# Patient Record
Sex: Female | Born: 1940 | Race: White | Hispanic: No | Smoking: Current some day smoker
Health system: Southern US, Community
[De-identification: ages and names within clinical notes are randomized; demographics above are authoritative.]

## PROBLEM LIST (undated history)

## (undated) DIAGNOSIS — I10 Essential (primary) hypertension: Secondary | ICD-10-CM

## (undated) DIAGNOSIS — J189 Pneumonia, unspecified organism: Secondary | ICD-10-CM

## (undated) DIAGNOSIS — C439 Malignant melanoma of skin, unspecified: Secondary | ICD-10-CM

## (undated) DIAGNOSIS — G40909 Epilepsy, unspecified, not intractable, without status epilepticus: Secondary | ICD-10-CM

## (undated) DIAGNOSIS — I319 Disease of pericardium, unspecified: Secondary | ICD-10-CM

## (undated) DIAGNOSIS — F32A Depression, unspecified: Secondary | ICD-10-CM

## (undated) DIAGNOSIS — E785 Hyperlipidemia, unspecified: Secondary | ICD-10-CM

## (undated) DIAGNOSIS — C4371 Malignant melanoma of right lower limb, including hip: Secondary | ICD-10-CM

## (undated) DIAGNOSIS — R296 Repeated falls: Secondary | ICD-10-CM

## (undated) DIAGNOSIS — F329 Major depressive disorder, single episode, unspecified: Secondary | ICD-10-CM

## (undated) DIAGNOSIS — C799 Secondary malignant neoplasm of unspecified site: Secondary | ICD-10-CM

## (undated) DIAGNOSIS — C55 Malignant neoplasm of uterus, part unspecified: Secondary | ICD-10-CM

## (undated) DIAGNOSIS — K900 Celiac disease: Secondary | ICD-10-CM

## (undated) DIAGNOSIS — G43909 Migraine, unspecified, not intractable, without status migrainosus: Secondary | ICD-10-CM

## (undated) DIAGNOSIS — R42 Dizziness and giddiness: Secondary | ICD-10-CM

## (undated) DIAGNOSIS — K56609 Unspecified intestinal obstruction, unspecified as to partial versus complete obstruction: Secondary | ICD-10-CM

## (undated) DIAGNOSIS — G629 Polyneuropathy, unspecified: Secondary | ICD-10-CM

## (undated) DIAGNOSIS — G8929 Other chronic pain: Secondary | ICD-10-CM

## (undated) HISTORY — DX: Depression, unspecified: F32.A

## (undated) HISTORY — PX: CHOLECYSTECTOMY: SHX55

## (undated) HISTORY — PX: ABDOMINAL HYSTERECTOMY: SHX81

## (undated) HISTORY — PX: KIDNEY STONE SURGERY: SHX686

## (undated) HISTORY — DX: Essential (primary) hypertension: I10

## (undated) HISTORY — DX: Major depressive disorder, single episode, unspecified: F32.9

## (undated) HISTORY — PX: BLADDER REPAIR: SHX76

## (undated) HISTORY — DX: Repeated falls: R29.6

## (undated) HISTORY — DX: Epilepsy, unspecified, not intractable, without status epilepticus: G40.909

## (undated) HISTORY — DX: Unspecified intestinal obstruction, unspecified as to partial versus complete obstruction: K56.609

## (undated) HISTORY — DX: Hyperlipidemia, unspecified: E78.5

## (undated) HISTORY — PX: BACK SURGERY: SHX140

## (undated) HISTORY — DX: Malignant melanoma of skin, unspecified: C43.9

## (undated) HISTORY — PX: APPENDECTOMY: SHX54

## (undated) HISTORY — DX: Malignant melanoma of right lower limb, including hip: C43.71

## (undated) HISTORY — DX: Other chronic pain: G89.29

## (undated) HISTORY — DX: Malignant neoplasm of uterus, part unspecified: C55

## (undated) HISTORY — DX: Secondary malignant neoplasm of unspecified site: C79.9

---

## 1999-06-23 ENCOUNTER — Encounter: Payer: Self-pay | Admitting: Internal Medicine

## 2003-12-16 ENCOUNTER — Ambulatory Visit: Payer: Self-pay | Admitting: Anesthesiology

## 2004-01-13 ENCOUNTER — Ambulatory Visit: Payer: Self-pay | Admitting: Anesthesiology

## 2004-02-12 ENCOUNTER — Ambulatory Visit: Payer: Self-pay | Admitting: Anesthesiology

## 2004-03-16 ENCOUNTER — Ambulatory Visit: Payer: Self-pay | Admitting: Anesthesiology

## 2004-03-25 ENCOUNTER — Ambulatory Visit: Payer: Self-pay | Admitting: Internal Medicine

## 2004-04-12 ENCOUNTER — Ambulatory Visit: Payer: Self-pay | Admitting: Anesthesiology

## 2004-05-10 ENCOUNTER — Ambulatory Visit: Payer: Self-pay | Admitting: Anesthesiology

## 2004-05-26 ENCOUNTER — Ambulatory Visit: Payer: Self-pay | Admitting: Anesthesiology

## 2004-06-17 ENCOUNTER — Ambulatory Visit: Payer: Self-pay | Admitting: Anesthesiology

## 2004-07-05 ENCOUNTER — Ambulatory Visit: Payer: Self-pay | Admitting: Anesthesiology

## 2004-07-06 ENCOUNTER — Ambulatory Visit: Payer: Self-pay | Admitting: Internal Medicine

## 2004-08-04 ENCOUNTER — Ambulatory Visit: Payer: Self-pay | Admitting: Anesthesiology

## 2004-09-09 ENCOUNTER — Ambulatory Visit: Payer: Self-pay | Admitting: Anesthesiology

## 2004-10-06 ENCOUNTER — Ambulatory Visit: Payer: Self-pay | Admitting: Anesthesiology

## 2004-10-27 ENCOUNTER — Ambulatory Visit: Payer: Self-pay | Admitting: Anesthesiology

## 2004-11-23 ENCOUNTER — Ambulatory Visit: Payer: Self-pay | Admitting: Anesthesiology

## 2004-12-15 ENCOUNTER — Ambulatory Visit: Payer: Self-pay | Admitting: Anesthesiology

## 2005-01-18 ENCOUNTER — Ambulatory Visit: Payer: Self-pay | Admitting: Anesthesiology

## 2005-02-15 ENCOUNTER — Ambulatory Visit: Payer: Self-pay | Admitting: Anesthesiology

## 2005-03-01 ENCOUNTER — Ambulatory Visit: Payer: Self-pay | Admitting: Anesthesiology

## 2005-04-12 ENCOUNTER — Ambulatory Visit: Payer: Self-pay | Admitting: Anesthesiology

## 2005-05-04 ENCOUNTER — Ambulatory Visit: Payer: Self-pay | Admitting: Anesthesiology

## 2005-06-01 ENCOUNTER — Ambulatory Visit: Payer: Self-pay | Admitting: Anesthesiology

## 2005-06-23 ENCOUNTER — Ambulatory Visit: Payer: Self-pay | Admitting: Anesthesiology

## 2005-07-25 ENCOUNTER — Ambulatory Visit: Payer: Self-pay | Admitting: Anesthesiology

## 2005-08-12 ENCOUNTER — Ambulatory Visit: Payer: Self-pay | Admitting: Anesthesiology

## 2005-09-06 ENCOUNTER — Ambulatory Visit: Payer: Self-pay | Admitting: Anesthesiology

## 2005-09-29 ENCOUNTER — Ambulatory Visit: Payer: Self-pay | Admitting: Anesthesiology

## 2005-10-26 ENCOUNTER — Inpatient Hospital Stay: Payer: Self-pay | Admitting: Internal Medicine

## 2005-11-01 ENCOUNTER — Ambulatory Visit: Payer: Self-pay | Admitting: Anesthesiology

## 2005-11-30 ENCOUNTER — Ambulatory Visit: Payer: Self-pay | Admitting: Anesthesiology

## 2005-12-05 ENCOUNTER — Ambulatory Visit: Payer: Self-pay | Admitting: Internal Medicine

## 2005-12-20 ENCOUNTER — Ambulatory Visit: Payer: Self-pay | Admitting: Anesthesiology

## 2005-12-28 ENCOUNTER — Encounter: Payer: Self-pay | Admitting: Internal Medicine

## 2006-01-18 ENCOUNTER — Ambulatory Visit: Payer: Self-pay | Admitting: Anesthesiology

## 2006-02-06 ENCOUNTER — Ambulatory Visit: Payer: Self-pay | Admitting: Internal Medicine

## 2006-02-16 ENCOUNTER — Ambulatory Visit: Payer: Self-pay | Admitting: Anesthesiology

## 2006-02-28 ENCOUNTER — Ambulatory Visit: Payer: Self-pay | Admitting: Anesthesiology

## 2006-03-13 ENCOUNTER — Ambulatory Visit: Payer: Self-pay | Admitting: Family Medicine

## 2006-03-20 ENCOUNTER — Ambulatory Visit: Payer: Self-pay | Admitting: Internal Medicine

## 2006-03-22 ENCOUNTER — Encounter: Payer: Self-pay | Admitting: Internal Medicine

## 2006-04-06 ENCOUNTER — Ambulatory Visit: Payer: Self-pay | Admitting: Internal Medicine

## 2006-04-08 ENCOUNTER — Inpatient Hospital Stay: Payer: Self-pay | Admitting: Unknown Physician Specialty

## 2006-04-19 ENCOUNTER — Ambulatory Visit: Payer: Self-pay | Admitting: Anesthesiology

## 2006-04-27 ENCOUNTER — Ambulatory Visit: Payer: Self-pay | Admitting: Internal Medicine

## 2006-05-17 ENCOUNTER — Ambulatory Visit: Payer: Self-pay | Admitting: Anesthesiology

## 2006-05-31 ENCOUNTER — Ambulatory Visit: Payer: Self-pay | Admitting: Internal Medicine

## 2006-05-31 LAB — CONVERTED CEMR LAB
CO2: 31 meq/L (ref 19–32)
Cholesterol: 175 mg/dL (ref 0–200)
GFR calc Af Amer: 107 mL/min
Glucose, Bld: 101 mg/dL — ABNORMAL HIGH (ref 70–99)
HDL: 32.9 mg/dL — ABNORMAL LOW (ref >39.0)
Phosphorus: 4.1 mg/dL (ref 2.3–4.6)
Potassium: 4.3 meq/L (ref 3.5–5.1)
Sodium: 141 meq/L (ref 135–145)
Total CHOL/HDL Ratio: 5.3
Triglycerides: 260 mg/dL (ref 0–149)
VLDL: 52 mg/dL — ABNORMAL HIGH (ref 0–40)

## 2006-06-13 ENCOUNTER — Ambulatory Visit: Payer: Self-pay | Admitting: Anesthesiology

## 2006-06-28 ENCOUNTER — Ambulatory Visit: Payer: Self-pay | Admitting: Anesthesiology

## 2006-08-08 ENCOUNTER — Ambulatory Visit: Payer: Self-pay | Admitting: Anesthesiology

## 2006-08-08 ENCOUNTER — Encounter: Payer: Self-pay | Admitting: Internal Medicine

## 2006-08-29 DIAGNOSIS — Z8542 Personal history of malignant neoplasm of other parts of uterus: Secondary | ICD-10-CM | POA: Insufficient documentation

## 2006-08-29 DIAGNOSIS — I10 Essential (primary) hypertension: Secondary | ICD-10-CM

## 2006-08-29 DIAGNOSIS — E785 Hyperlipidemia, unspecified: Secondary | ICD-10-CM

## 2006-08-29 DIAGNOSIS — F39 Unspecified mood [affective] disorder: Secondary | ICD-10-CM

## 2006-08-29 DIAGNOSIS — G8929 Other chronic pain: Secondary | ICD-10-CM | POA: Insufficient documentation

## 2006-08-29 DIAGNOSIS — M810 Age-related osteoporosis without current pathological fracture: Secondary | ICD-10-CM

## 2006-09-02 DIAGNOSIS — E1142 Type 2 diabetes mellitus with diabetic polyneuropathy: Secondary | ICD-10-CM

## 2006-09-05 ENCOUNTER — Encounter: Payer: Self-pay | Admitting: Internal Medicine

## 2006-09-05 ENCOUNTER — Ambulatory Visit: Payer: Self-pay | Admitting: Anesthesiology

## 2006-09-13 ENCOUNTER — Ambulatory Visit: Payer: Self-pay | Admitting: Internal Medicine

## 2006-10-02 ENCOUNTER — Encounter: Payer: Self-pay | Admitting: Internal Medicine

## 2006-10-02 ENCOUNTER — Ambulatory Visit: Payer: Self-pay | Admitting: Anesthesiology

## 2006-11-02 ENCOUNTER — Ambulatory Visit: Payer: Self-pay | Admitting: Anesthesiology

## 2006-11-02 ENCOUNTER — Encounter: Payer: Self-pay | Admitting: Internal Medicine

## 2006-11-15 ENCOUNTER — Encounter: Payer: Self-pay | Admitting: Internal Medicine

## 2006-11-15 ENCOUNTER — Ambulatory Visit: Payer: Self-pay | Admitting: Anesthesiology

## 2006-12-22 ENCOUNTER — Ambulatory Visit: Payer: Self-pay | Admitting: Anesthesiology

## 2006-12-26 ENCOUNTER — Encounter: Payer: Self-pay | Admitting: Internal Medicine

## 2007-01-22 ENCOUNTER — Encounter: Payer: Self-pay | Admitting: Internal Medicine

## 2007-01-22 ENCOUNTER — Ambulatory Visit: Payer: Self-pay | Admitting: Anesthesiology

## 2007-02-20 ENCOUNTER — Ambulatory Visit: Payer: Self-pay | Admitting: Anesthesiology

## 2007-02-20 ENCOUNTER — Encounter: Payer: Self-pay | Admitting: Internal Medicine

## 2007-03-19 ENCOUNTER — Encounter: Payer: Self-pay | Admitting: Internal Medicine

## 2007-03-19 ENCOUNTER — Ambulatory Visit: Payer: Self-pay | Admitting: Anesthesiology

## 2007-04-17 ENCOUNTER — Ambulatory Visit: Payer: Self-pay | Admitting: Anesthesiology

## 2007-04-17 ENCOUNTER — Encounter: Payer: Self-pay | Admitting: Internal Medicine

## 2007-05-03 ENCOUNTER — Ambulatory Visit: Payer: Self-pay | Admitting: Internal Medicine

## 2007-05-03 LAB — CONVERTED CEMR LAB
Blood in Urine, dipstick: NEGATIVE
Glucose, Urine, Semiquant: NEGATIVE
Ketones, urine, test strip: NEGATIVE
Nitrite: POSITIVE

## 2007-05-04 LAB — CONVERTED CEMR LAB
ALT: 33 units/L (ref 0–35)
Albumin: 4.1 g/dL (ref 3.5–5.2)
Alkaline Phosphatase: 105 units/L (ref 39–117)
Basophils Relative: 0.4 % (ref 0.0–1.0)
CO2: 30 meq/L (ref 19–32)
Calcium: 10.2 mg/dL (ref 8.4–10.5)
Chloride: 95 meq/L — ABNORMAL LOW (ref 96–112)
Cholesterol: 193 mg/dL (ref 0–200)
Direct LDL: 85.8 mg/dL
Eosinophils Absolute: 0.3 10*3/uL (ref 0.0–0.6)
Eosinophils Relative: 2.3 % (ref 0.0–5.0)
Glucose, Bld: 178 mg/dL — ABNORMAL HIGH (ref 70–99)
HDL: 30.4 mg/dL — ABNORMAL LOW (ref >39.0)
Hgb A1c MFr Bld: 7.1 % — ABNORMAL HIGH (ref 4.6–6.0)
Lymphocytes Relative: 28.1 % (ref 12.0–46.0)
MCV: 94.2 fL (ref 78.0–100.0)
Microalb Creat Ratio: 63.6 mg/g — ABNORMAL HIGH (ref 0.0–30.0)
Microalb, Ur: 8 mg/dL — ABNORMAL HIGH (ref 0.0–1.9)
Monocytes Relative: 6.8 % (ref 3.0–11.0)
Neutro Abs: 7 10*3/uL (ref 1.4–7.7)
Platelets: 323 10*3/uL (ref 150–400)
RBC: 4.74 M/uL (ref 3.87–5.11)
Sodium: 136 meq/L (ref 135–145)
TSH: 1.99 microintl units/mL (ref 0.35–5.50)
Total Protein: 7.8 g/dL (ref 6.0–8.3)
Triglycerides: 533 mg/dL (ref 0–149)
VLDL: 107 mg/dL — ABNORMAL HIGH (ref 0–40)
WBC: 11.3 10*3/uL — ABNORMAL HIGH (ref 4.5–10.5)

## 2007-05-14 ENCOUNTER — Ambulatory Visit: Payer: Self-pay | Admitting: Anesthesiology

## 2007-05-14 ENCOUNTER — Encounter: Payer: Self-pay | Admitting: Internal Medicine

## 2007-05-17 ENCOUNTER — Telehealth (INDEPENDENT_AMBULATORY_CARE_PROVIDER_SITE_OTHER): Payer: Self-pay | Admitting: *Deleted

## 2007-05-17 ENCOUNTER — Ambulatory Visit: Payer: Self-pay | Admitting: Unknown Physician Specialty

## 2007-05-23 ENCOUNTER — Telehealth: Payer: Self-pay | Admitting: Internal Medicine

## 2007-05-29 ENCOUNTER — Encounter: Payer: Self-pay | Admitting: Internal Medicine

## 2007-06-11 ENCOUNTER — Ambulatory Visit: Payer: Self-pay | Admitting: Anesthesiology

## 2007-06-11 ENCOUNTER — Encounter: Payer: Self-pay | Admitting: Internal Medicine

## 2007-06-29 ENCOUNTER — Telehealth: Payer: Self-pay | Admitting: Family Medicine

## 2007-07-04 ENCOUNTER — Encounter: Payer: Self-pay | Admitting: Internal Medicine

## 2007-07-04 ENCOUNTER — Ambulatory Visit: Payer: Self-pay | Admitting: Anesthesiology

## 2007-07-26 ENCOUNTER — Telehealth (INDEPENDENT_AMBULATORY_CARE_PROVIDER_SITE_OTHER): Payer: Self-pay | Admitting: *Deleted

## 2007-08-01 ENCOUNTER — Ambulatory Visit: Payer: Self-pay | Admitting: Anesthesiology

## 2007-08-01 ENCOUNTER — Encounter: Payer: Self-pay | Admitting: Internal Medicine

## 2007-08-28 ENCOUNTER — Ambulatory Visit: Payer: Self-pay | Admitting: Anesthesiology

## 2007-09-27 ENCOUNTER — Encounter: Payer: Self-pay | Admitting: Internal Medicine

## 2007-09-27 ENCOUNTER — Ambulatory Visit: Payer: Self-pay | Admitting: Anesthesiology

## 2007-10-22 ENCOUNTER — Encounter: Payer: Self-pay | Admitting: Internal Medicine

## 2007-10-22 ENCOUNTER — Ambulatory Visit: Payer: Self-pay | Admitting: Anesthesiology

## 2007-11-05 ENCOUNTER — Ambulatory Visit: Payer: Self-pay | Admitting: Internal Medicine

## 2007-11-05 DIAGNOSIS — R0609 Other forms of dyspnea: Secondary | ICD-10-CM

## 2007-11-05 DIAGNOSIS — R0989 Other specified symptoms and signs involving the circulatory and respiratory systems: Secondary | ICD-10-CM

## 2007-11-08 ENCOUNTER — Ambulatory Visit: Payer: Self-pay | Admitting: Cardiovascular Disease

## 2007-11-08 LAB — CONVERTED CEMR LAB
ALT: 32 U/L (ref 0–35)
AST: 29 U/L (ref 0–37)
Albumin: 4.3 g/dL (ref 3.5–5.2)
Alkaline Phosphatase: 92 U/L (ref 39–117)
BUN: 18 mg/dL (ref 6–23)
Basophils Absolute: 0 K/uL (ref 0.0–0.1)
Basophils Relative: 0.3 % (ref 0.0–3.0)
Bilirubin, Direct: 0.1 mg/dL (ref 0.0–0.3)
CO2: 32 meq/L (ref 19–32)
Calcium: 10 mg/dL (ref 8.4–10.5)
Chloride: 105 meq/L (ref 96–112)
Cholesterol: 227 mg/dL (ref 0–200)
Creatinine, Ser: 0.7 mg/dL (ref 0.4–1.2)
Direct LDL: 68.8 mg/dL
Eosinophils Absolute: 0.3 K/uL (ref 0.0–0.7)
Eosinophils Relative: 2.5 % (ref 0.0–5.0)
Free T4: 0.6 ng/dL (ref 0.6–1.6)
GFR calc Af Amer: 107 mL/min
GFR calc non Af Amer: 88 mL/min
Glucose, Bld: 150 mg/dL — ABNORMAL HIGH (ref 70–99)
HCT: 40.3 % (ref 36.0–46.0)
HDL: 33.1 mg/dL — ABNORMAL LOW (ref >39.0)
Hemoglobin: 14 g/dL (ref 12.0–15.0)
Hgb A1c MFr Bld: 8.2 % — ABNORMAL HIGH (ref 4.6–6.0)
Lymphocytes Relative: 32.4 % (ref 12.0–46.0)
MCHC: 34.9 g/dL (ref 30.0–36.0)
MCV: 93.6 fL (ref 78.0–100.0)
Monocytes Absolute: 0.5 K/uL (ref 0.1–1.0)
Monocytes Relative: 5 % (ref 3.0–12.0)
Neutro Abs: 6.6 K/uL (ref 1.4–7.7)
Neutrophils Relative %: 59.8 % (ref 43.0–77.0)
Phosphorus: 4.8 mg/dL — ABNORMAL HIGH (ref 2.3–4.6)
Platelets: 293 K/uL (ref 150–400)
Potassium: 4.9 meq/L (ref 3.5–5.1)
RBC: 4.3 M/uL (ref 3.87–5.11)
RDW: 12 % (ref 11.5–14.6)
Sodium: 140 meq/L (ref 135–145)
TSH: 2.85 u[IU]/mL (ref 0.35–5.50)
Total Bilirubin: 0.6 mg/dL (ref 0.3–1.2)
Total CHOL/HDL Ratio: 6.9
Total Protein: 7.6 g/dL (ref 6.0–8.3)
Triglycerides: 724 mg/dL (ref 0–149)
VLDL: 145 mg/dL — ABNORMAL HIGH (ref 0–40)
WBC: 10.9 10*3/microliter — ABNORMAL HIGH (ref 4.5–10.5)

## 2007-11-14 ENCOUNTER — Encounter: Payer: Self-pay | Admitting: Cardiovascular Disease

## 2007-11-14 ENCOUNTER — Ambulatory Visit: Payer: Self-pay

## 2007-11-16 ENCOUNTER — Ambulatory Visit: Payer: Self-pay | Admitting: Anesthesiology

## 2007-11-16 ENCOUNTER — Encounter: Payer: Self-pay | Admitting: Internal Medicine

## 2007-12-18 ENCOUNTER — Ambulatory Visit: Payer: Self-pay | Admitting: Anesthesiology

## 2007-12-18 ENCOUNTER — Encounter: Payer: Self-pay | Admitting: Internal Medicine

## 2008-01-15 ENCOUNTER — Ambulatory Visit: Payer: Self-pay | Admitting: Anesthesiology

## 2008-01-15 ENCOUNTER — Encounter: Payer: Self-pay | Admitting: Internal Medicine

## 2008-02-04 ENCOUNTER — Telehealth (INDEPENDENT_AMBULATORY_CARE_PROVIDER_SITE_OTHER): Payer: Self-pay | Admitting: *Deleted

## 2008-02-11 ENCOUNTER — Ambulatory Visit: Payer: Self-pay | Admitting: Anesthesiology

## 2008-02-12 ENCOUNTER — Encounter: Payer: Self-pay | Admitting: Internal Medicine

## 2008-02-26 ENCOUNTER — Telehealth: Payer: Self-pay | Admitting: Family Medicine

## 2008-03-05 ENCOUNTER — Ambulatory Visit: Payer: Self-pay | Admitting: Anesthesiology

## 2008-03-08 ENCOUNTER — Telehealth: Payer: Self-pay | Admitting: Family Medicine

## 2008-03-12 ENCOUNTER — Ambulatory Visit: Payer: Self-pay | Admitting: Internal Medicine

## 2008-04-03 ENCOUNTER — Encounter: Payer: Self-pay | Admitting: Internal Medicine

## 2008-04-03 ENCOUNTER — Ambulatory Visit: Payer: Self-pay | Admitting: Anesthesiology

## 2008-04-11 ENCOUNTER — Inpatient Hospital Stay: Payer: Self-pay | Admitting: Internal Medicine

## 2008-04-11 ENCOUNTER — Encounter: Payer: Self-pay | Admitting: Internal Medicine

## 2008-04-15 ENCOUNTER — Encounter: Payer: Self-pay | Admitting: Internal Medicine

## 2008-04-23 ENCOUNTER — Encounter: Payer: Self-pay | Admitting: Internal Medicine

## 2008-04-23 ENCOUNTER — Telehealth: Payer: Self-pay | Admitting: Internal Medicine

## 2008-05-13 ENCOUNTER — Ambulatory Visit: Payer: Self-pay | Admitting: Internal Medicine

## 2008-05-22 ENCOUNTER — Encounter: Payer: Self-pay | Admitting: Internal Medicine

## 2008-05-22 ENCOUNTER — Ambulatory Visit: Payer: Self-pay | Admitting: Anesthesiology

## 2008-06-09 ENCOUNTER — Telehealth: Payer: Self-pay | Admitting: Internal Medicine

## 2008-06-16 ENCOUNTER — Ambulatory Visit: Payer: Self-pay | Admitting: Internal Medicine

## 2008-06-16 DIAGNOSIS — L0201 Cutaneous abscess of face: Secondary | ICD-10-CM

## 2008-06-16 DIAGNOSIS — L03211 Cellulitis of face: Secondary | ICD-10-CM

## 2008-06-24 ENCOUNTER — Encounter: Payer: Self-pay | Admitting: Internal Medicine

## 2008-06-24 ENCOUNTER — Ambulatory Visit: Payer: Self-pay | Admitting: Anesthesiology

## 2008-06-25 ENCOUNTER — Encounter: Payer: Self-pay | Admitting: Cardiovascular Disease

## 2008-06-25 ENCOUNTER — Encounter: Payer: Self-pay | Admitting: Internal Medicine

## 2008-06-27 ENCOUNTER — Encounter: Payer: Self-pay | Admitting: Internal Medicine

## 2008-07-01 ENCOUNTER — Ambulatory Visit: Payer: Self-pay | Admitting: Unknown Physician Specialty

## 2008-07-15 ENCOUNTER — Telehealth: Payer: Self-pay | Admitting: Internal Medicine

## 2008-07-16 ENCOUNTER — Encounter: Payer: Self-pay | Admitting: Internal Medicine

## 2008-07-16 ENCOUNTER — Ambulatory Visit: Payer: Self-pay | Admitting: Anesthesiology

## 2008-07-29 ENCOUNTER — Encounter: Payer: Self-pay | Admitting: Internal Medicine

## 2008-08-21 ENCOUNTER — Ambulatory Visit: Payer: Self-pay | Admitting: Anesthesiology

## 2008-08-21 ENCOUNTER — Encounter: Payer: Self-pay | Admitting: Internal Medicine

## 2008-08-25 ENCOUNTER — Telehealth: Payer: Self-pay | Admitting: Internal Medicine

## 2008-09-25 ENCOUNTER — Telehealth: Payer: Self-pay | Admitting: Internal Medicine

## 2008-10-14 ENCOUNTER — Encounter: Payer: Self-pay | Admitting: Internal Medicine

## 2008-10-14 ENCOUNTER — Ambulatory Visit: Payer: Self-pay | Admitting: Gastroenterology

## 2008-10-21 ENCOUNTER — Ambulatory Visit: Payer: Self-pay | Admitting: Anesthesiology

## 2008-10-21 ENCOUNTER — Encounter: Payer: Self-pay | Admitting: Internal Medicine

## 2008-10-22 ENCOUNTER — Ambulatory Visit: Payer: Self-pay | Admitting: Anesthesiology

## 2008-10-23 ENCOUNTER — Ambulatory Visit: Payer: Self-pay | Admitting: Cardiology

## 2008-10-23 ENCOUNTER — Inpatient Hospital Stay: Payer: Self-pay | Admitting: Vascular Surgery

## 2008-10-27 ENCOUNTER — Telehealth: Payer: Self-pay | Admitting: Internal Medicine

## 2008-10-28 ENCOUNTER — Encounter: Payer: Self-pay | Admitting: Internal Medicine

## 2008-10-30 ENCOUNTER — Encounter: Payer: Self-pay | Admitting: Internal Medicine

## 2008-10-30 ENCOUNTER — Other Ambulatory Visit: Payer: Self-pay

## 2008-11-12 ENCOUNTER — Ambulatory Visit: Payer: Self-pay | Admitting: Internal Medicine

## 2008-11-20 ENCOUNTER — Encounter: Payer: Self-pay | Admitting: Internal Medicine

## 2008-12-12 ENCOUNTER — Ambulatory Visit: Payer: Self-pay | Admitting: Internal Medicine

## 2009-01-01 ENCOUNTER — Ambulatory Visit: Payer: Self-pay | Admitting: Internal Medicine

## 2009-01-01 DIAGNOSIS — R42 Dizziness and giddiness: Secondary | ICD-10-CM

## 2009-02-12 ENCOUNTER — Encounter: Payer: Self-pay | Admitting: Internal Medicine

## 2009-02-18 ENCOUNTER — Ambulatory Visit: Payer: Self-pay | Admitting: Anesthesiology

## 2009-03-04 ENCOUNTER — Telehealth: Payer: Self-pay | Admitting: Internal Medicine

## 2009-03-05 ENCOUNTER — Telehealth: Payer: Self-pay | Admitting: Internal Medicine

## 2009-03-05 ENCOUNTER — Ambulatory Visit: Payer: Self-pay | Admitting: Internal Medicine

## 2009-03-17 ENCOUNTER — Telehealth: Payer: Self-pay | Admitting: Family Medicine

## 2009-04-02 ENCOUNTER — Ambulatory Visit: Payer: Self-pay | Admitting: Anesthesiology

## 2009-04-02 ENCOUNTER — Encounter: Payer: Self-pay | Admitting: Internal Medicine

## 2009-04-22 ENCOUNTER — Encounter: Payer: Self-pay | Admitting: Internal Medicine

## 2009-04-29 ENCOUNTER — Telehealth: Payer: Self-pay | Admitting: Internal Medicine

## 2009-05-11 ENCOUNTER — Ambulatory Visit: Payer: Self-pay | Admitting: Internal Medicine

## 2009-05-12 LAB — CONVERTED CEMR LAB
Albumin: 4.3 g/dL (ref 3.5–5.2)
Basophils Relative: 0.8 % (ref 0.0–3.0)
CO2: 30 meq/L (ref 19–32)
Chloride: 106 meq/L (ref 96–112)
Cholesterol: 146 mg/dL (ref 0–200)
Direct LDL: 85.6 mg/dL
Eosinophils Relative: 4.1 % (ref 0.0–5.0)
HCT: 39.9 % (ref 36.0–46.0)
Hemoglobin: 13.2 g/dL (ref 12.0–15.0)
Lymphs Abs: 2.7 10*3/uL (ref 0.7–4.0)
MCV: 95.2 fL (ref 78.0–100.0)
Monocytes Absolute: 0.6 10*3/uL (ref 0.1–1.0)
Neutro Abs: 4.8 10*3/uL (ref 1.4–7.7)
RBC: 4.19 M/uL (ref 3.87–5.11)
Sodium: 140 meq/L (ref 135–145)
Total Bilirubin: 0.3 mg/dL (ref 0.3–1.2)
Total CHOL/HDL Ratio: 3
Triglycerides: 208 mg/dL — ABNORMAL HIGH (ref 0.0–149.0)
VLDL: 41.6 mg/dL — ABNORMAL HIGH (ref 0.0–40.0)
WBC: 8.5 10*3/uL (ref 4.5–10.5)

## 2009-05-20 ENCOUNTER — Ambulatory Visit: Payer: Self-pay | Admitting: Anesthesiology

## 2009-05-20 ENCOUNTER — Encounter: Payer: Self-pay | Admitting: Internal Medicine

## 2009-05-27 ENCOUNTER — Telehealth: Payer: Self-pay | Admitting: Internal Medicine

## 2009-06-08 ENCOUNTER — Telehealth: Payer: Self-pay | Admitting: Internal Medicine

## 2009-06-10 ENCOUNTER — Telehealth: Payer: Self-pay | Admitting: Internal Medicine

## 2009-07-07 ENCOUNTER — Ambulatory Visit: Payer: Self-pay | Admitting: Unknown Physician Specialty

## 2009-07-09 ENCOUNTER — Ambulatory Visit: Payer: Self-pay | Admitting: Anesthesiology

## 2009-07-09 ENCOUNTER — Encounter: Payer: Self-pay | Admitting: Internal Medicine

## 2009-08-25 ENCOUNTER — Telehealth: Payer: Self-pay | Admitting: Internal Medicine

## 2009-08-31 ENCOUNTER — Encounter: Payer: Self-pay | Admitting: Internal Medicine

## 2009-08-31 ENCOUNTER — Telehealth: Payer: Self-pay | Admitting: Internal Medicine

## 2009-09-21 ENCOUNTER — Ambulatory Visit: Payer: Self-pay | Admitting: Anesthesiology

## 2009-09-21 ENCOUNTER — Encounter: Payer: Self-pay | Admitting: Internal Medicine

## 2009-11-12 ENCOUNTER — Ambulatory Visit: Payer: Self-pay | Admitting: Internal Medicine

## 2009-11-18 LAB — CONVERTED CEMR LAB
BUN: 17 mg/dL (ref 6–23)
CO2: 29 meq/L (ref 19–32)
Chloride: 98 meq/L (ref 96–112)
GFR calc non Af Amer: 63.97 mL/min (ref >60)

## 2009-11-24 ENCOUNTER — Encounter: Payer: Self-pay | Admitting: Internal Medicine

## 2009-11-24 ENCOUNTER — Ambulatory Visit: Payer: Self-pay | Admitting: Anesthesiology

## 2010-01-12 ENCOUNTER — Telehealth: Payer: Self-pay | Admitting: Internal Medicine

## 2010-01-18 ENCOUNTER — Encounter: Payer: Self-pay | Admitting: Internal Medicine

## 2010-01-18 ENCOUNTER — Ambulatory Visit: Payer: Self-pay | Admitting: Anesthesiology

## 2010-01-29 ENCOUNTER — Ambulatory Visit: Payer: Self-pay | Admitting: Internal Medicine

## 2010-01-29 LAB — HM DIABETES FOOT EXAM

## 2010-02-01 LAB — CONVERTED CEMR LAB: Hgb A1c MFr Bld: 6.5 % (ref 4.6–6.5)

## 2010-02-03 ENCOUNTER — Ambulatory Visit: Payer: Self-pay | Admitting: Ophthalmology

## 2010-02-17 ENCOUNTER — Ambulatory Visit: Payer: Self-pay | Admitting: Ophthalmology

## 2010-03-01 IMAGING — CT CT HEAD WITHOUT CONTRAST
4 of 6 series · 16 of 30 positions shown, 17 images · non-contrast
Comparison: none

REASON FOR EXAM: altered mental status
COMMENTS:

PROCEDURE:     CT  - CT HEAD WITHOUT CONTRAST  - November 15, 2008  [DATE]
RESULT:     Comparison:  None
TECHNIQUE: Multiple axial images from the foramen magnum to the vertex were
obtained without IV contrast.

[Series 2: without · axial · non-contrast · 0.41mm/px · z∈[-162,-72]mm · 4 of 30 slices shown, 5 images (1 of 2)]
[im 6/30  brain]
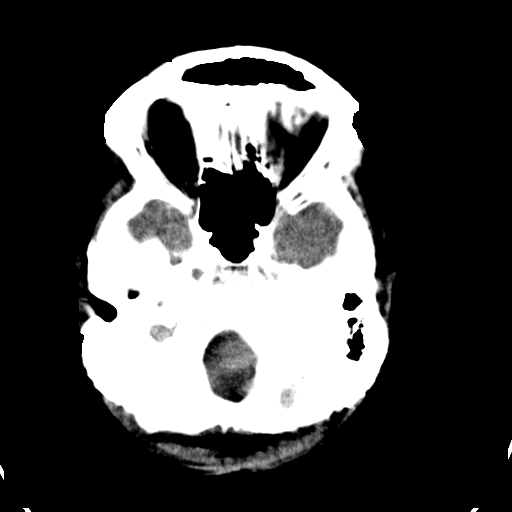
[im 6/30  bone]
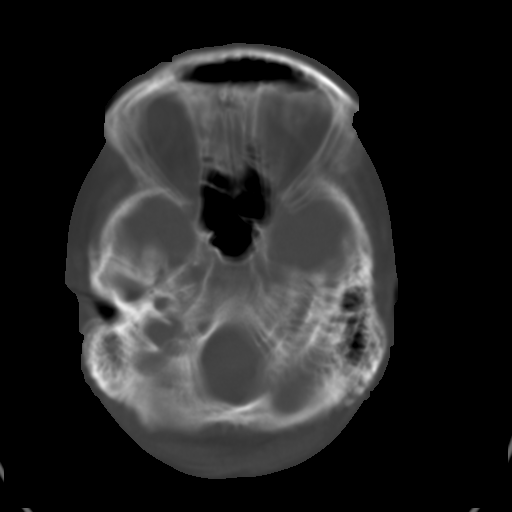
[im 12/30  brain]
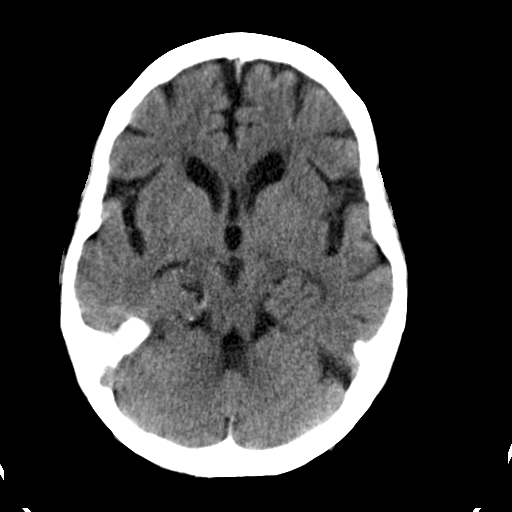
[im 18/30  brain]
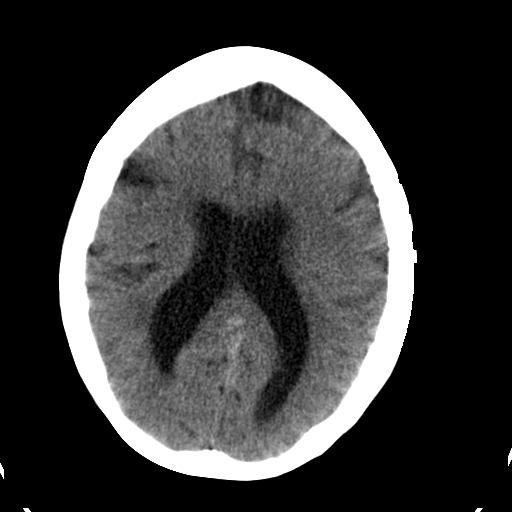
[im 24/30  brain]
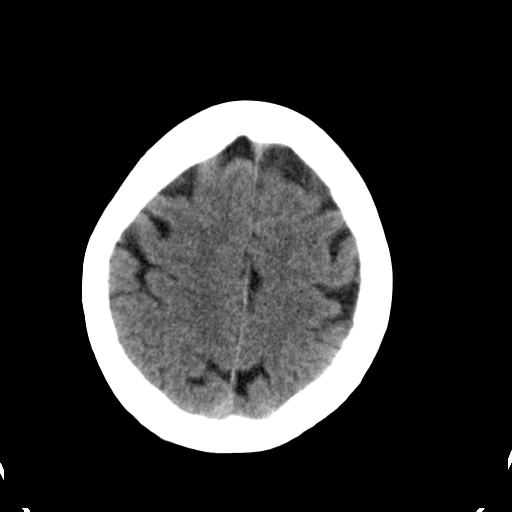

[Series 3: bone · axial · 0.41mm/px · z∈[-162,-72]mm · 4 of 30 slices shown (1 of 2)]
[im 6/30  bone]
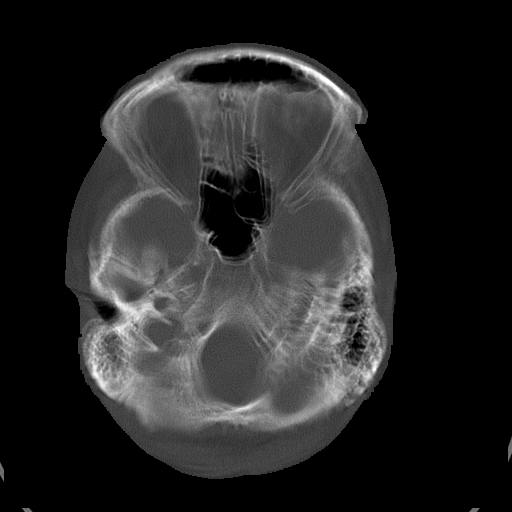
[im 12/30  bone]
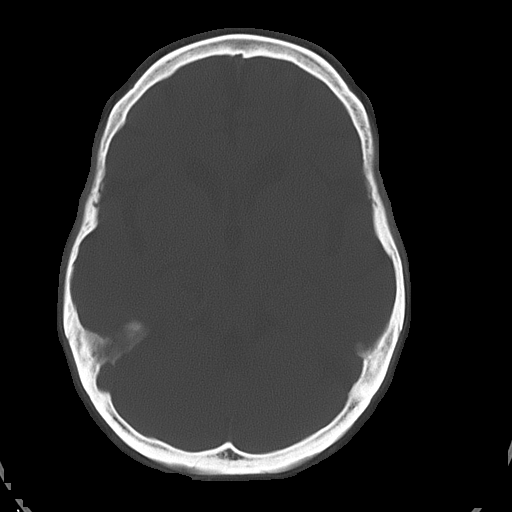
[im 18/30  bone]
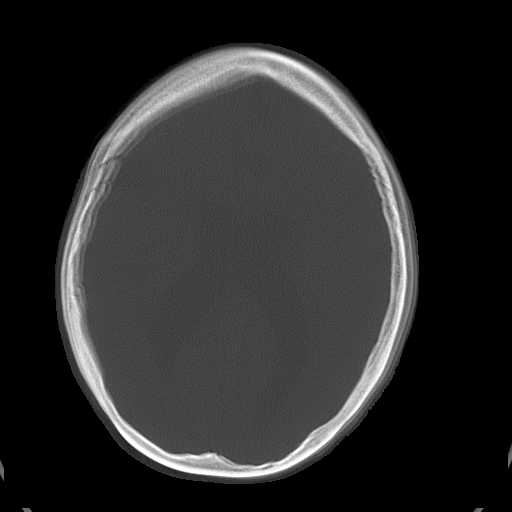
[im 24/30  bone]
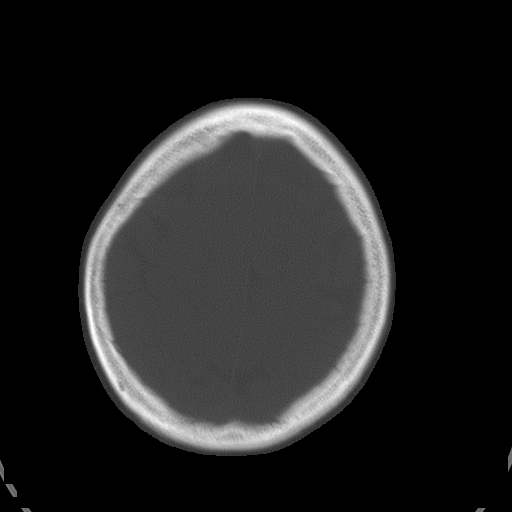

[Series 4: without · axial · non-contrast · 0.41mm/px · z∈[-162,-72]mm · 4 of 30 slices shown (2 of 2)]
[im 6/30  brain]
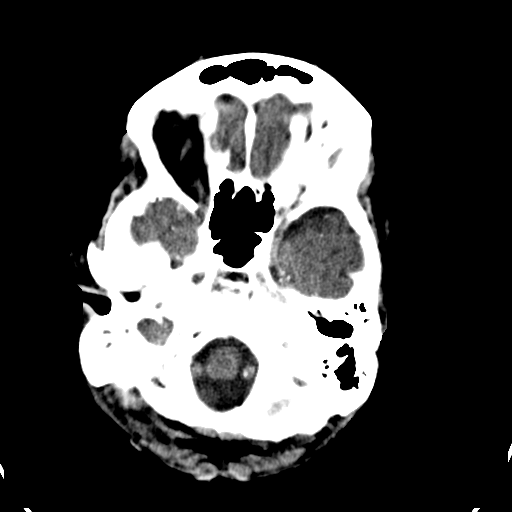
[im 12/30  brain]
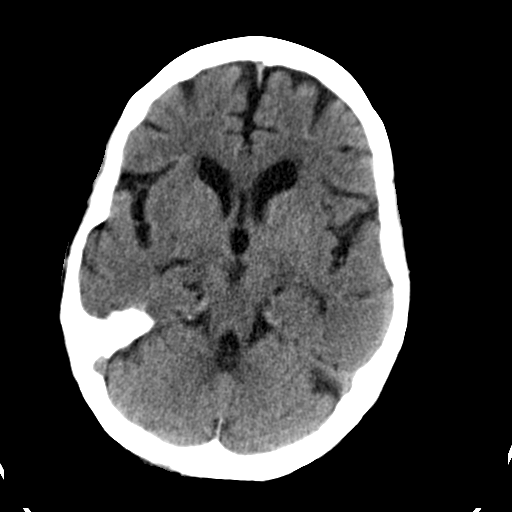
[im 18/30  brain]
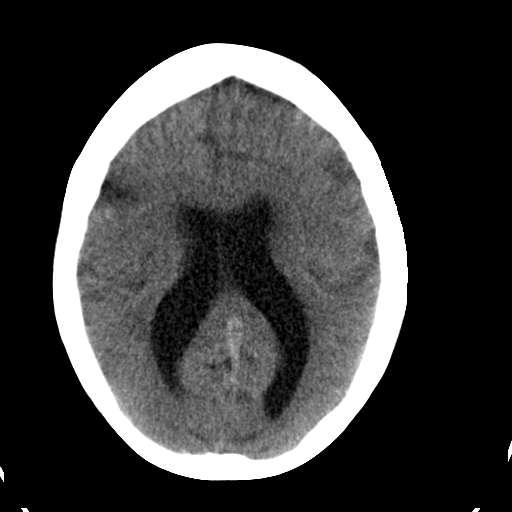
[im 24/30  brain]
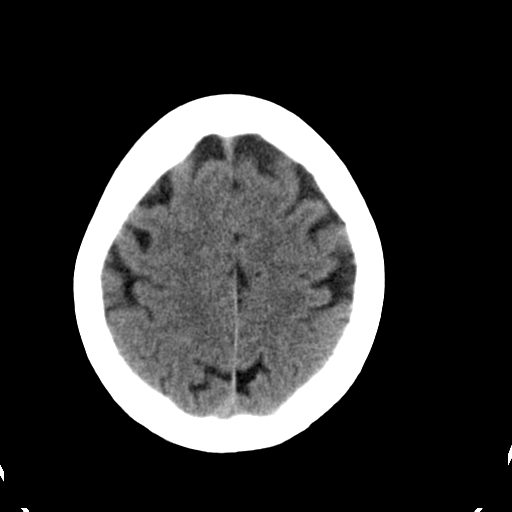

[Series 5: bone · axial · 0.41mm/px · z∈[-162,-72]mm · 4 of 30 slices shown (2 of 2)]
[im 6/30  bone]
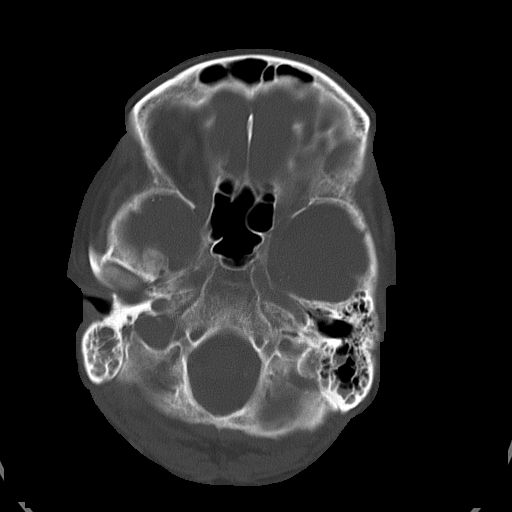
[im 12/30  bone]
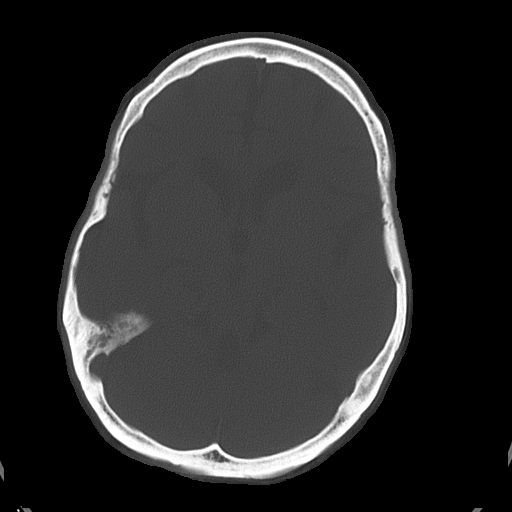
[im 18/30  bone]
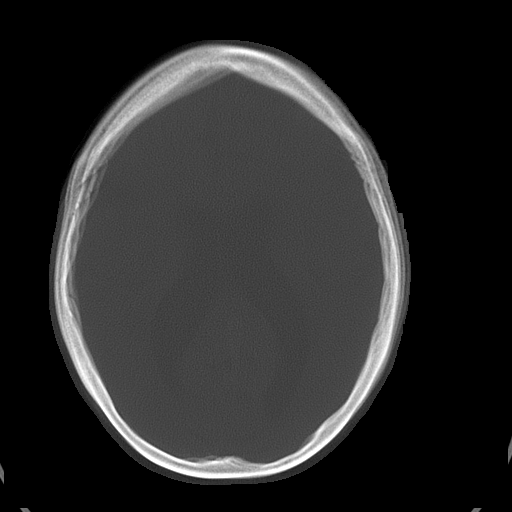
[im 24/30  bone]
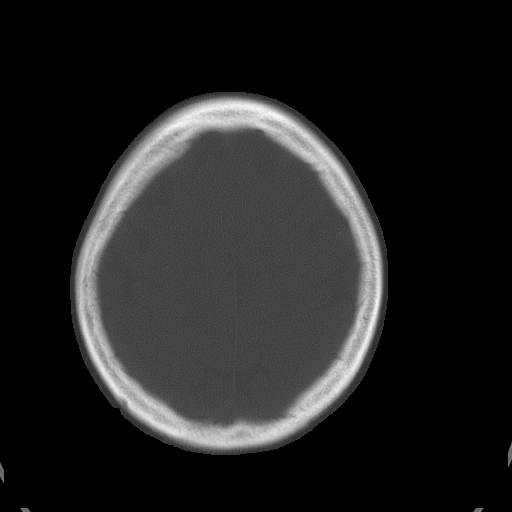

[16 of 30 positions shown; findings below may reference images not displayed]

FINDINGS: Evaluation is limited secondary to patient motion.

There is no evidence of mass effect, midline shift, or extra-axial fluid
collections.  There is no evidence of a space-occupying lesion or
intracranial hemorrhage. There is no evidence of a cortical-based area of
acute infarction. There is generalized cerebral atrophy. There is
periventricular white matter low attenuation likely secondary to
microangiopathy.

The ventricles and sulci are appropriate for the patient's age. The basal
cisterns are patent.

Visualized portions of the orbits are unremarkable. There is complete
opacification of the right mastoid sinus.  Cerebrovascular atherosclerotic
calcifications are noted.

The osseous structures are unremarkable.
IMPRESSION: No acute intracranial process.

There is complete opacification of the right mastoid sinus. Correlate with
symptoms of mastoiditis.

## 2010-03-23 ENCOUNTER — Encounter: Payer: Self-pay | Admitting: Internal Medicine

## 2010-03-23 ENCOUNTER — Ambulatory Visit: Payer: Self-pay | Admitting: Anesthesiology

## 2010-04-07 ENCOUNTER — Inpatient Hospital Stay: Payer: Self-pay | Admitting: Specialist

## 2010-04-07 ENCOUNTER — Telehealth: Payer: Self-pay | Admitting: Internal Medicine

## 2010-04-07 ENCOUNTER — Ambulatory Visit: Admit: 2010-04-07 | Payer: Self-pay | Admitting: Internal Medicine

## 2010-04-07 ENCOUNTER — Encounter: Payer: Self-pay | Admitting: Internal Medicine

## 2010-04-08 ENCOUNTER — Telehealth: Payer: Self-pay | Admitting: Internal Medicine

## 2010-04-13 DIAGNOSIS — M549 Dorsalgia, unspecified: Secondary | ICD-10-CM

## 2010-04-13 DIAGNOSIS — N390 Urinary tract infection, site not specified: Secondary | ICD-10-CM

## 2010-04-13 DIAGNOSIS — F329 Major depressive disorder, single episode, unspecified: Secondary | ICD-10-CM

## 2010-04-13 DIAGNOSIS — J189 Pneumonia, unspecified organism: Secondary | ICD-10-CM

## 2010-04-14 NOTE — Letter (Signed)
Summary: [facility]   Imported By: Edmonia James 07/21/2009 11:53:37  _____________________________________________________________________  External Attachment:    Type:   Image     Comment:   External Document  Appended Document: [place] doing well No changes

## 2010-04-14 NOTE — Progress Notes (Signed)
Summary: refill request for diazepam  Phone Note Refill Request Message from:  Fax from Pharmacy  Refills Requested: Medication #1:  DIAZEPAM 5 MG TABS take 1-2 by mouth as needed   Last Refilled: 04/29/2009 [facility] [address] [facility] on your desk.  Initial call taken by: Marty Heck CMA,  May 27, 2009 11:07 AM  Follow-up for Phone Call        okay #60 x 0 Follow-up by: Claris Gower MD,  May 27, 2009 2:01 PM  Additional Follow-up for Phone Call Additional follow up Details #1::        [facility] Additional Follow-up by: Edwin Dada CMA Deborra Medina),  May 27, 2009 2:10 PM    Prescriptions: DIAZEPAM 5 MG TABS (DIAZEPAM) take 1-2 by mouth as needed  #60 x 0   Entered by:   Edwin Dada CMA ([place])   Authorized by:   Claris Gower MD   Signed by:   Edwin Dada CMA ([place]) on 05/27/2009   Method used:   Handwritten   RxID:   [id number]

## 2010-04-14 NOTE — Letter (Signed)
Summary: [facility]  [facility]   Imported By: Edmonia James 05/06/2009 12:49:17  _____________________________________________________________________  External Attachment:    Type:   Image     Comment:   External Document  Appended Document: [facility] post op evaluation doing well as needed follow up

## 2010-04-14 NOTE — Progress Notes (Signed)
Summary: Rx Diazepam  Phone Note Refill Request Call back at [phone] Message [facility] on April 29, 2009 3:57 PM  Refills Requested: Medication #1:  DIAZEPAM 5 MG TABS take 1-2 by mouth as needed   Last Refilled: 03/17/2009 Received faxed refill request, please advise. Form in your IN box   Method Requested: Telephone to Pharmacy Initial call taken by: Sherrian Divers CMA Deborra Medina),  April 29, 2009 3:57 PM  Follow-up for Phone Call        okay #60 x 0 Follow-up by: Claris Gower MD,  April 29, 2009 4:09 PM  Additional Follow-up for Phone Call Additional follow up Details #1::        Rx called to pharmacy Additional Follow-up by: Sherrian Divers CMA Deborra Medina),  April 29, 2009 4:30 PM    Prescriptions: DIAZEPAM 5 MG TABS (DIAZEPAM) take 1-2 by mouth as needed  #60 x 0   Entered by:   Sherrian Divers CMA ([place])   Authorized by:   Claris Gower MD   Signed by:   Sherrian Divers CMA ([place]) on 04/29/2009   Method used:   Telephoned to ...       [facility] [phone]* (retail)       [address] [place], [place]  [address]       Ph: [phone] or [phone]       Fax: [phone]   RxID:   [id number]

## 2010-04-14 NOTE — Progress Notes (Signed)
Summary: Rx Diazepam  Phone Note Refill Request Call back at [phone] Message from:  [facility] on June 08, 2009 4:42 PM  Refills Requested: Medication #1:  DIAZEPAM 5 MG TABS take 1-2 by mouth as needed Received faxed refill request, please advise.  Form in your IN box.   Method Requested: Fax to [place] Initial call taken by: Sherrian Divers CMA Deborra Medina),  June 08, 2009 4:42 PM  Follow-up for Phone Call        okay #180 x 0 Follow-up by: Claris Gower MD,  June 08, 2009 5:26 PM  Additional Follow-up for Phone Call Additional follow up Details #1::        Rx faxed to pharmacy/ [facility] Additional Follow-up by: Edwin Dada CMA Deborra Medina),  June 08, 2009 5:42 PM    Prescriptions: DIAZEPAM 5 MG TABS (DIAZEPAM) take 1-2 by mouth as needed  #180 x 0   Entered by:   Edwin Dada CMA ([place])   Authorized by:   Claris Gower MD   Signed by:   Edwin Dada CMA ([place]) on 06/08/2009   Method used:   Handwritten   RxID:   [id number]

## 2010-04-14 NOTE — Letter (Signed)
Summary: [facility]   Imported By: Edmonia James 05/26/2009 09:19:09  _____________________________________________________________________  External Attachment:    Type:   Image     Comment:   External Document  Appended Document: [place] no changes follow up in 2 months

## 2010-04-14 NOTE — Assessment & Plan Note (Signed)
Summary: 2 MONTH FOLLOW UP/[facility]   Vital Signs:  Patient profile:   70 year old female Weight:      173 pounds Temp:     98.9 degrees F oral Pulse rate:   60 / minute Pulse rhythm:   regular BP sitting:   140 / 62  (left arm) Cuff size:   regular  Vitals Entered By: Edwin Dada CMA Deborra Medina) (January 29, 2010 10:55 AM) CC: 2 month follow-up   History of Present Illness: DOing okay Having some balance problems at times [place] on cammode after sitting down on it wrong-- about 2 weeks still a little sore Fell backwards trying to put on socks without holding on to anything  Trimmed own nails and got some bleeding or right great toe some pain ongoing neuropathy  Diabetes control okay checks daily usually 120 or below no hypoglycemic reactions  No chest pain  No SOB no edema  Mood has been fine stress when daughter and family wanted to move in with her --then she realized it wasn't a good idea and bad time with them Already takes care of the 2 kids, bringing them to school and picking them up  Allergies: 1)  ! Gnp Aspirin (Aspirin) 2)  ! Lisinopril (Lisinopril) 3)  ! Chlorthalidone (Chlorthalidone) 4)  ! Bisoprolol Fumarate (Bisoprolol Fumarate)  Past History:  Past medical, surgical, family and social histories (including risk factors) reviewed for relevance to current acute and chronic problems.  Past Medical History: Reviewed history from 08/20/2008 and no changes required. Depression Diabetes mellitus, type II with neuropathy Hyperlipidemia Hypertension Osteoporosis Uterine cancer--P32 insert Chronic pain syndrome  Past Surgical History: Reviewed history from 01/01/2009 and no changes required. Appendectomy  1946  Adhesions shortly after Hysterectomy/BSO 1985  cancer kidney stone/ cysto-- [facility] 2003 bladder tacking- chronic pain since  1998 back surgery 1970s   SBO/ obstipation  10/2005 DEXA  stable from 2002  12/2005 Cholecystectomy, lysis of  adhesions--post op complications, trach, etc  8/10  Family History: Reviewed history from 08/29/2006 and no changes required. Dad died @86  prostate and colon cancer Mom died @86  CVA 1 sister CAD in Dad (CABG @77 ) No HTN or DM  Social History: Reviewed history from 08/20/2008 and no changes required. 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   Neck:  supple, no masses, no thyromegaly, no carotid bruits, and no cervical lymphadenopathy.   Lungs:  normal respiratory effort, no intercostal retractions, no accessory muscle use, and normal breath sounds.   Heart:  normal rate, regular rhythm, no murmur, and no gallop.   Abdomen:  soft and non-tender.   Extremities:  no edema small cut by toenail on right great toe Psych:  normally interactive, good eye contact, not anxious appearing, and not depressed appearing.    Diabetes Management Exam:    Foot Exam (with socks and/or shoes not present):       Sensory-Pinprick/Light touch:          Left medial foot (L-4): diminished          Left dorsal foot (L-5): diminished  Left lateral foot (S-1): diminished          Right medial foot (L-4): diminished          Right dorsal foot (L-5): diminished          Right lateral foot (S-1): diminished       Inspection:          Left foot: normal          Right foot: normal   Impression & Recommendations:  Problem # 1:  DIABETES MELLITUS, TYPE II, WITH NEUROLOGICAL COMPLICATIONS (DZH-299.24) Assessment Unchanged  seems to have good control will recheck  Her updated medication list for this problem  includes:    Metformin Hcl 500 Mg Tabs (Metformin hcl) .Marland Kitchen... Take 1 by mouth two times a day    Adult Aspirin Low Strength 81 Mg Tbdp (Aspirin) .Marland Kitchen... Take 1 tablet by mouth once a day  Labs Reviewed: Creat: 0.9 (11/12/2009)    Reviewed HgBA1c results: 6.3 (11/12/2009)  5.9 (05/11/2009)  Orders: TLB-Hemoglobin (Hgb) (85018-HGB)  Problem # 2:  DEPRESSION (ICD-311) Assessment: Unchanged mood stable on his meds   Her updated medication list for this problem includes:    Prozac 40 Mg Caps (Fluoxetine hcl) .Marland Kitchen... Take 1 capsule by mouth once a day    Cymbalta 60 Mg Cpep (Duloxetine hcl) .Marland Kitchen... Take 1 by mouth once daily    Diazepam 5 Mg Tabs (Diazepam) .Marland Kitchen... Take 1-2 by mouth as needed  Problem # 3:  HYPERTENSION (ICD-401.9) Assessment: Unchanged good control no changes needed  Her updated medication list for this problem includes:    Amlodipine Besylate 10 Mg Tabs (Amlodipine besylate) .Marland Kitchen... Take 1 by mouth once daily    Propranolol Hcl 40 Mg Tabs (Propranolol hcl) .Marland Kitchen... Take 1/2 tab two times a day  BP today: 140/62 Prior BP: 158/80 (11/12/2009)  Labs Reviewed: K+: 4.8 (11/12/2009) Creat: : 0.9 (11/12/2009)   Chol: 146 (05/11/2009)   HDL: 45.50 (05/11/2009)   LDL: DEL (11/05/2007)   TG: 208.0 (05/11/2009)  Problem # 4:  HYPERLIPIDEMIA (ICD-272.4) Assessment: Unchanged good control  Her updated medication list for this problem includes:    Simvastatin 20 Mg Tabs (Simvastatin) .Marland Kitchen... 1 daily for high blood pressure    Cholestyramine 4 Gm Pack (Cholestyramine) .Marland Kitchen... 1/2 - 1 pack mixed with water daily to reduce diarrhea  Labs Reviewed: SGOT: 27 (05/11/2009)   SGPT: 27 (05/11/2009)   HDL:45.50 (05/11/2009), 33.1 (11/05/2007)  LDL:DEL (11/05/2007), DEL (05/03/2007)  Chol:146 (05/11/2009), 227 (11/05/2007)  Trig:208.0 (05/11/2009), 724 (11/05/2007)  Problem # 5:  PAIN, CHRONIC NEC (ICD-338.29) Assessment: Comment Only needs the valium for spasm  Complete Medication List: 1)   Prozac 40 Mg Caps (Fluoxetine hcl) .... Take 1 capsule by mouth once a day 2)  Metformin Hcl 500 Mg Tabs (Metformin hcl) .... Take 1 by mouth two times a day 3)  Cymbalta 60 Mg Cpep (Duloxetine hcl) .... Take 1 by mouth once daily 4)  Amlodipine Besylate 10 Mg Tabs (Amlodipine besylate) .... Take 1 by mouth once daily 5)  Diazepam 5 Mg Tabs (Diazepam) .... Take 1-2 by mouth as needed 6)  Propranolol Hcl 40 Mg Tabs (Propranolol hcl) .... Take 1/2 tab two times a day 7)  Ultracet 37.5-325 Mg Tabs (Tramadol-acetaminophen) .... Take 1 by mouth two times a day as needed 8)  Simvastatin 20 Mg Tabs (Simvastatin) .Marland Kitchen.. 1 daily for high blood pressure 9)  Onetouch Ultra Test Strp (Glucose blood) .... Test daily or as directed 10)  Adult Aspirin Low Strength 81 Mg Tbdp (Aspirin) .... Take 1 tablet by mouth once a day 11)  Ogen 0.625 0.75 Mg Tabs (Estropipate) .... Take 1 tablet by mouth once daily 12)  Centrum Silver Tabs (Multiple vitamins-minerals) .... Once daily 13)  Calcium 600 Iron/d 600-18-125 Mg-mg-unit Tabs (Calcium-vitamin d-iron) .... Once daily 14)  Cholestyramine 4 Gm Pack (Cholestyramine) .... 1/2 - 1 pack mixed with water daily to reduce diarrhea  Patient Instructions: 1)  Please schedule a follow-up appointment in 6 months .  Prescriptions: DIAZEPAM 5 MG TABS (DIAZEPAM) take 1-2 by mouth as needed  #180 x 1   Entered by:   Edwin Dada CMA ([place])   Authorized by:   Claris Gower MD   Signed by:   Edwin Dada CMA ([place]) on 01/29/2010   Method used:   Print then Give to Patient   RxID:   [id number]    Orders Added: 1)  Est. Patient Level IV [35456] 2)  TLB-Hemoglobin (Hgb) [85018-HGB]    Current Allergies (reviewed today): ! GNP ASPIRIN (ASPIRIN) ! LISINOPRIL (LISINOPRIL) ! CHLORTHALIDONE (CHLORTHALIDONE) ! BISOPROLOL FUMARATE (BISOPROLOL FUMARATE)  Appended Document: 2 MONTH FOLLOW UP/[facility]     Clinical Lists Changes  Orders: Added new Test order of  TLB-A1C / Hgb A1C (Glycohemoglobin) (83036-A1C) - Signed

## 2010-04-14 NOTE — Letter (Signed)
Summary: [facility]   Imported By: Edmonia James 04/09/2009 13:17:30  _____________________________________________________________________  External Attachment:    Type:   Image     Comment:   External Document

## 2010-04-14 NOTE — Letter (Signed)
Summary: [facility]   Imported By: Edmonia James 09/29/2009 10:33:19  _____________________________________________________________________  External Attachment:    Type:   Image     Comment:   External Document  Appended Document: [place] continuing current meds  Gave Rx for her to continue lidoderm

## 2010-04-14 NOTE — Progress Notes (Signed)
Summary: form regarding diazepam  Phone Note From Pharmacy   Caller: [facility] Summary of Call: Form from [facility] regarding diazepam is on your desk.  They are asking if pt should be taking this at her age. Initial call taken by: Marty Heck CMA,  August 31, 2009 10:54 AM  Follow-up for Phone Call        form done needs the diazepam Follow-up by: Claris Gower MD,  August 31, 2009 12:39 PM  Additional Follow-up for Phone Call Additional follow up Details #1::        form faxed to [facility] and scanned. Additional Follow-up by: Edwin Dada CMA Deborra Medina),  August 31, 2009 3:13 PM

## 2010-04-14 NOTE — Progress Notes (Signed)
Summary: refill request for diazepam  Phone Note Refill Request Call back at Home Phone [phone] Message from:  Patient  Refills Requested: Medication #1:  DIAZEPAM 5 MG TABS take 1-2 by mouth as needed Phoned request from pt, please send to [facility].  She is asking for a 90 day supply with one refill.  Initial call taken by: Marty Heck CMA,  August 25, 2009 9:33 AM  Follow-up for Phone Call        Rx written Follow-up by: Claris Gower MD,  August 25, 2009 1:52 PM  Additional Follow-up for Phone Call Additional follow up Details #1::        Spoke with patient and advised rx ready for pick-up  Additional Follow-up by: Edwin Dada CMA Deborra Medina),  August 25, 2009 3:39 PM    New/Updated Medications: DIAZEPAM 5 MG TABS (DIAZEPAM) take 1-2 by mouth as needed Prescriptions: DIAZEPAM 5 MG TABS (DIAZEPAM) take 1-2 by mouth as needed  #180 x 1   Entered and Authorized by:   Claris Gower MD   Signed by:   Claris Gower MD on 08/25/2009   Method used:   Print then Give to Patient   RxID:   [id number]

## 2010-04-14 NOTE — Assessment & Plan Note (Signed)
Summary: [facility] UP / LFW   Vital Signs:  Patient profile:   70 year old female Weight:      167 pounds Temp:     98.4 degrees F oral Pulse rate:   68 / minute Pulse rhythm:   regular BP sitting:   158 / 80  (left arm) Cuff size:   regular  Vitals Entered By: Edwin Dada CMA Deborra Medina) (November 12, 2009 2:53 PM) CC: 6 MONTH FOLLOW-UP   History of Present Illness: Having trouble with diarrhea Wonders about celiac disease Gets abd burning after eating and gets cramps---then gets several loose stools as well as stomach ache Tried going off aspirin--didn't help tried off honey toast wtihout effect Even had fecal incontinence once in bed Did stop the immodium --seemed better after stopping at first taking lots of vitamins--asked her to stop  Checks sugars two times a day at times went back to metformin two times a day briefly---went back to daily Most under 130 No hypoglycemic reactions cymbalta helping the foot pain overdue for eye exam  Mood has been off due to diarrhea not anxious  No chest pain  No SOB  Allergies: 1)  ! Gnp Aspirin (Aspirin) 2)  ! Lisinopril (Lisinopril) 3)  ! Chlorthalidone (Chlorthalidone) 4)  ! Bisoprolol Fumarate (Bisoprolol Fumarate)  Past History:  Past medical, surgical, family and social histories (including risk factors) reviewed for relevance to current acute and chronic problems.  Past Medical History: Reviewed history from 08/20/2008 and no changes required. Depression Diabetes mellitus, type II with neuropathy Hyperlipidemia Hypertension Osteoporosis Uterine cancer--P32 insert Chronic pain syndrome  Past Surgical History: Reviewed history from 01/01/2009 and no changes required. Appendectomy  1946  Adhesions shortly after Hysterectomy/BSO 1985  cancer kidney stone/ cysto-- Aline Brochure 2003 bladder tacking- chronic pain since  1998 back surgery 1970s   SBO/ obstipation  10/2005 DEXA  stable from 2002   12/2005 Cholecystectomy, lysis of adhesions--post op complications, trach, etc  8/10  Family History: Reviewed history from 08/29/2006 and no changes required. Dad died @86  prostate and colon cancer Mom died @86  CVA 1 sister CAD in Dad (CABG @77 ) No HTN or DM  Social History: Reviewed history from 08/20/2008 and no changes required. 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  NAD Neck:  supple, no masses, no thyromegaly, no carotid bruits, and no cervical lymphadenopathy.   Lungs:  normal respiratory effort, no intercostal retractions, no accessory muscle use, and normal breath sounds.   Heart:  normal rate, regular rhythm, no murmur, and no gallop.   Abdomen:  soft, non-tender, normal bowel sounds, and no masses.   Pulses:  faint in feet Extremities:  no edema Skin:  no suspicious lesions and no ulcerations.   Psych:  normally interactive, good eye contact, not anxious appearing, and not depressed appearing.    Diabetes Management Exam:    Foot Exam (with socks and/or shoes not present):       Sensory-Pinprick/Light touch:          Left medial foot (L-4): diminished  Left dorsal foot (L-5): diminished          Left lateral foot (S-1): diminished          Right medial foot (L-4): diminished          Right dorsal foot (L-5): diminished          Right lateral foot (S-1): diminished       Inspection:          Left foot: normal          Right foot: normal       Nails:          Left foot: thickened          Right foot: thickened   Impression & Recommendations:  Problem # 1:  DIARRHEA  (ICD-787.91) Assessment New  doubt celiac but will check tissue transglutaminase ab stop all the vitamins and niacin will try cholestyramine  Orders: TLB-Renal Function Panel (80069-RENAL) Venipuncture (09381) T- * Misc. Laboratory test [phone]) Specimen Handling (99000)  Problem # 2:  DIABETES MELLITUS, TYPE II, WITH NEUROLOGICAL COMPLICATIONS (ZJI-967.89) Assessment: Unchanged  will recheck labs  Her updated medication list for this problem includes:    Metformin Hcl 500 Mg Tabs (Metformin hcl) .Marland Kitchen... Take 1 by mouth once daily    Adult Aspirin Low Strength 81 Mg Tbdp (Aspirin) .Marland Kitchen... Take 1 tablet by mouth once a day  Labs Reviewed: Creat: 0.9 (05/11/2009)    Reviewed HgBA1c results: 5.9 (05/11/2009)  7.3 (05/13/2008)  Orders: TLB-A1C / Hgb A1C (Glycohemoglobin) (83036-A1C)  Problem # 3:  HYPERTENSION (ICD-401.9) Assessment: Unchanged reasonable control  Her updated medication list for this problem includes:    Amlodipine Besylate 10 Mg Tabs (Amlodipine besylate) .Marland Kitchen... Take 1 by mouth once daily    Propranolol Hcl 40 Mg Tabs (Propranolol hcl) .Marland Kitchen... Take 1/2 tab two times a day  BP today: 158/80 Prior BP: 126/60 (05/11/2009)  Labs Reviewed: K+: 5.2 (05/11/2009) Creat: : 0.9 (05/11/2009)   Chol: 146 (05/11/2009)   HDL: 45.50 (05/11/2009)   LDL: DEL (11/05/2007)   TG: 208.0 (05/11/2009)  Problem # 4:  DEPRESSION (ICD-311) Assessment: Comment Only down a bit due to the diarrhea  no increase appropriate  Her updated medication list for this problem includes:    Prozac 40 Mg Caps (Fluoxetine hcl) .Marland Kitchen... Take 1 capsule by mouth once a day    Cymbalta 60 Mg Cpep (Duloxetine hcl) .Marland Kitchen... Take 1 by mouth once daily    Diazepam 5 Mg Tabs (Diazepam) .Marland Kitchen... Take 1-2 by mouth as needed  Complete Medication List: 1)  Prozac 40 Mg Caps (Fluoxetine hcl) .... Take 1 capsule by mouth once a day 2)  Metformin Hcl 500 Mg Tabs (Metformin hcl) .... Take 1 by mouth once daily 3)   Cymbalta 60 Mg Cpep (Duloxetine hcl) .... Take 1 by mouth once daily 4)  Amlodipine Besylate 10 Mg Tabs (Amlodipine besylate) .... Take 1 by mouth once daily 5)  Diazepam 5 Mg Tabs (Diazepam) .... Take 1-2 by mouth as needed 6)  Propranolol Hcl 40 Mg Tabs (Propranolol hcl) .... Take 1/2 tab two times a day 7)  Ultracet 37.5-325 Mg Tabs (Tramadol-acetaminophen) .... Take 1 by mouth two times a day as needed 8)  Simvastatin 20 Mg Tabs (Simvastatin) .Marland Kitchen.. 1 daily for high blood pressure 9)  Onetouch Ultra Test Strp (Glucose blood) .... Test daily or as directed 10)  Adult Aspirin Low Strength 81 Mg Tbdp (Aspirin) .... Take 1 tablet by mouth once a day  11)  Ogen 0.625 0.75 Mg Tabs (Estropipate) .... Take 1 tablet by mouth once daily 12)  Centrum Silver Tabs (Multiple vitamins-minerals) .... Once daily 13)  Calcium 600 Iron/d 600-18-125 Mg-mg-unit Tabs (Calcium-vitamin d-iron) .... Once daily 14)  Cholestyramine 4 Gm Pack (Cholestyramine) .... 1/2 - 1 pack mixed with water daily to reduce diarrhea  Patient Instructions: 1)  Please schedule a follow-up appointment in 2 months.  Prescriptions: CHOLESTYRAMINE 4 GM PACK (CHOLESTYRAMINE) 1/2 - 1 pack mixed with water daily to reduce diarrhea  #30 x 5   Entered and Authorized by:   Claris Gower MD   Signed by:   Claris Gower MD on 11/12/2009   Method used:   Electronically to        [facility] [phone]* (retail)       [place], [place]  [address]       Ph: [phone] or [phone]       Fax: [phone]   RxID:   [id number]   Current Allergies (reviewed today): ! [place] ([place]) ! LISINOPRIL (LISINOPRIL) ! CHLORTHALIDONE (CHLORTHALIDONE) ! BISOPROLOL FUMARATE (BISOPROLOL FUMARATE)  Appended Document: 6MTH FOLLOW UP / LFW     Clinical Lists Changes  Orders: Added new Service order of Flu Vaccine 49yr + [phone] - Signed Added new Service order of Admin 1st Vaccine [phone]  - Signed Added new Service order of Admin 1st Vaccine ([facility] [phone] - Signed Observations: Added new observation of FLU VAX#1VIS: 10/06/2009 (11/12/2009 15:37) Added new observation of FLU VAXLOT: [id number] (11/12/2009 15:37) Added new observation of FLU VAX EXP: 09/11/2010 (11/12/2009 15:37) Added new observation of FLU VAXBY: DeShannon Smith CMA ([place] (11/12/2009 15:37) Added new observation of FLU VAXRTE: IM (11/12/2009 15:37) Added new observation of FLU VAX DSE: 0.5 ml (11/12/2009 15:37) Added new observation of FLU VAXMFR: GlaxoSmithKline (11/12/2009 15:37) Added new observation of FLU VAX SITE: left deltoid (11/12/2009 15:37) Added new observation of FLU VAX: Fluvax 3+ (11/12/2009 15:37)       Influenza Vaccine    Vaccine Type: Fluvax 3+    Site: left deltoid    Mfr: GlaxoSmithKline    Dose: 0.5 ml    Route: IM    Given by: DEdwin DadaCMA ([place]    Exp. Date: 09/11/2010    Lot #: [id number]   VIS given: 10/06/2009  Flu Vaccine Consent Questions    Do you have a history of severe allergic reactions to this vaccine? no    Any prior history of allergic reactions to egg and/or gelatin? no    Do you have a sensitivity to the preservative Thimersol? no    Do you have a past history of Guillan-Barre Syndrome? no    Do you currently have an acute febrile illness? no    Have you ever had a severe reaction to latex? no    Vaccine information given and explained to patient? yes    Are you currently pregnant? no

## 2010-04-14 NOTE — Progress Notes (Signed)
Summary: prozac and simvastatin   Phone Note From Pharmacy Call back at [phone]   Caller: [place] Call For: Dr. Silvio Pate   Summary of Call: Pharmacy called to verify if patient should be taking the Prozac. She said she has already been prescribed cymbalta by Dr. Lucilla Edin and she just wants the okay to go ahead a fill this. She also had some concern regarding the Simvastatin, she said that patient has had some acute kidney failure and that is a precaution with the simvastatin. She can be reached at [phone] option 2 reference # [id number].  Initial call taken by: Lacretia Nicks,  June 10, 2009 2:52 PM  Follow-up for Phone Call        yes, she should be on both cymbalta and prozac  Last creatinine 0.9 should be on the statin Follow-up by: Claris Gower MD,  June 10, 2009 6:31 PM  Additional Follow-up for Phone Call Additional follow up Details #1::        I was on the phone too long and hung up, will try and call again later. DeShannon Smith CMA ([place])  June 12, 2009 1:11 PM      Appended Document: prozac and simvastatin  Advised pharmacist at [facility].

## 2010-04-14 NOTE — Assessment & Plan Note (Signed)
Summary: 3 M F/U DLO   Vital Signs:  Patient profile:   70 year old female Weight:      149 pounds BMI:     23.42 Temp:     98.7 degrees F oral Pulse rate:   60 / minute Pulse rhythm:   regular BP sitting:   126 / 60  (left arm) Cuff size:   regular  Vitals Entered By: Edwin Dada CMA Deborra Medina) (May 11, 2009 11:51 AM) CC: 3 MONTH FOLLOW-UP   History of Present Illness: having a problem with her back Dr Phineas Douglas switched her to tramadol two times a day  Not helping all that much Now with pain right at sacrum and down both legs Feels tired but no focal weakness Painful to walk around Having trouble with bowels---they are still loose but not diarrhea (loose stool went away when she stopped the immodium). Does note some leakage later--after she moves her bowels  Does eat wheat bread and fresh fruits and veggies for roughage discussed fiber supplement  Lots of hair fell out diffuse over the entire head discussed probable telogen effluvium   blood sugar 103 today No hypoglycemic reactions down to once a day metformin Rx given for test strips due for eye exam  corrected statin dose no problems with this  Allergies: 1)  ! Gnp Aspirin (Aspirin) 2)  ! Lisinopril (Lisinopril) 3)  ! Chlorthalidone (Chlorthalidone) 4)  ! Bisoprolol Fumarate (Bisoprolol Fumarate)  Past History:  Past medical, surgical, family and social histories (including risk factors) reviewed for relevance to current acute and chronic problems.  Past Medical History: Reviewed history from 08/20/2008 and no changes required. Depression Diabetes mellitus, type II with neuropathy Hyperlipidemia Hypertension Osteoporosis Uterine cancer--P32 insert Chronic pain syndrome  Past Surgical History: Reviewed history from 01/01/2009 and no changes required. Appendectomy  1946  Adhesions shortly after Hysterectomy/BSO 1985  cancer kidney stone/ cysto-- Aline Brochure 2003 bladder tacking- chronic pain since   1998 back surgery 1970s   SBO/ obstipation  10/2005 DEXA  stable from 2002  12/2005 Cholecystectomy, lysis of adhesions--post op complications, trach, etc  8/10  Family History: Reviewed history from 08/29/2006 and no changes required. Dad died @86  prostate and colon cancer Mom died @86  CVA 1 sister CAD in Dad (CABG @77 ) No HTN or DM  Social History: Reviewed history from 08/20/2008 and no changes required. 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   Neck:  supple, no masses, no thyromegaly, no carotid bruits, and no cervical lymphadenopathy.   Lungs:  normal respiratory effort and normal breath sounds.   Heart:  normal rate, regular rhythm, no murmur, and no gallop.   Pulses:  1+ in feet Extremities:  no edema Skin:  no suspicious lesions and no ulcerations.   Psych:  normally interactive, good eye contact, not anxious appearing, and not depressed appearing.    Diabetes Management Exam:    Foot Exam (with socks and/or shoes not present):       Sensory-Pinprick/Light touch:          Left medial foot (L-4): normal          Left dorsal foot (L-5): normal  Left lateral foot (S-1): normal          Right medial foot (L-4): normal          Right dorsal foot (L-5): normal          Right lateral foot (S-1): normal       Inspection:          Left foot: normal          Right foot: normal       Nails:          Left foot: normal          Right foot: normal   Impression & Recommendations:  Problem # 1:  DIABETES MELLITUS, TYPE II, WITH NEUROLOGICAL COMPLICATIONS (VOJ-500.93) Assessment Unchanged  seems to have good control  cymbalta controls the neuropathic pain fairly well  Her  updated medication list for this problem includes:    Adult Aspirin Low Strength 81 Mg Tbdp (Aspirin) .Marland Kitchen... Take 1 tablet by mouth once a day    Metformin Hcl 500 Mg Tabs (Metformin hcl) .Marland Kitchen... Take 1 by mouth once daily  Labs Reviewed: Creat: 0.7 (11/05/2007)    Reviewed HgBA1c results: 7.3 (05/13/2008)  9.7 (03/12/2008)  Orders: TLB-A1C / Hgb A1C (Glycohemoglobin) (83036-A1C)  Problem # 2:  HYPERTENSION (ICD-401.9) Assessment: Unchanged  good control no changes needed  Her updated medication list for this problem includes:    Amlodipine Besylate 10 Mg Tabs (Amlodipine besylate) .Marland Kitchen... Take 1 by mouth once daily    Propranolol Hcl 40 Mg Tabs (Propranolol hcl) .Marland Kitchen... Take 1/2 tab two times a day  BP today: 126/60 Prior BP: 160/80 (02/12/2009)  Labs Reviewed: K+: 4.9 (11/05/2007) Creat: : 0.7 (11/05/2007)   Chol: 227 (11/05/2007)   HDL: 33.1 (11/05/2007)   LDL: DEL (11/05/2007)   TG: 724 (11/05/2007)  Orders: TLB-Renal Function Panel (80069-RENAL) TLB-CBC Platelet - w/Differential (85025-CBCD) TLB-TSH (Thyroid Stimulating Hormone) (84443-TSH) Venipuncture (81829)  Problem # 3:  HYPERLIPIDEMIA (ICD-272.4) Assessment: Unchanged  will recheck labs  The following medications were removed from the medication list:    Simvastatin 40 Mg Tabs (Simvastatin) .Marland Kitchen... 1 tab daily for high blood pressure Her updated medication list for this problem includes:    Simvastatin 20 Mg Tabs (Simvastatin) .Marland Kitchen... 1 daily for high blood pressure  Labs Reviewed: SGOT: 29 (11/05/2007)   SGPT: 32 (11/05/2007)   HDL:33.1 (11/05/2007), 30.4 (05/03/2007)  LDL:DEL (11/05/2007), DEL (05/03/2007)  Chol:227 (11/05/2007), 193 (05/03/2007)  Trig:724 (11/05/2007), 533 (05/03/2007)  Orders: TLB-Lipid Panel (80061-LIPID) TLB-Hepatic/Liver Function Pnl (80076-HEPATIC)  Problem # 4:  PAIN, CHRONIC NEC (ICD-338.29) Assessment: Unchanged Dr Phineas Douglas manages  Complete Medication List: 1)  Prozac 40 Mg Caps  (Fluoxetine hcl) .... Take 1 capsule by mouth once a day 2)  Adult Aspirin Low Strength 81 Mg Tbdp (Aspirin) .... Take 1 tablet by mouth once a day 3)  Metformin Hcl 500 Mg Tabs (Metformin hcl) .... Take 1 by mouth once daily 4)  Ogen 0.625 0.75 Mg Tabs (Estropipate) .... Take 1 tablet by mouth once daily 5)  Cymbalta 60 Mg Cpep (Duloxetine hcl) .... Take 1 by mouth once daily 6)  Amlodipine Besylate 10 Mg Tabs (Amlodipine besylate) .... Take 1 by mouth once daily 7)  Diazepam 5 Mg Tabs (Diazepam) .... Take 1-2 by mouth as needed 8)  Propranolol Hcl 40 Mg Tabs (Propranolol hcl) .... Take 1/2 tab two times a day 9)  Ultracet 37.5-325 Mg Tabs (Tramadol-acetaminophen) .... Take 1 by mouth two times a day as needed  10)  Simvastatin 20 Mg Tabs (Simvastatin) .Marland Kitchen.. 1 daily for high blood pressure 11)  Onetouch Ultra Test Strp (Glucose blood) .... Test daily or as directed  Patient Instructions: 1)  Please schedule a follow-up appointment in 6 months .  Prescriptions: ONETOUCH ULTRA TEST  STRP (GLUCOSE BLOOD) Test daily or as directed  #100 x 3   Entered and Authorized by:   Claris Gower MD   Signed by:   Claris Gower MD on 05/11/2009   Method used:   Electronically to        [facility]. [phone]* (retail)       [place], [place]  [address]       Ph: [phone] or [phone]       Fax: [phone]   RxID:   [id number]   Current Allergies (reviewed today): ! [place] ([place]) ! LISINOPRIL (LISINOPRIL) ! CHLORTHALIDONE (CHLORTHALIDONE) ! BISOPROLOL FUMARATE (BISOPROLOL FUMARATE)

## 2010-04-14 NOTE — Progress Notes (Signed)
Summary: DIAZEPAM   Phone Note [facility]ill Request Message [facility]  [phone] on March 17, 2009 11:41 AM  Refills Requested: Medication #1:  DIAZEPAM 5 MG TABS take 1-2 by mouth as needed   Last Refilled: 02/08/2009  Method Requested: Telephone to Pharmacy Initial call taken by: Edwin Dada CMA Deborra Medina),  March 17, 2009 11:41 AM  Follow-up for Phone Call        On longeterm.Marland Kitchenlast OV in 02/2009. Follow-up by: Eliezer Lofts MD,  March 17, 2009 11:58 AM    Prescriptions: DIAZEPAM 5 MG TABS (DIAZEPAM) take 1-2 by mouth as needed  #60 x 0   Entered by:   Zenda Alpers CMA ([place])   Authorized by:   Eliezer Lofts MD   Signed by:   Zenda Alpers CMA ([place]) on 03/17/2009   Method used:   [facility]ph[facility] ...       [facility] [phone]* (retail)       [place], [place]  [address]       Ph: [phone] or [phone]       Fax: [phone]   RxID:   [id number] DIAZEPAM 5 MG TABS (DIAZEPAM) take 1-2 by mouth as needed  #0 x 0   Entered and Authorized by:   Eliezer Lofts MD   Signed by:   Eliezer Lofts MD on 03/17/2009   Method used:   Telephoned to ...       [facility] [phone]* (retail)       [address] [place],   [address]       Ph: [phone] or [phone]       Fax: [phone]   RxID:   [id number]

## 2010-04-14 NOTE — Medication Information (Signed)
Summary: Clarification for Diazepam/[facility]  Clarification for Diazepam/[facility]   Imported By: Edmonia James 09/04/2009 11:21:24  _____________________________________________________________________  External Attachment:    Type:   Image     Comment:   External Document

## 2010-04-14 NOTE — Letter (Signed)
Summary: [place]   Imported By: Edmonia James 01/27/2010 13:40:20  _____________________________________________________________________  External Attachment:    Type:   Image     Comment:   External Document  Appended Document: [place] no changes 2 month follow up

## 2010-04-14 NOTE — Progress Notes (Signed)
Summary: Rx Metformin  Phone Note Call from Patient Call back at Home Phone [phone]   Caller: Patient Call For: Jaclyn Gower MD Summary of Call: Patient  has been taking Metformin 500, once daily since getting out of the hospital last year. Patient states that she is now taking  it two times a day because her sugar has been going up since she has gained her weight back. Patient states that she needs a new rx sent to [facility] with the directions of two times a day because of the way that it was written she is coming up short. Patient would like this sent to [facility]. Patient states  that she only has 3 pills left and will need [facility] supply [facility] to last her until she gets the mail order.   Initial call taken by: Emelia Salisbury LPN,  January 13, 4080 10:26 AM  Follow-up for Phone Call        okay to change to 537m two times a day Send 1 year to [facility] and #60 x 0 locally Follow-up by: RClaris GowerMD,  January 12, 2010 1:20 PM  Additional Follow-up for Phone Call Additional follow up Details #1::        Rx [facility] and advised results.  Additional Follow-up by: DEdwin DadaCMA (Deborra Medina,  January 12, 2010 3:25 PM    New/Updated Medications: METFORMIN HCL 500 MG TABS (METFORMIN HCL) take 1 by mouth two times a day Prescriptions: METFORMIN HCL 500 MG TABS (METFORMIN HCL) take 1 by mouth two times a day  #180 x 3   Entered by:   DEdwin DadaCMA ([place]   Authorized by:   RClaris GowerMD   Signed by:   DEdwin DadaCMA (AAss[place] on 01/12/2010   Method used:   Electronically to        [facility](retail)             ,          Ph: [phone]      Fax: [phone]  RxID::   [id number] HCL 500 MG TABS (METFORMIN HCL) take 1 by mouth two times a day  #60 x 0   Entered by:   DEdwin DadaCMA (AOle[place]   Authorized by:   RClaris GowerMD   Signed by:   DEdwin DadaCMA (AH[place]  on 01/12/2010   Method used:   Electronically to        [facility][phone] (retail)       [address]  [address]      Ph: [phone] [phone]      Fax: [phone]  RxID:   [id number]

## 2010-04-14 NOTE — Letter (Signed)
Summary: [place]   Imported By: Edmonia James 11/30/2009 12:52:18  _____________________________________________________________________  External Attachment:    Type:   Image     Comment:   External Document

## 2010-04-15 NOTE — Progress Notes (Signed)
----   Converted from flag ---- ---- 04/08/2010 5:28 PM, Claris Gower MD wrote: discussed that she has been made a bed offer at [facility] Will see her there  ---- 04/08/2010 3:55 PM, Lacretia Nicks wrote: Patient's son is asking if you could give him a call at your earliest convenience. He has some questions regarding Rehab w/ [facility]. [phone]. ------------------------------

## 2010-04-15 NOTE — Progress Notes (Signed)
Summary: pt is in hospital  Phone Note Call from Patient   Caller: Jaclyn Peterson [phone] Summary of Call: Pt had appt with you today but she was admitted to [facility] last night with mild pneumonia and a UTI.               Marianne, AAMA  April 07, 2010 9:26 AM   Follow-up for Phone Call        In 212  Had been having some falls Found to have pneumonia Improving but told that she will need rehab before going home Notified admissions coordinator at [facility] Follow-up by: Claris Gower MD,  April 08, 2010 1:25 PM  Additional Follow-up for Phone Call Additional follow up Details #1::        told her that she has bed offer at [facility] will see her there next week Additional Follow-up by: Claris Gower MD,  April 08, 2010 5:30 PM

## 2010-04-15 NOTE — Letter (Signed)
Summary: [place]   Imported By: Edmonia James 04/01/2010 08:27:35  _____________________________________________________________________  External Attachment:    Type:   Image     Comment:   External Document  Appended Document: [place] new radiculopathy trying epidural and S-I joint injections soon

## 2010-04-22 DIAGNOSIS — M549 Dorsalgia, unspecified: Secondary | ICD-10-CM

## 2010-04-22 DIAGNOSIS — J189 Pneumonia, unspecified organism: Secondary | ICD-10-CM

## 2010-04-22 DIAGNOSIS — F329 Major depressive disorder, single episode, unspecified: Secondary | ICD-10-CM

## 2010-04-22 DIAGNOSIS — I10 Essential (primary) hypertension: Secondary | ICD-10-CM

## 2010-04-23 ENCOUNTER — Encounter: Payer: Self-pay | Admitting: Internal Medicine

## 2010-04-29 NOTE — Letter (Signed)
Summary: [facility]   Imported By: Jamelle Haring 04/20/2010 09:07:45  _____________________________________________________________________  External Attachment:    Type:   Image     Comment:   External Document

## 2010-04-29 NOTE — Miscellaneous (Signed)
Summary: Medication update  Medications Added DIAZEPAM 2 MG TABS (DIAZEPAM) take 1 by mouth two times a day as needed PROPRANOLOL HCL 20 MG TABS (PROPRANOLOL HCL) take 1 by mouth two times a day OS-CAL 500 + D 500-600 MG-UNIT TABS (CALCIUM CARB-CHOLECALCIFEROL) once daily SENNA-S 8.6-50 MG TABS (SENNOSIDES-DOCUSATE SODIUM) take 1 by mouth two times a day for constipation TRAMADOL HCL 50 MG TABS (TRAMADOL HCL) take 1 by mouth two times a day as needed for pain MAX dose is 4per day METHOCARBAMOL 500 MG TABS (METHOCARBAMOL) take 1 by mouth three times a day as needed for spasms       Clinical Lists Changes  Medications: Removed medication of DIAZEPAM 5 MG TABS (DIAZEPAM) take 1-2 by mouth as needed Added new medication of DIAZEPAM 2 MG TABS (DIAZEPAM) take 1 by mouth two times a day as needed Removed medication of PROPRANOLOL HCL 40 MG TABS (PROPRANOLOL HCL) take 1/2 tab two times a day Added new medication of PROPRANOLOL HCL 20 MG TABS (PROPRANOLOL HCL) take 1 by mouth two times a day Removed medication of CALCIUM 600 IRON/D 600-18-125 MG-MG-UNIT TABS (CALCIUM-VITAMIN D-IRON) once daily Removed medication of CENTRUM SILVER  TABS (MULTIPLE VITAMINS-MINERALS) once daily Added new medication of OS-CAL 500 + D 500-600 MG-UNIT TABS (CALCIUM CARB-CHOLECALCIFEROL) once daily Added new medication of SENNA-S 8.6-50 MG TABS (SENNOSIDES-DOCUSATE SODIUM) take 1 by mouth two times a day for constipation Removed medication of ULTRACET 37.5-325 MG TABS (TRAMADOL-ACETAMINOPHEN) take 1 by mouth two times a day as needed Added new medication of TRAMADOL HCL 50 MG TABS (TRAMADOL HCL) take 1 by mouth two times a day as needed for pain MAX dose is 4per day Added new medication of METHOCARBAMOL 500 MG TABS (METHOCARBAMOL) take 1 by mouth three times a day as needed for spasms - Signed Rx of METHOCARBAMOL 500 MG TABS (METHOCARBAMOL) take 1 by mouth three times a day as needed for spasms;  #90 x 3;  Signed;  Entered  by: Edwin Dada CMA (AAMA);  Authorized by: Claris Gower MD;  Method used: Electronically to [facility]. [phone]*, [address], [place], [place], [place]  [address], Ph: [phone] or [phone], Fax: [phone]    Prescriptions: METHOCARBAMOL 500 MG TABS (METHOCARBAMOL) take 1 by mouth three times a day as needed for spasms  #90 x 3   Entered by:   Edwin Dada CMA ([place])   Authorized by:   Claris Gower MD   Signed by:   Edwin Dada CMA ([place]) on 04/23/2010   Method used:   Electronically to        [facility] [phone]* (retail)       [address] [place], [place]  [address]       Ph: [phone] or [phone]       Fax: [phone]   RxID:   [id number]

## 2010-05-05 ENCOUNTER — Ambulatory Visit (INDEPENDENT_AMBULATORY_CARE_PROVIDER_SITE_OTHER): Payer: Medicare Other | Admitting: Internal Medicine

## 2010-05-05 ENCOUNTER — Encounter: Payer: Self-pay | Admitting: Internal Medicine

## 2010-05-05 DIAGNOSIS — R42 Dizziness and giddiness: Secondary | ICD-10-CM

## 2010-05-11 NOTE — Assessment & Plan Note (Signed)
Summary: PER DR Kavir Savoca 2 WK F/U   Vital Signs:  Patient profile:   70 year old female Weight:      173 pounds BMI:     27.19 Temp:     98.4 degrees F oral Pulse rate:   58 / minute Pulse rhythm:   regular BP sitting:   133 / 61  (left arm) Cuff size:   regular  Vitals Entered By: Edwin Dada CMA Deborra Medina) (May 05, 2010 11:36 AM) CC: 2week follow-up   History of Present Illness: Has been home for 12 days Had been hospitalized after falls and pneumonia went to [facility] for rehab  Able to drive here now Has been alone since home Daughter, dogs, husband all moving in soon Has been cooking Not able to stand for extended time  still doing exercises herself--done with therapy  Balance is better now no "whooshing" in head anymore  Allergies: 1)  ! Gnp Aspirin (Aspirin) 2)  ! Lisinopril (Lisinopril) 3)  ! Chlorthalidone (Chlorthalidone) 4)  ! Bisoprolol Fumarate (Bisoprolol Fumarate)  Past History:  Past medical, surgical, family and social histories (including risk factors) reviewed for relevance to current acute and chronic problems.  Past Medical History: Reviewed history from 08/20/2008 and no changes required. Depression Diabetes mellitus, type II with neuropathy Hyperlipidemia Hypertension Osteoporosis Uterine cancer--P32 insert Chronic pain syndrome  Past Surgical History: Appendectomy  1946  Adhesions shortly after Hysterectomy/BSO 1985  cancer kidney stone/ cysto-- [place] 2003 bladder tacking- chronic pain since  1998 back surgery 1970s   SBO/ obstipation  10/2005 DEXA  stable from 2002  12/2005 Cholecystectomy, lysis of adhesions--post op complications, trach, etc  8/10 Falls/pneumonia 1/12  Family History: Reviewed history from 08/29/2006 and no changes required. Dad died @86  prostate and colon cancer Mom died @86  CVA 1 sister CAD in Dad (CABG @77 ) No HTN or DM  Social History: Reviewed history from 08/20/2008 and no changes  required. 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   Neck:  supple, no masses, no thyromegaly, and no cervical lymphadenopathy.   Lungs:  normal respiratory effort, no intercostal retractions, no accessory muscle use, and normal breath sounds.   Heart:  normal rate, regular rhythm, no murmur, and no gallop.   Extremities:  no edema Neurologic:  alert & oriented X3, strength normal in all extremities, and gait normal.   Psych:  normally interactive, good eye contact, not anxious appearing, and not depressed appearing.     Impression & Recommendations:  Problem # 1:  DIZZINESS (ICD-780.4) Assessment Improved  seems to have been vestibular better now No resp symptoms back to baseline  Did decrease diazepam and change flexeril to chlorzoxazone to reduce change of medicaiton effect  Complete Medication List: 1)  Tramadol Hcl 50 Mg Tabs (Tramadol hcl) .... Take 1 by mouth two times a day as needed for pain max dose is 4per day 2)  Methocarbamol 500 Mg Tabs (Methocarbamol) .... Take 1 by mouth three times a day as needed for spasms 3)  Cholestyramine 4 Gm Pack (Cholestyramine) .... 1/2 - 1 pack mixed with water daily to reduce diarrhea 4)  Propranolol Hcl 20 Mg Tabs (Propranolol hcl) .... Take 1 by mouth two times a day 5)  Diazepam 2 Mg Tabs (Diazepam) .... Take 1 by mouth two times a day as needed 6)  Prozac 40 Mg Caps (Fluoxetine hcl) .... Take 1 capsule by mouth once a day 7)  Metformin Hcl 500 Mg Tabs (Metformin hcl) .... Take 1 by mouth two times a day 8)  Cymbalta 60 Mg Cpep (Duloxetine hcl) .... Take 1 by mouth once daily 9)  Amlodipine Besylate 10 Mg Tabs (Amlodipine besylate) .... Take 1 by mouth once daily 10)   Simvastatin 20 Mg Tabs (Simvastatin) .Marland Kitchen.. 1 daily for high blood pressure 11)  Ogen 0.625 0.75 Mg Tabs (Estropipate) .... Take 1 tablet by mouth once daily 12)  Os-cal 500 + D 500-600 Mg-unit Tabs (Calcium carb-cholecalciferol) .... Once daily 13)  Senna-s 8.6-50 Mg Tabs (Sennosides-docusate sodium) .... Take 1 by mouth two times a day for constipation 14)  Onetouch Ultra Test Strp (Glucose blood) .... Test daily or as directed 15)  Adult Aspirin Low Strength 81 Mg Tbdp (Aspirin) .... Take 1 tablet by mouth once a day  Patient Instructions: 1)  Please keep the May 14th appt   Orders Added: 1)  Est. Patient Level III [18485]    Current Allergies (reviewed today): ! [place] (ASPIRIN) ! LISINOPRIL (LISINOPRIL) ! CHLORTHALIDONE (CHLORTHALIDONE) ! BISOPROLOL FUMARATE (BISOPROLOL FUMARATE)

## 2010-05-20 ENCOUNTER — Ambulatory Visit: Payer: Self-pay | Admitting: Anesthesiology

## 2010-05-20 ENCOUNTER — Encounter: Payer: Self-pay | Admitting: Internal Medicine

## 2010-06-01 NOTE — Letter (Signed)
Summary: Jaclyn Peterson By: Jamelle Haring 05/25/2010 08:46:22  _____________________________________________________________________  External Attachment:    Type:   Image     Comment:   External Document  Appended Document: [facility] Eval. renewed flexeril and cymbalta

## 2010-06-01 NOTE — Letter (Signed)
Summary: [facility]   [facility]   Imported By: Jamelle Haring 05/25/2010 08:34:29  _____________________________________________________________________  External Attachment:    Type:   Image     Comment:   External Document

## 2010-06-16 ENCOUNTER — Other Ambulatory Visit: Payer: Self-pay | Admitting: Internal Medicine

## 2010-07-08 ENCOUNTER — Ambulatory Visit: Payer: Self-pay | Admitting: Anesthesiology

## 2010-07-23 ENCOUNTER — Encounter: Payer: Self-pay | Admitting: Internal Medicine

## 2010-07-25 ENCOUNTER — Other Ambulatory Visit: Payer: Self-pay | Admitting: Internal Medicine

## 2010-07-26 ENCOUNTER — Other Ambulatory Visit: Payer: Self-pay | Admitting: Internal Medicine

## 2010-07-26 ENCOUNTER — Ambulatory Visit (INDEPENDENT_AMBULATORY_CARE_PROVIDER_SITE_OTHER): Payer: Medicare Other | Admitting: Internal Medicine

## 2010-07-26 ENCOUNTER — Encounter: Payer: Self-pay | Admitting: Internal Medicine

## 2010-07-26 VITALS — BP 140/60 | HR 56 | Temp 98.5°F | Ht 67.0 in | Wt 169.0 lb

## 2010-07-26 DIAGNOSIS — I10 Essential (primary) hypertension: Secondary | ICD-10-CM

## 2010-07-26 DIAGNOSIS — E1149 Type 2 diabetes mellitus with other diabetic neurological complication: Secondary | ICD-10-CM

## 2010-07-26 DIAGNOSIS — F329 Major depressive disorder, single episode, unspecified: Secondary | ICD-10-CM

## 2010-07-26 DIAGNOSIS — E785 Hyperlipidemia, unspecified: Secondary | ICD-10-CM

## 2010-07-26 DIAGNOSIS — Z13220 Encounter for screening for lipoid disorders: Secondary | ICD-10-CM

## 2010-07-26 DIAGNOSIS — G8929 Other chronic pain: Secondary | ICD-10-CM

## 2010-07-26 NOTE — Progress Notes (Signed)
Subjective:    Patient ID: Jaclyn Peterson Section, female    DOB: 12-30-1940, 70 y.o.   MRN: [id number]  HPI DOing okay Did have fall recently---slid off bed and fell onto end table Right arm stuck between bed and end table Arm is all healed up now  Daughter and family living with her Working out okay Daughter helps with the shopping and cooking, etc  Just got new treadmill Trying to exercise regularly Still has some weakness in legs Pain control is fair---tramadol does help  Checks sugars twice a day Discussed decreasing to no more than once a day Average in 120's No sig hypoglycemic reactions  No chest pain IN SOB No edema  Mood has been good No problems with depression  Current outpatient prescriptions:amLODipine (NORVASC) 10 MG tablet, Take 10 mg by mouth daily.  , Disp: , Rfl: ;  aspirin 81 MG tablet, Take 81 mg by mouth daily.  , Disp: , Rfl: ;  Calcium Carb-Cholecalciferol (OS-CAL 500 + D) 500-600 MG-UNIT TABS, Take by mouth daily.  , Disp: , Rfl: ;  cholestyramine (QUESTRAN) 4 G packet, Take 1 packet by mouth daily.  , Disp: , Rfl:  diazepam (VALIUM) 2 MG tablet, Take 2 mg by mouth 2 (two) times daily as needed.  , Disp: , Rfl: ;  DULoxetine (CYMBALTA) 60 MG capsule, Take 60 mg by mouth daily.  , Disp: , Rfl: ;  Estropipate (OGEN 0.625 PO), Take 1 tablet by mouth daily.  , Disp: , Rfl: ;  FLUoxetine (PROZAC) 40 MG capsule, TAKE 1 CAPSULE DAILY, Disp: 90 capsule, Rfl: 3;  metFORMIN (GLUCOPHAGE) 500 MG tablet, Take 500 mg by mouth 2 (two) times daily with a meal.  , Disp: , Rfl:  methocarbamol (ROBAXIN) 500 MG tablet, Take 500 mg by mouth 3 (three) times daily as needed.  , Disp: , Rfl: ;  ONE TOUCH ULTRA TEST test strip, TEST DAILY OR AS DIRECTED, Disp: 100 each, Rfl: 3;  propranolol (INDERAL) 20 MG tablet, Take 20 mg by mouth 2 (two) times daily.  , Disp: , Rfl: ;  senna-docusate (SENOKOT-S) 8.6-50 MG per tablet, Take 1 tablet by mouth 2 (two) times daily.  , Disp: , Rfl:    simvastatin (ZOCOR) 20 MG tablet, TAKE 1 TABLET DAILY FOR HIGH BLOOD PRESSURE, Disp: 90 tablet, Rfl: 3;  traMADol (ULTRAM) 50 MG tablet, Take 50 mg by mouth 2 (two) times daily as needed. MAX DOSE IS 4per DAY , Disp: , Rfl: ;  DISCONTD: FLUoxetine (PROZAC) 40 MG capsule, Take 40 mg by mouth daily.  , Disp: , Rfl:   Past Medical History  Diagnosis Date  . Depression   . Diabetes mellitus   . Hyperlipidemia   . Hypertension   . Osteoporosis   . 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 Chronic pain   . SBO (small bowel obstruction)     obstipation  10/2005  . Falls frequently     Falls/pneumonia 1/12    Past Surgical History  Procedure Date  . Appendectomy     1946  Adhesions shortly after  . Abdominal hysterectomy     BSO 1985  cancer  . Kidney stone surgery     cysto-- [place] 2003  . Bladder repair     bladder tacking- chronic pain since  1998  . Back surgery 1970's  . Cholecystectomy     lysis of adhesions--post op complications, trach, etc  8/10    Family History  Problem  Relation Age of Onset  . Coronary artery disease Father   . Hypertension Neg Hx   . Diabetes Neg Hx     History   Social History  . Marital Status: Married    Spouse Name: N/A    Number of Children: 2  . Years of Education: N/A   Occupational History  . 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 Smoking status: Current Everyday Smoker  . Smokeless tobacco: Not on file  . Alcohol Use: No  . Drug Use: No  . Sexually Active: Not on file   Other Topics Concern  . Not on file   Social History Narrative   Enjoys craftsRegular Exercise - no   Review of Systems Appetite is great Weight fairly stable Sleeping well     Objective:   Physical Exam  Constitutional: She appears well-developed and well-nourished. No distress.  Neck: Normal range of motion. Neck supple. No thyromegaly present.  Cardiovascular: Normal rate, regular rhythm and intact distal pulses.   Exam reveals no gallop.   No murmur heard. Pulmonary/Chest: Effort normal. No respiratory distress. She has no wheezes. She has no rales.  Abdominal: Soft. There is no tenderness.  Musculoskeletal: Normal range of motion. She exhibits no edema and no tenderness.  Lymphadenopathy:    She has no cervical adenopathy.  Skin:       No ulcers Slightly thickened toenails  Psychiatric: She has a normal mood and affect. Her behavior is normal. Judgment and thought content normal.          Assessment & Plan:

## 2010-07-27 LAB — BASIC METABOLIC PANEL
BUN: 17 mg/dL (ref 6–23)
Chloride: 99 mEq/L (ref 96–112)
GFR: 63.05 mL/min (ref >60.00)
Potassium: 4.1 mEq/L (ref 3.5–5.1)
Sodium: 139 mEq/L (ref 135–145)

## 2010-07-27 LAB — HEPATIC FUNCTION PANEL
AST: 20 U/L (ref 0–37)
Albumin: 4.1 g/dL (ref 3.5–5.2)
Alkaline Phosphatase: 96 U/L (ref 39–117)
Bilirubin, Direct: 0.1 mg/dL (ref 0.0–0.3)

## 2010-07-27 LAB — LDL CHOLESTEROL, DIRECT: Direct LDL: 62.5 mg/dL

## 2010-07-27 LAB — CBC WITH DIFFERENTIAL/PLATELET
Basophils Absolute: 0 10*3/uL (ref 0.0–0.1)
Eosinophils Absolute: 0.3 10*3/uL (ref 0.0–0.7)
HCT: 39.3 % (ref 36.0–46.0)
Hemoglobin: 13.6 g/dL (ref 12.0–15.0)
Lymphs Abs: 3.1 10*3/uL (ref 0.7–4.0)
MCHC: 34.7 g/dL (ref 30.0–36.0)
MCV: 92 fl (ref 78.0–100.0)
Monocytes Absolute: 0.5 10*3/uL (ref 0.1–1.0)
Monocytes Relative: 6.3 % (ref 3.0–12.0)
Neutro Abs: 3.8 10*3/uL (ref 1.4–7.7)
Platelets: 246 10*3/uL (ref 150.0–400.0)
RDW: 13.3 % (ref 11.5–14.6)

## 2010-07-27 LAB — LIPID PANEL: VLDL: 49.8 mg/dL — ABNORMAL HIGH (ref 0.0–40.0)

## 2010-07-27 LAB — TSH: TSH: 1.76 u[IU]/mL (ref 0.35–5.50)

## 2010-07-27 LAB — HEMOGLOBIN A1C: Hgb A1c MFr Bld: 6.7 % — ABNORMAL HIGH (ref 4.6–6.5)

## 2010-07-27 NOTE — Letter (Signed)
November 08, 2007    Venia Carbon, MD  [address] [place], [place] [address]   RE:  Jaclyn, Peterson  MRN:  [id number]  /  DOB:  1940/04/04   Dear Dr. Silvio Pate:   It is my pleasure to see Jaclyn Peterson as an outpatient for  evaluation of shortness of breath on November 08, 2007.   As you know, she is a delightful 70 year old woman with diabetes and  hyperlipidemia, but no past history of cardiac disease.  She has  previously suffered from chronic abdominal pain.  Once this problem  improved, she has experienced a great deal of weight gain.  She  attributes this to improvement in her appetite.  Over the past 6 months,  she has actually gained 40 pounds by her report.  Accompanied with the  weight gain, she describes exertional dyspnea.  She has shortness of  breath with fairly low level activity such as carrying groceries on  level ground.  She is able to walk for one mile without symptoms, but  any increased level of activity such as carrying items or walking  upstairs cause shortness of breath.  She denies orthopnea, PND, or  edema.  She denies chest pain or chest pressure.  Her symptoms resolves  rapidly after resting.  She also complains of generalized fatigue over  the same time period.   Ms. Raybourn had been the primary caretaker of her husband who was  chronically ill for many years.  He died early in 09-May-2005 and she has  developed chronic lower abdominal pain syndrome following his death.  She had been treated with OxyContin and other narcotics which did not  help her problems.  She was hospitalized and her narcotics were  discontinued and her pain symptoms resolved soon thereafter.  As  detailed above, with resolution of her abdominal pain, she has  experienced significant weight gain.   PAST MEDICAL HISTORY:  1. Type 2 diabetes, diagnosed May 10, 1999.  Worsening glycemic control      recently.  2. Hyperlipidemia.  The patient takes Lipitor.  Lipid panel  from      November 05, 2007 showed a cholesterol of 227, triglycerides 724, HDL      33, direct LDL was 69.  3. Depression.  4. Hysterectomy in 10-May-1983 with history of uterine cancer.  5. Back surgery in 05-09-73.  6. Bladder suspension in 05-09-94.   MEDICATIONS:  1. Lipitor 10 mg daily.  2. Fosamax 70 mg weekly.  3. Norvasc 5 mg daily.  4. Prozac 40 mg daily.  5. Cymbalta 60 mg daily.  6. Darvocet 4 daily.  7. Aspirin 81 mg daily.  8. Valium as needed.  9. Inderal as needed.   ALLERGIES:  1. ASPIRIN.  2. TETRACYCLINE.  3. SULFA.   SOCIAL HISTORY:  The patient is widowed.  She has two grown children.  She is a retired Engineer, maintenance.  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  She smokes 2-3 cigarettes daily.  She does  not drink alcohol.   FAMILY HISTORY:  Her mother died at age 69 of a stroke.  Father died at  age 37 of heart failure.  He had coronary bypass surgery at age 66.  She  has a sister who is 13 and is alive and well.   REVIEW OF SYSTEMS:  Pertinent positives included constipation,  generalized fatigue.  12-point review of systems was performed and all  other systems were  negative.   PHYSICAL EXAMINATION:  GENERAL:  The patient is alert and oriented.  She  is in no acute distress.  VITAL SIGNS:  Weight is 181 pounds, blood pressure is 160/76, heart rate  62, respiratory rate 16.  HEENT:  Normal.  NECK:  Normal carotid upstrokes.  No bruits.  No thyromegaly or thyroid  nodules.  LUNGS:  Clear bilaterally.  HEART:  Apex is discrete, nondisplaced.  Heart has regular rate and  rhythm.  There are no murmurs or gallops.  There is no right ventricular  heave or lift.  ABDOMEN:  Soft, obese, nontender.  No organomegaly.  No bruits.  BACK:  No CVA tenderness.  EXTREMITIES:  No clubbing, cyanosis, or edema.  Peripheral pulses are 2+  and equal throughout.  SKIN:  Warm and dry.  There is no rash.  NEUROLOGIC:  Cranial nerves II through XII are intact.   Strength is  intact and equal bilaterally.   EKG shows sinus rhythm and is within normal limits.   ASSESSMENT:  1. Exertional dyspnea.  There is no obvious evidence of structural      heart disease based on her normal physical examination and EKG.  I      suspect that her symptoms are related to weight gain.  She really      has had dramatic weight gain over the last 6 months and this degree      of weight gain could certainly explain exertional dyspnea.  In the      setting of diabetes and high blood pressure at today's office      visit, I think it is reasonable to evaluate her with a 2D echo to      rule out structural heart disease, left ventricular hypertrophy, or      abnormal diastolic parameters which could provide a cardiac      explanation for her dyspnea.  I spent a long time discussing the      weight loss strategy and dietary changes.  She is eating a very      high carbohydrate diet at present.  We will follow up with Ms.      Monger after the results of her echo are available.  If her      echocardiogram is normal, she will not require long term cardiac      follow up unless problems develop.  2. Dyslipidemia.  Lipids were just checked a few days ago and show      significant hypertriglyceridemia with low high-density lipoprotein.      She is on low-dose Lipitor, consideration could be made for      addition of Trilipix, but I will leave this to Dr. Alla German      discretion.  3. High blood pressure.  The patient's blood pressure is elevated in      the office today.  Longitudinal follow up will be necessary but in      the setting of diabetes would have a low threshold for treatment      with an ACE inhibitor.  Again, we will leave to Dr. Alla German      discretion.   For followup, we will review echo results and determine any further  evaluation that may be necessary.  If her echo is normal, I am hopeful  that with weight loss, her exertional dyspnea will  resolve.   Thanks again for the opportunity to see Ms. Vawter.  Please feel  free to call at  any time with questions.    Sincerely,      Juanda Bond. Burt Knack, MD  Electronically Signed    MDC/MedQ  DD: 11/08/2007  DT: 11/09/2007  Job #: [id number]

## 2010-08-02 ENCOUNTER — Ambulatory Visit (INDEPENDENT_AMBULATORY_CARE_PROVIDER_SITE_OTHER): Payer: Medicare Other | Admitting: Family Medicine

## 2010-08-02 ENCOUNTER — Encounter: Payer: Self-pay | Admitting: Family Medicine

## 2010-08-02 VITALS — BP 130/80 | HR 60 | Temp 98.1°F | Wt 170.1 lb

## 2010-08-02 DIAGNOSIS — J40 Bronchitis, not specified as acute or chronic: Secondary | ICD-10-CM

## 2010-08-02 MED ORDER — AZITHROMYCIN 250 MG PO TABS: ORAL_TABLET | ORAL | Status: AC

## 2010-08-02 NOTE — Assessment & Plan Note (Signed)
Treat with zpack. Red flags to return discussed. mucinex to break up mucous.  cheratussin for cough at night.

## 2010-08-02 NOTE — Progress Notes (Signed)
  Subjective:    Patient ID: Jaclyn Peterson, female    DOB: 1940-11-11, 70 y.o.   MRN: [id number]  HPI CC: cough  1 wk h/o coughing, worse when laying down.  Productive of mild clear/tan sputum.  Feels tired, fatigued, malaise.  Tried nyquil nighttime cough which helped some.  Also taking halls cough drops.  No fevers/chills, nasal congestion, RN, sneezing, ST, abd pain, n/v/d, rashes, myalgia/arthralgia.  No HA.  No ear pain or tooth pain.  H/o PNA 03/2010, hospitalized with this as well as UTI, inner ear infection.  Now back home.  No sick contacts at home.  Pt social smoker.  + daughter and husband smoke at home.  No h/o asthma.  Review of Systems Per HPI    Objective:   Physical Exam  Vitals reviewed. Constitutional: She appears well-developed and well-nourished. No distress.  HENT:  Head: Normocephalic and atraumatic.  Right Ear: Tympanic membrane, external ear and ear canal normal.  Left Ear: Tympanic membrane, external ear and ear canal normal.  Nose: No mucosal edema or rhinorrhea. Right sinus exhibits no maxillary sinus tenderness and no frontal sinus tenderness. Left sinus exhibits no maxillary sinus tenderness and no frontal sinus tenderness.  Mouth/Throat: Uvula is midline, oropharynx is clear and moist and mucous membranes are normal. No oropharyngeal exudate.  Eyes: Conjunctivae and EOM are normal. Pupils are equal, round, and reactive to light. No scleral icterus.  Neck: Normal range of motion. Neck supple. No thyromegaly present.  Cardiovascular: Normal rate, regular rhythm, normal heart sounds and intact distal pulses.   No murmur heard. Pulmonary/Chest: Effort normal. No accessory muscle usage. Not tachypneic. No respiratory distress. She has no wheezes. She has no rales.       Inspiratory rhonchi stemming from upper airways  Lymphadenopathy:    She has no cervical adenopathy.  Skin: Skin is warm and dry. No rash noted.          Assessment & Plan:

## 2010-08-02 NOTE — Patient Instructions (Signed)
Sounds like bronchitis. Treat with a zpack. Push fluids and rest. May use simple mucinex (OTC) with plenty of fluid to mobilize mucous. nyquil for cough at night. If not improving as expected, or any fever >101, worsening cough or shortness of breath, return to be seen again.

## 2010-08-06 ENCOUNTER — Other Ambulatory Visit: Payer: Self-pay | Admitting: Internal Medicine

## 2010-08-08 NOTE — Telephone Encounter (Signed)
Okay to refill #60 x 1

## 2010-08-18 ENCOUNTER — Other Ambulatory Visit: Payer: Self-pay | Admitting: Internal Medicine

## 2010-08-18 ENCOUNTER — Other Ambulatory Visit: Payer: Self-pay | Admitting: *Deleted

## 2010-08-18 NOTE — Telephone Encounter (Signed)
Faxed requests from [facility] [address] are on your desk, they are also requesting flexeril, which is no longer on med list.  Please check quantity and refills.

## 2010-08-19 MED ORDER — TRAMADOL HCL 50 MG PO TABS: 50.0000 mg | ORAL_TABLET | Freq: Two times a day (BID) | ORAL | Status: DC | PRN

## 2010-08-19 NOTE — Telephone Encounter (Signed)
Tramadol sent Let them know that the cyclobenzaprine has been discontinued for her

## 2010-08-30 ENCOUNTER — Other Ambulatory Visit: Payer: Self-pay | Admitting: *Deleted

## 2010-08-30 NOTE — Telephone Encounter (Signed)
Denied We just sent 6 month rx to [facility]

## 2010-08-30 NOTE — Telephone Encounter (Signed)
Fax is on your desk .

## 2010-08-31 ENCOUNTER — Telehealth: Payer: Self-pay | Admitting: *Deleted

## 2010-08-31 MED ORDER — DIAZEPAM 2 MG PO TABS: 2.0000 mg | ORAL_TABLET | Freq: Two times a day (BID) | ORAL | Status: DC | PRN

## 2010-08-31 NOTE — Telephone Encounter (Signed)
Diazepam called to [facility] [address]

## 2010-08-31 NOTE — Telephone Encounter (Signed)
Laurie sent in rx, per Dr.Letvak he was looking at the wrong medication

## 2010-08-31 NOTE — Telephone Encounter (Signed)
Sorry, I saw the wrong date Okay to refill for 3 months---- #180 x 0 or #60 x 2

## 2010-08-31 NOTE — Telephone Encounter (Signed)
Pt called, upset that her diazepam script was denied yesterday.  She says the last script she got for this was in November, written for 180 with 1 refill.  This has run out this month.  I couldn't see where it has been refilled since.  [facility] [address]

## 2010-09-16 ENCOUNTER — Other Ambulatory Visit: Payer: Self-pay | Admitting: *Deleted

## 2010-09-16 MED ORDER — TRAMADOL HCL 50 MG PO TABS: 50.0000 mg | ORAL_TABLET | Freq: Two times a day (BID) | ORAL | Status: DC | PRN

## 2010-09-16 NOTE — Telephone Encounter (Signed)
Faxed request [facility] [address] is on your desk.

## 2010-09-17 ENCOUNTER — Other Ambulatory Visit: Payer: Self-pay | Admitting: *Deleted

## 2010-09-17 MED ORDER — METHOCARBAMOL 500 MG PO TABS: 500.0000 mg | ORAL_TABLET | Freq: Three times a day (TID) | ORAL | Status: DC | PRN

## 2010-09-17 MED ORDER — DULOXETINE HCL 60 MG PO CPEP: 60.0000 mg | ORAL_CAPSULE | Freq: Every day | ORAL | Status: DC

## 2010-09-17 NOTE — Telephone Encounter (Signed)
rx sent to pharmacy by e-script  

## 2010-09-17 NOTE — Telephone Encounter (Signed)
Okay robaxin #90 x 0 cymbalta okay x 1 year

## 2010-10-04 ENCOUNTER — Other Ambulatory Visit: Payer: Self-pay | Admitting: *Deleted

## 2010-10-04 MED ORDER — TRAMADOL HCL 50 MG PO TABS: 50.0000 mg | ORAL_TABLET | Freq: Two times a day (BID) | ORAL | Status: DC | PRN

## 2010-10-04 NOTE — Telephone Encounter (Signed)
Patient advised by Margarita Grizzle, and rx called in.

## 2010-10-04 NOTE — Telephone Encounter (Signed)
Patient says that she fell about 2 weeks ago and hurt her back and has been taking more tramadol than usual. She has been taking 4 a day instead of 2 a day. She is asking if she can get a refill. [facility]ses [facility] [address]

## 2010-10-04 NOTE — Telephone Encounter (Signed)
Okay to fill emergency supply of #30 x 0 She has gotten into trouble with pain meds in past She is not to increase her meds without seeing me or discussing it again! No more till August 6th at the earliest

## 2010-10-15 ENCOUNTER — Other Ambulatory Visit: Payer: Self-pay | Admitting: *Deleted

## 2010-10-15 MED ORDER — TRAMADOL HCL 50 MG PO TABS: 50.0000 mg | ORAL_TABLET | Freq: Two times a day (BID) | ORAL | Status: DC | PRN

## 2010-10-15 MED ORDER — METHOCARBAMOL 500 MG PO TABS: 500.0000 mg | ORAL_TABLET | Freq: Three times a day (TID) | ORAL | Status: DC | PRN

## 2010-10-15 NOTE — Telephone Encounter (Signed)
Rx called to pharmacy

## 2010-11-16 ENCOUNTER — Other Ambulatory Visit: Payer: Self-pay | Admitting: Family Medicine

## 2010-11-16 NOTE — Telephone Encounter (Signed)
rxs send electronically

## 2010-12-01 ENCOUNTER — Other Ambulatory Visit: Payer: Self-pay | Admitting: *Deleted

## 2010-12-01 MED ORDER — DIAZEPAM 2 MG PO TABS: 2.0000 mg | ORAL_TABLET | Freq: Two times a day (BID) | ORAL | Status: DC | PRN

## 2010-12-01 NOTE — Telephone Encounter (Signed)
Form on your desk  

## 2010-12-01 NOTE — Telephone Encounter (Signed)
rx called into pharmacy, for 22m tablet.

## 2010-12-01 NOTE — Telephone Encounter (Signed)
She was changed to 30m diazepam some time ago Please confirm with the pharmacy Okay #180 x 0 at the correct dosage

## 2010-12-10 ENCOUNTER — Telehealth: Payer: Self-pay | Admitting: *Deleted

## 2010-12-10 NOTE — Telephone Encounter (Signed)
Form done Can only test once a day since not on insulin

## 2010-12-10 NOTE — Telephone Encounter (Signed)
Form faxed back and scanned

## 2010-12-10 NOTE — Telephone Encounter (Signed)
Form for diabetic supplies is on your desk.  I spoke with the patient and she does want these supplies.

## 2010-12-13 ENCOUNTER — Other Ambulatory Visit: Payer: Self-pay | Admitting: *Deleted

## 2010-12-13 ENCOUNTER — Other Ambulatory Visit: Payer: Self-pay | Admitting: Internal Medicine

## 2010-12-13 MED ORDER — LIDOCAINE 5 % EX PTCH: 1.0000 | MEDICATED_PATCH | CUTANEOUS | Status: DC

## 2010-12-13 NOTE — Telephone Encounter (Signed)
rx sent to pharmacy by e-script Spoke with patient and advised results

## 2010-12-13 NOTE — Telephone Encounter (Signed)
Okay to send Rx for lidoderm patches #30 x 5  No increase in diazepam That is too high a dose for her and she wound up in the hospital with all those meds she was on before Has to stick with the 2 mg or we can discuss alternatives at the next visit

## 2010-12-13 NOTE — Telephone Encounter (Signed)
Pt is asking for refill on lidoderm patches 5%, previously prescribed by her pain management doctor, Dr. Phineas Douglas, who she doesn't see anymore.  [facility] [place], she says diazepam 2 mg's doesn't help her spasms,she has been on 5 mg's. Requests that that dose be called in.  She says pharmacy only gave her #60 on the last script because they told her that was all they had.

## 2010-12-22 ENCOUNTER — Other Ambulatory Visit: Payer: Self-pay | Admitting: Internal Medicine

## 2011-01-03 ENCOUNTER — Ambulatory Visit (INDEPENDENT_AMBULATORY_CARE_PROVIDER_SITE_OTHER): Payer: Medicare Other | Admitting: Family Medicine

## 2011-01-03 ENCOUNTER — Encounter: Payer: Self-pay | Admitting: Family Medicine

## 2011-01-03 VITALS — BP 118/62 | HR 54 | Temp 99.6°F | Ht 67.0 in | Wt 163.5 lb

## 2011-01-03 DIAGNOSIS — J40 Bronchitis, not specified as acute or chronic: Secondary | ICD-10-CM

## 2011-01-03 MED ORDER — ALBUTEROL SULFATE HFA 108 (90 BASE) MCG/ACT IN AERS: 2.0000 | INHALATION_SPRAY | Freq: Four times a day (QID) | RESPIRATORY_TRACT | Status: DC | PRN

## 2011-01-03 MED ORDER — AZITHROMYCIN 250 MG PO TABS: ORAL_TABLET | ORAL | Status: AC

## 2011-01-03 NOTE — Patient Instructions (Signed)
Take antibiotic as directed.  Drink lots of fluids.  Treat sympotmatically with Mucinex, nasal saline irrigation, and Tylenol/Ibuprofen.Cough suppressant at night. Call if not improving as expected in 5-7 days.

## 2011-01-03 NOTE — Progress Notes (Signed)
  Subjective:    Patient ID: Jaclyn Peterson, female    DOB: [birth year], 70 y.o.   MRN: [id number]  HPI CC: cough 70 you with 1 week of productive cough, now with fever, Tmax 100.  Tried nyquil nighttime cough which helped some.  Also taking halls cough drops.  No  nasal congestion, sneezing, ST, abd pain, n/v/d, rashes, myalgia/arthralgia.   No HA.  No ear pain or tooth pain.  H/o PNA 03/2010 and bronchitis in April.  No sick contacts at home.  Pt social smoker.  + daughter and husband smoke at home.  No h/o asthma.  Review of Systems Per HPI    Objective:   Physical Exam  BP 118/62  Pulse 54  Temp(Src) 99.6 F (37.6 C) (Oral)  Ht 5' 7"  (1.702 m)  Wt 163 lb 8 oz (74.163 kg)  BMI 25.61 kg/m2  Constitutional: She appears well-developed and well-nourished. No distress.  HENT:  Head: Normocephalic and atraumatic.  Right Ear: Tympanic membrane, external ear and ear canal normal.  Left Ear: Tympanic membrane, external ear and ear canal normal.  Nose: No mucosal edema or rhinorrhea. Right sinus exhibits no maxillary sinus tenderness and no frontal sinus tenderness. Left sinus exhibits no maxillary sinus tenderness and no frontal sinus tenderness.  Mouth/Throat: Uvula is midline, oropharynx is clear and moist and mucous membranes are normal. No oropharyngeal exudate.  Eyes: Conjunctivae and EOM are normal. Pupils are equal, round, and reactive to light. No scleral icterus.  Neck: Normal range of motion. Neck supple. No thyromegaly present.  Cardiovascular: Normal rate, regular rhythm, normal heart sounds and intact distal pulses.   No murmur heard. Pulmonary/Chest: Effort normal. No accessory muscle usage. Not tachypneic. No respiratory distress. She has no rales.     Bilateral rhonchi with faint scattered exp wheezes  Lymphadenopathy:    She has no cervical adenopathy.  Skin: Skin is warm and dry. No rash noted.      Assessment & Plan:   1. Bronchitis    New.  Does  seem consistent with bronchitis. Will treat with Zpack, albuterol as needed. Continue cough suppressant. See pt instructions for complete details.

## 2011-01-04 ENCOUNTER — Telehealth: Payer: Self-pay | Admitting: *Deleted

## 2011-01-04 MED ORDER — CHLORPHENIRAMINE-HYDROCODONE 8-10 MG/5ML PO LQCR: 5.0000 mL | Freq: Two times a day (BID) | ORAL | Status: DC | PRN

## 2011-01-04 NOTE — Telephone Encounter (Signed)
Please call in rx as written below.

## 2011-01-04 NOTE — Telephone Encounter (Signed)
Patient advised via telephone, [facility]

## 2011-01-04 NOTE — Telephone Encounter (Signed)
Pt was seen yesterday for bronchitis.  She would like to have something called in for the cough, maybe something strong that she can take at night.  Her ribs are so sore from coughing that it hurts her to lay down.  [facility]

## 2011-01-11 ENCOUNTER — Other Ambulatory Visit: Payer: Self-pay | Admitting: Internal Medicine

## 2011-01-25 ENCOUNTER — Encounter: Payer: Self-pay | Admitting: Family Medicine

## 2011-01-25 ENCOUNTER — Ambulatory Visit (INDEPENDENT_AMBULATORY_CARE_PROVIDER_SITE_OTHER): Payer: Medicare Other | Admitting: Family Medicine

## 2011-01-25 VITALS — BP 116/64 | HR 54 | Temp 98.4°F | Wt 163.8 lb

## 2011-01-25 DIAGNOSIS — J40 Bronchitis, not specified as acute or chronic: Secondary | ICD-10-CM

## 2011-01-25 MED ORDER — BENZONATATE 100 MG PO CAPS: 100.0000 mg | ORAL_CAPSULE | Freq: Three times a day (TID) | ORAL | Status: DC | PRN

## 2011-01-25 MED ORDER — AZITHROMYCIN 250 MG PO TABS: ORAL_TABLET | ORAL | Status: DC

## 2011-01-25 MED ORDER — LEVOFLOXACIN 500 MG PO TABS: 500.0000 mg | ORAL_TABLET | Freq: Every day | ORAL | Status: DC

## 2011-01-25 NOTE — Patient Instructions (Addendum)
I do think you have continued bronchitis- treat with levaquin x7 days. Update [place] if fever >101, or worsening cough instead of improving. Push fluids and plenty of rest.  Continue cough drops.  May use tessalon perls for cough.

## 2011-01-25 NOTE — Assessment & Plan Note (Signed)
Anticipate recurrence of bronchitis.  Recent zpack use. Treat with levaquin x7 d, update [place] if not improving as expected. Tessalon perls for cough.

## 2011-01-25 NOTE — Progress Notes (Signed)
  Subjective:    Patient ID: Jaclyn Peterson, female    DOB: [birth year], 70 y.o.   MRN: [id number]  HPI CC: cough  70 yo seen 01/03/2011 with dx bronchitis, treated with zpack and albuterol inh.  Started feeling better, continued cough.  Over weekend started feeling ill again - more fatigued, tired, run down, head congestion.  Cough getting worse, notices especially when laying on left side.  Cough productive of clear or pink mucous.  Taking nyquil which helped some. Also taking halls cough drops.   No fevers/chills, abd pain, n/v/d, ST.  No HA. No ear pain or tooth pain.  No CP/SOB.  H/o PNA 03/2010 and bronchitis in April.   + sick contacts, family at home.  Pt social smoker. + daughter and husband smoke at home.  No h/o asthma/COPD.  [facility]  Review of Systems Per HPI    Objective:   Physical Exam  Nursing note and vitals reviewed. Constitutional: She appears well-developed and well-nourished. No distress.       Mildly diaphoretic  HENT:  Head: Normocephalic and atraumatic.  Right Ear: Hearing, tympanic membrane, external ear and ear canal normal.  Left Ear: Hearing, tympanic membrane, external ear and ear canal normal.  Nose: No mucosal edema or rhinorrhea. Right sinus exhibits no maxillary sinus tenderness and no frontal sinus tenderness. Left sinus exhibits no maxillary sinus tenderness and no frontal sinus tenderness.  Mouth/Throat: Uvula is midline, oropharynx is clear and moist and mucous membranes are normal. No oropharyngeal exudate, posterior oropharyngeal edema, posterior oropharyngeal erythema or tonsillar abscesses.  Eyes: Conjunctivae and EOM are normal. Pupils are equal, round, and reactive to light. No scleral icterus.  Neck: Normal range of motion. Neck supple. No JVD present. No thyromegaly present.  Cardiovascular: Normal rate, regular rhythm, normal heart sounds and intact distal pulses.   No murmur heard. Pulmonary/Chest:  Effort normal and breath sounds normal. No respiratory distress. She has no wheezes. She has no rales.  Lymphadenopathy:    She has no cervical adenopathy.  Skin: Skin is warm and dry. No rash noted.       Assessment & Plan:

## 2011-01-31 ENCOUNTER — Ambulatory Visit (INDEPENDENT_AMBULATORY_CARE_PROVIDER_SITE_OTHER): Payer: Medicare Other | Admitting: Internal Medicine

## 2011-01-31 ENCOUNTER — Encounter: Payer: Self-pay | Admitting: Internal Medicine

## 2011-01-31 VITALS — BP 126/57 | HR 54 | Temp 97.6°F | Ht 67.0 in | Wt 163.0 lb

## 2011-01-31 DIAGNOSIS — F329 Major depressive disorder, single episode, unspecified: Secondary | ICD-10-CM

## 2011-01-31 DIAGNOSIS — F3289 Other specified depressive episodes: Secondary | ICD-10-CM

## 2011-01-31 DIAGNOSIS — I10 Essential (primary) hypertension: Secondary | ICD-10-CM

## 2011-01-31 DIAGNOSIS — E1149 Type 2 diabetes mellitus with other diabetic neurological complication: Secondary | ICD-10-CM

## 2011-01-31 DIAGNOSIS — G8929 Other chronic pain: Secondary | ICD-10-CM

## 2011-01-31 NOTE — Assessment & Plan Note (Signed)
Mood has been fine Will continue ongoing Rx with both agents

## 2011-01-31 NOTE — Assessment & Plan Note (Signed)
Lab Results  Component Value Date   HGBA1C 6.7* 07/26/2010   Good control still Happy with cymbalta for neuropathic foot pain

## 2011-01-31 NOTE — Assessment & Plan Note (Signed)
Satisfied with lidoderm and tramadol for her back

## 2011-01-31 NOTE — Progress Notes (Signed)
Subjective:    Patient ID: Jaclyn Peterson, female    DOB: June 03, 1940, 70 y.o.   MRN: [id number]  HPI Has had several bouts with bronchitis Feels better finally Just finishing the levaquin Relapsed after taking z-pak Able to sleep with nyquil cough---helps her sleep. Or tylenol PM Tessalon helped as well  Checks sugars about once a day 108-120 fasting No hypoglycemic reactions Keeps up with the eye doctor  No chest pain Breathing is okay No sig edema No headaches  Pain syndrome is better Feels comfortable with continuing all her meds Abdominal pain is gone Still with regular back pain but is satisfied with the control---lidoderm and tramadol mostly  No problems with depression Still has daughter, SIL, kids and pets  Current Outpatient Prescriptions on File Prior to Visit  Medication Sig Dispense Refill  . albuterol (PROVENTIL HFA;VENTOLIN HFA) 108 (90 BASE) MCG/ACT inhaler Inhale 2 puffs into the lungs every 6 (six) hours as needed for wheezing.  1 Inhaler  0  . amLODipine (NORVASC) 10 MG tablet TAKE 1 TABLET DAILY  90 tablet  3  . aspirin 81 MG tablet Take 81 mg by mouth daily.        . Calcium Carb-Cholecalciferol (OS-CAL 500 + D) 500-600 MG-UNIT TABS Take by mouth daily.        . cholestyramine (QUESTRAN) 4 G packet Take 1 packet by mouth daily.        . diazepam (VALIUM) 2 MG tablet Take 1 tablet (2 mg total) by mouth 2 (two) times daily as needed.  180 tablet  0  . DULoxetine (CYMBALTA) 60 MG capsule Take 1 capsule (60 mg total) by mouth daily.  90 capsule  3  . Estropipate (OGEN 0.625 PO) Take 1 tablet by mouth daily.        Marland Kitchen FLUoxetine (PROZAC) 40 MG capsule TAKE 1 CAPSULE DAILY  90 capsule  3  . lidocaine (LIDODERM) 5 % Place 1 patch onto the skin daily. Remove & Discard patch within 12 hours or as directed by MD  30 patch  5  . metFORMIN (GLUCOPHAGE) 500 MG tablet TAKE 1 TABLET TWICE A DAY  180 tablet  2  . methocarbamol (ROBAXIN) 500 MG tablet TAKE 1 TABLET  BY MOUTH 3 TIMES A DAY  90 tablet  0  . ONE TOUCH ULTRA TEST test strip TEST DAILY OR AS DIRECTED  100 each  3  . propranolol (INDERAL) 20 MG tablet Take 20 mg by mouth 2 (two) times daily.        Marland Kitchen senna-docusate (SENOKOT-S) 8.6-50 MG per tablet Take 1 tablet by mouth 2 (two) times daily.        . simvastatin (ZOCOR) 20 MG tablet TAKE 1 TABLET DAILY FOR HIGH BLOOD PRESSURE  90 tablet  3  . traMADol (ULTRAM) 50 MG tablet TAKE 1 TABLET (50 MG TOTAL) BY MOUTH 2 (TWO) TIMES DAILY AS NEEDED FOR PAIN. MAX DOSE IS 4PER DAY  60 tablet  0    Allergies  Allergen Reactions  . Bisoprolol Fumarate     REACTION: ha  . Chlorthalidone   . Lisinopril     REACTION: throat symptoms  . Tetracyclines & Related Swelling    Throat swelling    Past Medical History  Diagnosis Date  . Depression   . Diabetes mellitus   . Hyperlipidemia   . Hypertension   . Osteoporosis   . 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 Chronic pain   .  SBO (small bowel obstruction)     obstipation  10/2005  . Falls frequently     Falls/pneumonia 1/12    Past Surgical History  Procedure Date  . Appendectomy     1946  Adhesions shortly after  . Abdominal hysterectomy     BSO 1985  cancer  . Kidney stone surgery     cysto-- [place] 2003  . Bladder repair     bladder tacking- chronic pain since  1998  . Back surgery 1970's  . Cholecystectomy     lysis of adhesions--post op complications, trach, etc  8/10    Family History  Problem Relation Age of Onset  . Coronary artery disease Father   . Hypertension Neg Hx   . Diabetes Neg Hx     History   Social History  . Marital Status: Married    Spouse Name: N/A    Number of Children: 2  . Years of Education: N/A   Occupational History  . 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 Smoking status: Current Some Day Smoker  . Smokeless tobacco: Not on file   Comment: only once in a while. Gave info on 1-800-QUIT NOW  . Alcohol Use: No   . Drug Use: No  . Sexually Active: Not on file   Other Topics Concern  . Not on file   Social History Narrative   Enjoys craftsRegular Exercise - no   Review of Systems Weight is stable Appetite is fine Sleeps pretty well in general     Objective:   Physical Exam  Constitutional: She appears well-developed and well-nourished. No distress.  Neck: Normal range of motion. Neck supple. No thyromegaly present.  Cardiovascular: Normal rate, regular rhythm and normal heart sounds.  Exam reveals no gallop.   No murmur heard.      Feet cool ?slight faint pulses in feet  Pulmonary/Chest: Effort normal and breath sounds normal. No respiratory distress. She has no wheezes. She has no rales.  Musculoskeletal: Normal range of motion. She exhibits no edema and no tenderness.  Lymphadenopathy:    She has no cervical adenopathy.  Psychiatric: She has a normal mood and affect. Her behavior is normal. Judgment and thought content normal.          Assessment & Plan:

## 2011-01-31 NOTE — Assessment & Plan Note (Signed)
BP Readings from Last 3 Encounters:  01/31/11 126/57  01/25/11 116/64  01/03/11 118/62   Good control No changes needed

## 2011-02-01 LAB — HEMOGLOBIN A1C: Hgb A1c MFr Bld: 6.5 % (ref 4.6–6.5)

## 2011-02-09 ENCOUNTER — Other Ambulatory Visit: Payer: Self-pay | Admitting: Internal Medicine

## 2011-02-09 NOTE — Telephone Encounter (Signed)
Rxs sent electronically.  

## 2011-02-12 HISTORY — PX: CATARACT EXTRACTION: SUR2

## 2011-02-28 ENCOUNTER — Other Ambulatory Visit: Payer: Self-pay | Admitting: *Deleted

## 2011-02-28 NOTE — Telephone Encounter (Signed)
OK to refill

## 2011-03-01 MED ORDER — DIAZEPAM 2 MG PO TABS: 2.0000 mg | ORAL_TABLET | Freq: Two times a day (BID) | ORAL | Status: DC | PRN

## 2011-03-01 NOTE — Telephone Encounter (Signed)
Okay #180 x 0

## 2011-03-01 NOTE — Telephone Encounter (Signed)
rx called into pharmacy

## 2011-03-09 ENCOUNTER — Other Ambulatory Visit: Payer: Self-pay | Admitting: Internal Medicine

## 2011-03-18 ENCOUNTER — Ambulatory Visit (INDEPENDENT_AMBULATORY_CARE_PROVIDER_SITE_OTHER): Payer: Medicare Other | Admitting: Family Medicine

## 2011-03-18 ENCOUNTER — Encounter: Payer: Self-pay | Admitting: Family Medicine

## 2011-03-18 VITALS — BP 152/62 | HR 56 | Temp 98.4°F | Resp 14 | Ht 65.5 in | Wt 156.5 lb

## 2011-03-18 DIAGNOSIS — J40 Bronchitis, not specified as acute or chronic: Secondary | ICD-10-CM

## 2011-03-18 MED ORDER — AZITHROMYCIN 250 MG PO TABS: ORAL_TABLET | ORAL | Status: AC

## 2011-03-18 MED ORDER — PREDNISONE 20 MG PO TABS: 40.0000 mg | ORAL_TABLET | Freq: Every day | ORAL | Status: AC

## 2011-03-18 MED ORDER — BENZONATATE 100 MG PO CAPS: 100.0000 mg | ORAL_CAPSULE | Freq: Two times a day (BID) | ORAL | Status: AC | PRN

## 2011-03-18 MED ORDER — ALBUTEROL SULFATE HFA 108 (90 BASE) MCG/ACT IN AERS: 2.0000 | INHALATION_SPRAY | Freq: Four times a day (QID) | RESPIRATORY_TRACT | Status: DC | PRN

## 2011-03-18 NOTE — Assessment & Plan Note (Signed)
Anticipate return of bronchitis.  Treat aggressively with zpack given h/o PNA in past. Provide short course of prednisone - suspicious for COPD developing chronic bronchitis, and tessalon/albuterol prn. Watch sugars closely given h/o DM.

## 2011-03-18 NOTE — Progress Notes (Signed)
  Subjective:    Patient ID: Jaclyn Peterson, female    DOB: 19-Oct-1940, 71 y.o.   MRN: [id number]  HPI CC: cough  Pleasant 71 yo pt of Dr. Everardo Beals with recent eval 12/2010 (bronchitis treatd with zpack) and then 01/2011 (bronchitis treated with levaquin) presents with 1wk h/o progressively worsening cough.  Coughing at night worse than during day.  Feeling wheezy with exhalations.  Last night worse coughing.  Bringing up more sputum than normal.  More purulent sputum production.  More SOB than normal.  Taking nyquil cough.    No fevers/chills, abd pain, n/v, chest pain.  Has had good results with tessalon perls and albuterol inhaler.  bp somewhat elevated - took meds this morning.  Stopped smoking 2/2 coughing.  Does smoke 2 cig/day.  + sick contacts (daughter and grandchildren).  Has been on steroids in past.  No h/o asthma, COPD.  Recently around 4 cats, not prior.  Tends to get PNA.  Review of Systems Per HPI    Objective:   Physical Exam  Nursing note and vitals reviewed. Constitutional: She appears well-developed and well-nourished. No distress.  HENT:  Head: Normocephalic and atraumatic.  Right Ear: Hearing, tympanic membrane, external ear and ear canal normal.  Left Ear: Hearing, tympanic membrane, external ear and ear canal normal.  Nose: Nose normal. No mucosal edema or rhinorrhea. Right sinus exhibits no maxillary sinus tenderness and no frontal sinus tenderness. Left sinus exhibits no maxillary sinus tenderness and no frontal sinus tenderness.  Mouth/Throat: Uvula is midline, oropharynx is clear and moist and mucous membranes are normal. No oropharyngeal exudate, posterior oropharyngeal edema, posterior oropharyngeal erythema or tonsillar abscesses.  Eyes: Conjunctivae and EOM are normal. Pupils are equal, round, and reactive to light. No scleral icterus.  Neck: Normal range of motion. Neck supple.  Cardiovascular: Normal rate, regular rhythm, normal heart sounds  and intact distal pulses.   No murmur heard. Pulmonary/Chest: Effort normal. No respiratory distress. She has no decreased breath sounds. She has no wheezes. She has rhonchi in the right lower field. She has no rales.  Lymphadenopathy:    She has no cervical adenopathy.  Skin: Skin is warm and dry. No rash noted.      Assessment & Plan:

## 2011-03-18 NOTE — Patient Instructions (Signed)
I think you have return of bronchitis, with concern for possibly developing COPD from smoking.  Best thing to do is quit smoking. Treat with zpack, steroid course, and refilled tessalon and albuterol. Return to see Dr. Carlean Jews when feeling better to recheck. Let us know if worsening cough, or fever >101. Good to see you, hope you start feeling better.

## 2011-03-21 ENCOUNTER — Ambulatory Visit: Payer: Medicare Other | Admitting: Family Medicine

## 2011-04-07 ENCOUNTER — Other Ambulatory Visit: Payer: Self-pay | Admitting: Internal Medicine

## 2011-04-07 NOTE — Telephone Encounter (Signed)
rx sent to pharmacy by e-script  

## 2011-04-07 NOTE — Telephone Encounter (Signed)
Okay methocarbamol #90 x 2  Tramadol #60 x 0

## 2011-05-06 ENCOUNTER — Other Ambulatory Visit: Payer: Self-pay | Admitting: Internal Medicine

## 2011-05-20 ENCOUNTER — Other Ambulatory Visit: Payer: Self-pay | Admitting: Internal Medicine

## 2011-05-27 ENCOUNTER — Other Ambulatory Visit: Payer: Self-pay | Admitting: *Deleted

## 2011-05-27 MED ORDER — DIAZEPAM 2 MG PO TABS: 2.0000 mg | ORAL_TABLET | Freq: Two times a day (BID) | ORAL | Status: DC | PRN

## 2011-05-27 NOTE — Telephone Encounter (Signed)
rx called to pharmacy 

## 2011-06-01 ENCOUNTER — Other Ambulatory Visit: Payer: Self-pay | Admitting: Internal Medicine

## 2011-06-01 NOTE — Telephone Encounter (Signed)
Rx done electronically

## 2011-06-19 ENCOUNTER — Other Ambulatory Visit: Payer: Self-pay | Admitting: Internal Medicine

## 2011-06-20 ENCOUNTER — Other Ambulatory Visit: Payer: Self-pay | Admitting: Internal Medicine

## 2011-06-29 ENCOUNTER — Other Ambulatory Visit: Payer: Self-pay | Admitting: Internal Medicine

## 2011-06-29 ENCOUNTER — Ambulatory Visit (INDEPENDENT_AMBULATORY_CARE_PROVIDER_SITE_OTHER): Payer: Medicare Other | Admitting: Internal Medicine

## 2011-06-29 ENCOUNTER — Encounter: Payer: Self-pay | Admitting: Internal Medicine

## 2011-06-29 VITALS — BP 138/72 | HR 51 | Temp 98.0°F | Ht 64.5 in | Wt 169.0 lb

## 2011-06-29 DIAGNOSIS — G8929 Other chronic pain: Secondary | ICD-10-CM

## 2011-06-29 DIAGNOSIS — F329 Major depressive disorder, single episode, unspecified: Secondary | ICD-10-CM

## 2011-06-29 MED ORDER — DULOXETINE HCL 60 MG PO CPEP: 60.0000 mg | ORAL_CAPSULE | Freq: Two times a day (BID) | ORAL | Status: DC

## 2011-06-29 NOTE — Assessment & Plan Note (Signed)
Managed well with the cymbalta

## 2011-06-29 NOTE — Assessment & Plan Note (Signed)
Had been worsening but now better since doubled cymbalta Not clearly better over 19m daily in the literature---but she has already gotten a great response Will increase and reeval in 6 weeks at routine appt

## 2011-06-29 NOTE — Progress Notes (Signed)
Subjective:    Patient ID: Jaclyn Peterson, female    DOB: January 12, 1941, 71 y.o.   MRN: [id number]  HPI Has had a terrible time with her back "out of my mind with pain" Has been taking acetaminophen 1567m every 4 hours---did cut back when she realized it is too much  Increased cymbalta from once a day to twice a day She feels this helped her Wants to know if this dose is okay Has been using this dose for ~2 weeks and she notes an incredible change  Has stress with daughter, SIL, kids and multiple pets living with her  Current Outpatient Prescriptions on File Prior to Visit  Medication Sig Dispense Refill  . amLODipine (NORVASC) 10 MG tablet TAKE 1 TABLET DAILY  90 tablet  3  . aspirin 81 MG tablet Take 81 mg by mouth daily.        . Calcium Carb-Cholecalciferol (OS-CAL 500 + D) 500-600 MG-UNIT TABS Take by mouth daily.        . cholestyramine (QUESTRAN) 4 G packet 1/2 - 1 PACK MIXED WITH WATER DAILY TO REDUCE DIARRHEA  30 packet  3  . diazepam (VALIUM) 2 MG tablet Take 1 tablet (2 mg total) by mouth 2 (two) times daily as needed.  180 tablet  0  . DULoxetine (CYMBALTA) 60 MG capsule Take 1 capsule (60 mg total) by mouth daily.  90 capsule  3  . Estropipate (OGEN 0.625 PO) Take 1 tablet by mouth daily.        .Marland KitchenFLUoxetine (PROZAC) 40 MG capsule TAKE 1 CAPSULE DAILY  90 capsule  2  . lidocaine (LIDODERM) 5 % Place 1 patch onto the skin daily. Remove & Discard patch within 12 hours or as directed by MD  30 patch  5  . metFORMIN (GLUCOPHAGE) 500 MG tablet TAKE 1 TABLET TWICE A DAY  180 tablet  2  . methocarbamol (ROBAXIN) 500 MG tablet TAKE 1 TABLET (500 MG TOTAL) BY MOUTH 3 (THREE) TIMES DAILY.  90 tablet  2  . ONE TOUCH ULTRA TEST test strip TEST DAILY OR AS DIRECTED  100 each  3  . propranolol (INDERAL) 20 MG tablet Take 20 mg by mouth 2 (two) times daily.        .Marland Kitchensenna-docusate (SENOKOT-S) 8.6-50 MG per tablet Take 1 tablet by mouth 2 (two) times daily.        . simvastatin  (ZOCOR) 20 MG tablet TAKE 1 TABLET DAILY FOR HIGH BLOOD PRESSURE  90 tablet  2  . traMADol (ULTRAM) 50 MG tablet TAKE 1 TABLET (50 MG TOTAL) BY MOUTH 2 (TWO) TIMES DAILY AS NEEDED. MAX DOSE IS 4 PER DAY  60 tablet  0  . DISCONTD: propranolol (INDERAL) 40 MG tablet TAKE ONE-HALF (1/2) TABLET TWICE A DAY  90 tablet  2    Allergies  Allergen Reactions  . Bisoprolol Fumarate     REACTION: ha  . Chlorthalidone   . Lisinopril     REACTION: throat symptoms  . Tetracyclines & Related Swelling    Throat swelling    Past Medical History  Diagnosis Date  . Depression   . Diabetes mellitus   . Hyperlipidemia   . Hypertension   . Osteoporosis   . 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 Chronic pain   . SBO (small bowel obstruction)     obstipation  10/2005  . Falls frequently     Falls/pneumonia 1/12    Past Surgical  History  Procedure Date  . Appendectomy     1946  Adhesions shortly after  . Abdominal hysterectomy     BSO 1985  cancer  . Kidney stone surgery     cysto-- [place] 2003  . Bladder repair     bladder tacking- chronic pain since  1998  . Back surgery 1970's  . Cholecystectomy     lysis of adhesions--post op complications, trach, etc  8/10    Family History  Problem Relation Age of Onset  . Coronary artery disease Father   . Hypertension Neg Hx   . Diabetes Neg Hx     History   Social History  . Marital Status: Married    Spouse Name: N/A    Number of Children: 2  . Years of Education: N/A   Occupational History  . 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 Smoking status: Current Some Day Smoker  . Smokeless tobacco: Not on file   Comment: only once in a while. Gave info on 1-800-QUIT NOW  . Alcohol Use: No  . Drug Use: No  . Sexually Active: Not on file   Other Topics Concern  . Not on file   Social History Narrative   Enjoys craftsRegular Exercise - no    Review of Systems Was sleeping too much--with tylenol  PM, etc. Stopped this and more alert now Appetite is "too good"    Objective:   Physical Exam  Constitutional: She appears well-developed and well-nourished. No distress.  Psychiatric: She has a normal mood and affect. Her behavior is normal.          Assessment & Plan:

## 2011-06-30 ENCOUNTER — Other Ambulatory Visit: Payer: Self-pay

## 2011-06-30 NOTE — Telephone Encounter (Signed)
Pt said [facility] and Tramadol and Methocarbamol not refilled yet. [facility] called [facility] [place] spoke with [facility] N[facility]. Patient notified as instructed by telephone. Pt was appreciative.

## 2011-06-30 NOTE — Telephone Encounter (Signed)
rx sent to pharmacy by e-script  

## 2011-06-30 NOTE — Telephone Encounter (Signed)
#  60 x 0 of tramadol #90 x 2 of methocarbamol

## 2011-07-11 ENCOUNTER — Other Ambulatory Visit: Payer: Self-pay | Admitting: Internal Medicine

## 2011-07-28 ENCOUNTER — Other Ambulatory Visit: Payer: Self-pay | Admitting: Internal Medicine

## 2011-08-11 ENCOUNTER — Encounter: Payer: Self-pay | Admitting: Internal Medicine

## 2011-08-11 ENCOUNTER — Ambulatory Visit (INDEPENDENT_AMBULATORY_CARE_PROVIDER_SITE_OTHER): Payer: Medicare Other | Admitting: Internal Medicine

## 2011-08-11 VITALS — BP 142/70 | HR 55 | Temp 97.9°F | Ht 64.0 in | Wt 167.0 lb

## 2011-08-11 DIAGNOSIS — E785 Hyperlipidemia, unspecified: Secondary | ICD-10-CM

## 2011-08-11 DIAGNOSIS — R799 Abnormal finding of blood chemistry, unspecified: Secondary | ICD-10-CM

## 2011-08-11 DIAGNOSIS — R7989 Other specified abnormal findings of blood chemistry: Secondary | ICD-10-CM

## 2011-08-11 DIAGNOSIS — I10 Essential (primary) hypertension: Secondary | ICD-10-CM

## 2011-08-11 DIAGNOSIS — Z23 Encounter for immunization: Secondary | ICD-10-CM

## 2011-08-11 DIAGNOSIS — IMO0001 Reserved for inherently not codable concepts without codable children: Secondary | ICD-10-CM

## 2011-08-11 DIAGNOSIS — F329 Major depressive disorder, single episode, unspecified: Secondary | ICD-10-CM

## 2011-08-11 DIAGNOSIS — F3289 Other specified depressive episodes: Secondary | ICD-10-CM

## 2011-08-11 DIAGNOSIS — E1142 Type 2 diabetes mellitus with diabetic polyneuropathy: Secondary | ICD-10-CM

## 2011-08-11 DIAGNOSIS — G8929 Other chronic pain: Secondary | ICD-10-CM

## 2011-08-11 DIAGNOSIS — Z0000 Encounter for general adult medical examination without abnormal findings: Secondary | ICD-10-CM

## 2011-08-11 DIAGNOSIS — E1149 Type 2 diabetes mellitus with other diabetic neurological complication: Secondary | ICD-10-CM

## 2011-08-11 LAB — BASIC METABOLIC PANEL
CO2: 29 mEq/L (ref 19–32)
Calcium: 9.9 mg/dL (ref 8.4–10.5)
Creatinine, Ser: 0.9 mg/dL (ref 0.4–1.2)
Sodium: 140 mEq/L (ref 135–145)

## 2011-08-11 LAB — CBC WITH DIFFERENTIAL/PLATELET
Basophils Relative: 0.2 % (ref 0.0–3.0)
Eosinophils Relative: 2.3 % (ref 0.0–5.0)
Hemoglobin: 14.5 g/dL (ref 12.0–15.0)
Lymphocytes Relative: 27.9 % (ref 12.0–46.0)
MCHC: 33.2 g/dL (ref 30.0–36.0)
Monocytes Relative: 7.7 % (ref 3.0–12.0)
Neutro Abs: 6.6 10*3/uL (ref 1.4–7.7)
Neutrophils Relative %: 61.9 % (ref 43.0–77.0)
RBC: 4.63 Mil/uL (ref 3.87–5.11)
WBC: 10.7 10*3/uL — ABNORMAL HIGH (ref 4.5–10.5)

## 2011-08-11 LAB — LIPID PANEL
Total CHOL/HDL Ratio: 4
Triglycerides: 432 mg/dL — ABNORMAL HIGH (ref 0.0–149.0)
VLDL: 86.4 mg/dL — ABNORMAL HIGH (ref 0.0–40.0)

## 2011-08-11 LAB — HEPATIC FUNCTION PANEL
ALT: 18 U/L (ref 0–35)
AST: 21 U/L (ref 0–37)
Alkaline Phosphatase: 80 U/L (ref 39–117)
Bilirubin, Direct: 0 mg/dL (ref 0.0–0.3)
Total Bilirubin: 0.5 mg/dL (ref 0.3–1.2)
Total Protein: 7.6 g/dL (ref 6.0–8.3)

## 2011-08-11 LAB — MICROALBUMIN / CREATININE URINE RATIO: Creatinine,U: 173.9 mg/dL

## 2011-08-11 LAB — LDL CHOLESTEROL, DIRECT: Direct LDL: 84.2 mg/dL

## 2011-08-11 NOTE — Assessment & Plan Note (Signed)
Better since on increased cymbalta Will continue

## 2011-08-11 NOTE — Assessment & Plan Note (Signed)
No side effects with med Due for labs

## 2011-08-11 NOTE — Assessment & Plan Note (Signed)
Seems to have good control Will check labs

## 2011-08-11 NOTE — Progress Notes (Signed)
Subjective:    Patient ID: Jaclyn Peterson, female    DOB: [birth year], 71 y.o.   MRN: [id number]  HPI Here for wellness visit No depression or anhedonia 1 fall 2 weeks ago--tripped over dog. Mild bruising in leg arm and shoulder Advanced directives reviewed  Mood is better Happy with higher dose of cymbalta Still on fluoxetine  Sees Dr Vernie Ammons still Mammogram this week Discussed no need for Paps  Checks sugars 1-2 per day. Discussed just once a day Usually under 100 fasting. Up to 150 after eating  No chest pain No edema No SOB Bought treadmill and is starting to walk---but very slowly  Current Outpatient Prescriptions on File Prior to Visit  Medication Sig Dispense Refill  . amLODipine (NORVASC) 10 MG tablet TAKE 1 TABLET DAILY  90 tablet  2  . aspirin 81 MG tablet Take 81 mg by mouth daily.        . Calcium Carb-Cholecalciferol (OS-CAL 500 + D) 500-600 MG-UNIT TABS Take by mouth daily.        . cholestyramine (QUESTRAN) 4 G packet 1/2 - 1 PACK MIXED WITH WATER DAILY TO REDUCE DIARRHEA  30 packet  3  . diazepam (VALIUM) 2 MG tablet Take 1 tablet (2 mg total) by mouth 2 (two) times daily as needed.  180 tablet  0  . DULoxetine (CYMBALTA) 60 MG capsule Take 1 capsule (60 mg total) by mouth 2 (two) times daily.  120 capsule  2  . Estropipate (OGEN 0.625 PO) Take 1 tablet by mouth daily.        Marland Kitchen FLUoxetine (PROZAC) 40 MG capsule TAKE 1 CAPSULE DAILY  90 capsule  2  . metFORMIN (GLUCOPHAGE) 500 MG tablet TAKE 1 TABLET TWICE A DAY  180 tablet  2  . methocarbamol (ROBAXIN) 500 MG tablet TAKE 1 TABLET (500 MG TOTAL) BY MOUTH 3 (THREE) TIMES DAILY.  90 tablet  2  . ONE TOUCH ULTRA TEST test strip TEST DAILY OR AS DIRECTED  100 each  3  . propranolol (INDERAL) 20 MG tablet Take 20 mg by mouth 2 (two) times daily.        Marland Kitchen senna-docusate (SENOKOT-S) 8.6-50 MG per tablet Take 1 tablet by mouth 2 (two) times daily.        . simvastatin (ZOCOR) 20 MG tablet TAKE 1 TABLET DAILY FOR  HIGH BLOOD PRESSURE  90 tablet  2  . traMADol (ULTRAM) 50 MG tablet TAKE 1 TABLET (50 MG TOTAL) BY MOUTH 2 (TWO) TIMES DAILY AS NEEDED. MAX DOSE IS 4 PER DAY  60 tablet  0    Allergies  Allergen Reactions  . Bisoprolol Fumarate     REACTION: ha  . Chlorthalidone   . Lisinopril     REACTION: throat symptoms  . Tetracyclines & Related Swelling    Throat swelling    Past Medical History  Diagnosis Date  . Depression   . Diabetes mellitus   . Hyperlipidemia   . Hypertension   . Osteoporosis   . 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 Chronic pain   . SBO (small bowel obstruction)     obstipation  10/2005  . Falls frequently     Falls/pneumonia 1/12    Past Surgical History  Procedure Date  . Appendectomy     1946  Adhesions shortly after  . Abdominal hysterectomy     BSO 1985  cancer  . Kidney stone surgery     cysto-- [place] 2003  .  Bladder repair     bladder tacking- chronic pain since  1998  . Back surgery 1970's  . Cholecystectomy     lysis of adhesions--post op complications, trach, etc  8/10    Family History  Problem Relation Age of Onset  . Coronary artery disease Father   . Hypertension Neg Hx   . Diabetes Neg Hx     History   Social History  . Marital Status: Married    Spouse Name: N/A    Number of Children: 2  . Years of Education: N/A   Occupational History  . 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 Smoking status: Current Some Day Smoker  . Smokeless tobacco: Never Used   Comment: only once in a while. Gave info on 1-800-QUIT NOW  . Alcohol Use: No  . Drug Use: No  . Sexually Active: Not on file   Other Topics Concern  . Not on file   Social History Narrative   Enjoys craftsNo living willDaughter, SIL, kids living with her knowDaughter, [name], should make health care decisions if POA neededWould accept resuscitation attempts but no prolonged artificial ventilationNo feeding tubes if cognitively  unaware   Review of Systems Sleeps okay Some constipation with cholestyramine---has to cut dose (this is for diarrhea) Appetite is fine Weight fairly stable over past year Occ gets sense of something sticking in throat--where trach was---not that often and can cough things up    Objective:   Physical Exam  Constitutional: She is oriented to person, place, and time. She appears well-developed and well-nourished. No distress.  Neck: Normal range of motion. Neck supple. No thyromegaly present.  Cardiovascular: Normal rate, regular rhythm, normal heart sounds and intact distal pulses.  Exam reveals no gallop.   No murmur heard. Pulmonary/Chest: Effort normal and breath sounds normal. No respiratory distress. She has no wheezes. She has no rales.  Abdominal: Soft. There is no tenderness.  Musculoskeletal: She exhibits no edema and no tenderness.  Lymphadenopathy:    She has no cervical adenopathy.  Neurological: She is alert and oriented to person, place, and time.       President --"Ramonita Lab, [place]" [phone] [id number] Recall 3/3  Skin: No rash noted.       Some spiders on chest  Psychiatric: She has a normal mood and affect. Her behavior is normal.          Assessment & Plan:

## 2011-08-11 NOTE — Assessment & Plan Note (Signed)
Better on increased cymbalta

## 2011-08-11 NOTE — Patient Instructions (Signed)
Please try to reduce the dose of the ogen (estropipate). Take only 6 days per week and reduce by another day per week each month. Just stop reducing doses if you have any symptoms of estrogen lack

## 2011-08-11 NOTE — Assessment & Plan Note (Signed)
BP Readings from Last 3 Encounters:  08/11/11 142/70  06/29/11 138/72  03/18/11 152/62   Generally good No changes for now

## 2011-08-11 NOTE — Assessment & Plan Note (Signed)
I have personally reviewed the Medicare Annual Wellness questionnaire and have noted 1. The patient's medical and social history 2. Their use of alcohol, tobacco or illicit drugs 3. Their current medications and supplements 4. The patient's functional ability including ADL's, fall risks, home safety risks and hearing or visual             impairment. 5. Diet and physical activities 6. Evidence for depression or mood disorders  The patients weight, height, BMI and visual acuity have been recorded in the chart I have made referrals, counseling and provided education to the patient based review of the above and I have provided the pt with a written personalized care plan for preventive services.  I have provided you with a copy of your personalized plan for preventive services. Please take the time to review along with your updated medication list.  Due for tetanus vaccine Will try to wean estrogen

## 2011-08-12 ENCOUNTER — Encounter: Payer: Self-pay | Admitting: *Deleted

## 2011-08-25 ENCOUNTER — Other Ambulatory Visit: Payer: Self-pay | Admitting: Internal Medicine

## 2011-08-25 MED ORDER — DIAZEPAM 2 MG PO TABS: 2.0000 mg | ORAL_TABLET | Freq: Two times a day (BID) | ORAL | Status: DC | PRN

## 2011-08-25 NOTE — Telephone Encounter (Signed)
Okay diazepam #180 x 0 Tramadol #60 x 0

## 2011-08-25 NOTE — Telephone Encounter (Signed)
Faxed refill request for diazepam, from [facility] [address]

## 2011-08-25 NOTE — Telephone Encounter (Signed)
rx sent to pharmacy by e-script rx called into pharmacy

## 2011-08-26 ENCOUNTER — Other Ambulatory Visit: Payer: Self-pay | Admitting: Internal Medicine

## 2011-09-26 ENCOUNTER — Other Ambulatory Visit: Payer: Self-pay | Admitting: *Deleted

## 2011-09-27 ENCOUNTER — Other Ambulatory Visit: Payer: Self-pay | Admitting: *Deleted

## 2011-09-27 MED ORDER — METHOCARBAMOL 500 MG PO TABS: 500.0000 mg | ORAL_TABLET | Freq: Three times a day (TID) | ORAL | Status: DC

## 2011-09-27 MED ORDER — TRAMADOL HCL 50 MG PO TABS: 50.0000 mg | ORAL_TABLET | Freq: Two times a day (BID) | ORAL | Status: DC | PRN

## 2011-09-27 NOTE — Telephone Encounter (Signed)
rx sent to pharmacy by e-script  

## 2011-09-27 NOTE — Telephone Encounter (Signed)
Okay #90 x 2

## 2011-09-27 NOTE — Telephone Encounter (Signed)
Okay #60 x 0 

## 2011-10-27 ENCOUNTER — Other Ambulatory Visit: Payer: Self-pay | Admitting: Internal Medicine

## 2011-10-27 NOTE — Telephone Encounter (Signed)
rx sent to pharmacy by e-script  

## 2011-10-27 NOTE — Telephone Encounter (Signed)
Okay #60 x 0 

## 2011-11-25 ENCOUNTER — Other Ambulatory Visit: Payer: Self-pay | Admitting: Internal Medicine

## 2011-11-25 MED ORDER — DIAZEPAM 2 MG PO TABS: 2.0000 mg | ORAL_TABLET | Freq: Two times a day (BID) | ORAL | Status: DC | PRN

## 2011-11-25 NOTE — Telephone Encounter (Signed)
Medication phoned to pharmacy.  

## 2011-11-25 NOTE — Telephone Encounter (Deleted)
Electronic refill request

## 2011-11-25 NOTE — Telephone Encounter (Signed)
Okay diazepam #180 x 0 Tramadol #60 x 0

## 2011-12-23 ENCOUNTER — Other Ambulatory Visit: Payer: Self-pay | Admitting: Internal Medicine

## 2011-12-23 ENCOUNTER — Other Ambulatory Visit: Payer: Self-pay | Admitting: *Deleted

## 2011-12-23 NOTE — Telephone Encounter (Signed)
Change it to tid prn #90 x 1

## 2011-12-25 NOTE — Telephone Encounter (Signed)
Okay #60 x 0 

## 2011-12-26 ENCOUNTER — Other Ambulatory Visit: Payer: Self-pay

## 2011-12-26 MED ORDER — TRAMADOL HCL 50 MG PO TABS: 50.0000 mg | ORAL_TABLET | Freq: Two times a day (BID) | ORAL | Status: DC | PRN

## 2011-12-26 NOTE — Telephone Encounter (Signed)
Pt request status of Tramadol refill; spoke with Melanie at [facility]. Rx ready for pick up.pt notified while on phone.

## 2011-12-26 NOTE — Telephone Encounter (Signed)
rx sent to pharmacy by e-script  

## 2012-01-17 ENCOUNTER — Other Ambulatory Visit: Payer: Self-pay | Admitting: *Deleted

## 2012-01-17 MED ORDER — DULOXETINE HCL 60 MG PO CPEP: 60.0000 mg | ORAL_CAPSULE | Freq: Two times a day (BID) | ORAL | Status: DC

## 2012-01-17 MED ORDER — CHOLESTYRAMINE 4 G PO PACK: 1.0000 | PACK | Freq: Every day | ORAL | Status: DC

## 2012-01-23 ENCOUNTER — Other Ambulatory Visit: Payer: Self-pay | Admitting: Internal Medicine

## 2012-01-30 ENCOUNTER — Other Ambulatory Visit: Payer: Self-pay | Admitting: Internal Medicine

## 2012-02-13 ENCOUNTER — Ambulatory Visit: Payer: Medicare Other | Admitting: Internal Medicine

## 2012-02-22 ENCOUNTER — Other Ambulatory Visit: Payer: Self-pay | Admitting: Family Medicine

## 2012-02-22 ENCOUNTER — Telehealth: Payer: Self-pay

## 2012-02-22 ENCOUNTER — Other Ambulatory Visit: Payer: Self-pay | Admitting: Internal Medicine

## 2012-02-22 NOTE — Telephone Encounter (Signed)
Pt said [facility] [address] said Dr Silvio Pate denied Diazepam. Spoke with Cherlynn Kaiser at [facility] and he did receive refill for Diazepam and will be ready for pick up in 15-20 mins. Pt advised while on phone.

## 2012-02-22 NOTE — Telephone Encounter (Signed)
#  180 x 0

## 2012-02-22 NOTE — Telephone Encounter (Signed)
rx called into pharmacy

## 2012-02-28 ENCOUNTER — Ambulatory Visit (INDEPENDENT_AMBULATORY_CARE_PROVIDER_SITE_OTHER): Payer: Medicare Other | Admitting: Internal Medicine

## 2012-02-28 ENCOUNTER — Encounter: Payer: Self-pay | Admitting: Internal Medicine

## 2012-02-28 VITALS — BP 130/60 | HR 54 | Temp 98.3°F | Wt 170.0 lb

## 2012-02-28 DIAGNOSIS — E1142 Type 2 diabetes mellitus with diabetic polyneuropathy: Secondary | ICD-10-CM

## 2012-02-28 DIAGNOSIS — F329 Major depressive disorder, single episode, unspecified: Secondary | ICD-10-CM

## 2012-02-28 DIAGNOSIS — I10 Essential (primary) hypertension: Secondary | ICD-10-CM

## 2012-02-28 DIAGNOSIS — G8929 Other chronic pain: Secondary | ICD-10-CM

## 2012-02-28 DIAGNOSIS — E1140 Type 2 diabetes mellitus with diabetic neuropathy, unspecified: Secondary | ICD-10-CM

## 2012-02-28 DIAGNOSIS — F3289 Other specified depressive episodes: Secondary | ICD-10-CM

## 2012-02-28 DIAGNOSIS — E1149 Type 2 diabetes mellitus with other diabetic neurological complication: Secondary | ICD-10-CM

## 2012-02-28 DIAGNOSIS — Z1331 Encounter for screening for depression: Secondary | ICD-10-CM

## 2012-02-28 LAB — HEMOGLOBIN A1C: Hgb A1c MFr Bld: 6.9 % — ABNORMAL HIGH (ref 4.6–6.5)

## 2012-02-28 NOTE — Progress Notes (Signed)
Subjective:    Patient ID: Jaclyn Peterson Section, female    DOB: January 29, 1941, 71 y.o.   MRN: [id number]  HPI Doing well Still has soreness in left shoulder---she fell on it twice Especially bad at night Uses the ultram  Uses the diazepam if she has back spasm Occ uses for anxiety Still feels her depression is well controlled No depression or anhedonia on her current meds The cymbalta helps the neuropathy also Discussed using cane for stability due to the neuropathy  Checks sugars once a day usually. Discussed 2-3 times per week Runs 109-200. Average about 120-125 No hypoglycemic reactions unless she misses meal---gets a little shaky  No chest pain No SOB Does treadmill regularly  Current Outpatient Prescriptions on File Prior to Visit  Medication Sig Dispense Refill  . alendronate (FOSAMAX) 70 MG tablet Take 70 mg by mouth every 7 (seven) days.       Marland Kitchen amLODipine (NORVASC) 10 MG tablet TAKE 1 TABLET DAILY  90 tablet  2  . aspirin 81 MG tablet Take 81 mg by mouth daily.        . Calcium Carb-Cholecalciferol (OS-CAL 500 + D) 500-600 MG-UNIT TABS Take by mouth daily.        . cholestyramine (QUESTRAN) 4 G packet Take 1 packet by mouth daily.  60 packet  3  . diazepam (VALIUM) 2 MG tablet TAKE 1 TABLET TWICE A DAY AS NEEDED  180 tablet  0  . DULoxetine (CYMBALTA) 60 MG capsule Take 1 capsule (60 mg total) by mouth 2 (two) times daily.  120 capsule  2  . Estropipate (OGEN 0.625 PO) Take 1 tablet by mouth daily.        Marland Kitchen FLUoxetine (PROZAC) 40 MG capsule TAKE 1 CAPSULE DAILY  90 capsule  2  . metFORMIN (GLUCOPHAGE) 500 MG tablet TAKE 1 TABLET TWICE A DAY  180 tablet  0  . methocarbamol (ROBAXIN) 500 MG tablet TAKE 1 TABLET (500 MG TOTAL) BY MOUTH 3 (THREE) TIMES DAILY.  90 tablet  1  . ONE TOUCH ULTRA TEST test strip TEST DAILY OR AS DIRECTED  100 each  3  . propranolol (INDERAL) 20 MG tablet Take 20 mg by mouth 2 (two) times daily.        Marland Kitchen senna-docusate (SENOKOT-S) 8.6-50 MG per  tablet Take 1 tablet by mouth 2 (two) times daily.        . simvastatin (ZOCOR) 20 MG tablet TAKE 1 TABLET DAILY FOR HIGH BLOOD PRESSURE  90 tablet  2  . traMADol (ULTRAM) 50 MG tablet TAKE 1 TABLET BY MOUTH TWICE A DAY AS NEEDED FOR PAIN  60 tablet  0    Allergies  Allergen Reactions  . Bisoprolol Fumarate     REACTION: ha  . Chlorthalidone   . Lisinopril     REACTION: throat symptoms  . Tetracyclines & Related Swelling    Throat swelling    Past Medical History  Diagnosis Date  . Depression   . Diabetes mellitus   . Hyperlipidemia   . Hypertension   . Osteoporosis   . 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 Chronic pain   . SBO (small bowel obstruction)     obstipation  10/2005  . Falls frequently     Falls/pneumonia 1/12    Past Surgical History  Procedure Date  . Appendectomy     1946  Adhesions shortly after  . Abdominal hysterectomy     BSO 1985  cancer  .  Kidney stone surgery     cysto-- [place] 2003  . Bladder repair     bladder tacking- chronic pain since  1998  . Back surgery 1970's  . Cholecystectomy     lysis of adhesions--post op complications, trach, etc  8/10    Family History  Problem Relation Age of Onset  . Coronary artery disease Father   . Hypertension Neg Hx   . Diabetes Neg Hx     History   Social History  . Marital Status: Married    Spouse Name: N/A    Number of Children: 2  . Years of Education: N/A   Occupational History  . 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 Smoking status: Current Some Day Smoker  . Smokeless tobacco: Never Used     Comment: only once in a while. Gave info on 1-800-QUIT NOW  . Alcohol Use: No  . Drug Use: No  . Sexually Active: Not on file   Other Topics Concern  . Not on file   Social History Narrative   Enjoys craftsNo living willDaughter, SIL, kids living with her knowDaughter, [name], should make health care decisions if POA neededWould accept resuscitation  attempts but no prolonged artificial ventilationNo feeding tubes if cognitively unaware   'Review of Systems Sleeps okay with her meds Appetite is good Weight is up 3#    Objective:   Physical Exam  Constitutional: She appears well-developed and well-nourished. No distress.  Neck: Normal range of motion. Neck supple. No thyromegaly present.  Cardiovascular: Normal rate, regular rhythm, normal heart sounds and intact distal pulses.  Exam reveals no gallop.   No murmur heard. Pulmonary/Chest: Effort normal and breath sounds normal. No respiratory distress. She has no wheezes. She has no rales.  Musculoskeletal: She exhibits no edema.       Decreased abduction---to shoulder height-- and limited external rotation (offerred cortisone but she will defer)  Lymphadenopathy:    She has no cervical adenopathy.  Psychiatric: She has a normal mood and affect. Her behavior is normal.          Assessment & Plan:

## 2012-02-28 NOTE — Assessment & Plan Note (Signed)
BP Readings from Last 3 Encounters:  02/28/12 130/60  08/11/11 142/70  06/29/11 138/72   Good control

## 2012-02-28 NOTE — Assessment & Plan Note (Signed)
Seems to have good control Will check a1c

## 2012-02-28 NOTE — Assessment & Plan Note (Signed)
Does okay with the tramadol

## 2012-02-28 NOTE — Assessment & Plan Note (Signed)
Still satisfied with her dual regimen cymbalta helps the diabetic neuropathy also

## 2012-02-28 NOTE — Assessment & Plan Note (Signed)
Probably responsible for her falls Discussed cane and putting hand on something for balance when she turns her head

## 2012-02-29 ENCOUNTER — Encounter: Payer: Self-pay | Admitting: *Deleted

## 2012-03-14 ENCOUNTER — Other Ambulatory Visit: Payer: Self-pay | Admitting: Internal Medicine

## 2012-03-15 ENCOUNTER — Other Ambulatory Visit: Payer: Self-pay | Admitting: Internal Medicine

## 2012-03-16 ENCOUNTER — Other Ambulatory Visit: Payer: Self-pay | Admitting: Internal Medicine

## 2012-03-19 ENCOUNTER — Ambulatory Visit (INDEPENDENT_AMBULATORY_CARE_PROVIDER_SITE_OTHER): Payer: Medicare PPO | Admitting: Family Medicine

## 2012-03-19 ENCOUNTER — Encounter: Payer: Self-pay | Admitting: Family Medicine

## 2012-03-19 VITALS — BP 168/70 | HR 61 | Temp 99.1°F | Wt 174.0 lb

## 2012-03-19 DIAGNOSIS — R05 Cough: Secondary | ICD-10-CM

## 2012-03-19 MED ORDER — BENZONATATE 200 MG PO CAPS: 200.0000 mg | ORAL_CAPSULE | Freq: Three times a day (TID) | ORAL | Status: AC | PRN

## 2012-03-19 NOTE — Progress Notes (Signed)
Cough.  Sx started about 3-4 days ago. Now with some sputum.  Fatigued.  No fever.  Taking nyquil.  No ear pain.  No rash, no ST.  Ribs are sore from coughing as of today.    Meds, vitals, and allergies reviewed.   ROS: See HPI.  Otherwise, noncontributory.  nad ncat Tm wnl Nasal exam slightly stuffy OP wnl except for mild cobblestoning Neck supple with UAN noted, no LA rrr ctab except for UAN, no focal dec in BS, no inc in wob.  Ext w/o cyanosis

## 2012-03-19 NOTE — Patient Instructions (Signed)
Take the tessalon for cough and this should gradually improve.  Take care.  Get some rest and drink plenty of fluids.

## 2012-03-20 DIAGNOSIS — R05 Cough: Secondary | ICD-10-CM | POA: Insufficient documentation

## 2012-03-20 NOTE — Assessment & Plan Note (Signed)
Likely viral, nontoxic, supportive tx with tessalon prn cough.  Fu prn.  She agrees.

## 2012-03-22 ENCOUNTER — Other Ambulatory Visit: Payer: Self-pay | Admitting: Internal Medicine

## 2012-04-13 ENCOUNTER — Encounter: Payer: Self-pay | Admitting: Internal Medicine

## 2012-04-13 ENCOUNTER — Ambulatory Visit (INDEPENDENT_AMBULATORY_CARE_PROVIDER_SITE_OTHER): Payer: Medicare PPO | Admitting: Internal Medicine

## 2012-04-13 VITALS — BP 150/70 | HR 58 | Temp 98.6°F | Resp 16 | Wt 168.0 lb

## 2012-04-13 DIAGNOSIS — R05 Cough: Secondary | ICD-10-CM

## 2012-04-13 MED ORDER — TRAMADOL HCL 50 MG PO TABS: 50.0000 mg | ORAL_TABLET | Freq: Two times a day (BID) | ORAL | Status: DC | PRN

## 2012-04-13 NOTE — Assessment & Plan Note (Signed)
Persists Seems to be paroxysmal Discussed that it could be pertussis or other atypical infection Probably too late for antibiotic to help even if pertussis---will withhold unless she gets more productive cough Tessalon, OTC for cough Discussed honey at bedtime Also using tramadol--will refill

## 2012-04-13 NOTE — Patient Instructions (Signed)
Please call next week if your cough worsens or becomes more productive

## 2012-04-13 NOTE — Progress Notes (Signed)
Subjective:    Patient ID: Doristine Section, female    DOB: Dec 16, 1940, 72 y.o.   MRN: [id number]  HPI Never really got over the cough since the last visit Rattling in chest Now mostly coughing at night Cough is paroxysmal Has beige sputum---feels like it is getting stuck in her throat  Some intermittent low grade fever No SOB  Using the tessalon--may help slightly Using nyquil cough--helps her go back to sleep  Current Outpatient Prescriptions on File Prior to Visit  Medication Sig Dispense Refill  . alendronate (FOSAMAX) 70 MG tablet Take 70 mg by mouth every 7 (seven) days.       Marland Kitchen amLODipine (NORVASC) 10 MG tablet TAKE 1 TABLET DAILY  90 tablet  1  . aspirin 81 MG tablet Take 81 mg by mouth daily.        . Calcium Carb-Cholecalciferol (OS-CAL 500 + D) 500-600 MG-UNIT TABS Take by mouth daily.        . cholestyramine (QUESTRAN) 4 G packet Take 1 packet by mouth daily.  60 packet  3  . diazepam (VALIUM) 2 MG tablet TAKE 1 TABLET TWICE A DAY AS NEEDED  180 tablet  0  . DULoxetine (CYMBALTA) 60 MG capsule Take 1 capsule (60 mg total) by mouth 2 (two) times daily.  120 capsule  2  . Estropipate (OGEN 0.625 PO) Take 1 tablet by mouth daily.        Marland Kitchen FLUoxetine (PROZAC) 40 MG capsule TAKE 1 CAPSULE DAILY  90 capsule  1  . metFORMIN (GLUCOPHAGE) 500 MG tablet TAKE 1 TABLET TWICE A DAY  180 tablet  0  . methocarbamol (ROBAXIN) 500 MG tablet TAKE 1 TABLET (500 MG TOTAL) BY MOUTH 3 (THREE) TIMES DAILY.  90 tablet  1  . ONE TOUCH ULTRA TEST test strip TEST DAILY OR AS DIRECTED  100 each  3  . propranolol (INDERAL) 40 MG tablet TAKE ONE-HALF (1/2) TABLET TWICE A DAY  90 tablet  1  . senna-docusate (SENOKOT-S) 8.6-50 MG per tablet Take 1 tablet by mouth 2 (two) times daily.        . simvastatin (ZOCOR) 20 MG tablet TAKE 1 TABLET DAILY FOR HIGH BLOOD PRESSURE  90 tablet  1  . traMADol (ULTRAM) 50 MG tablet TAKE 1 TABLET BY MOUTH TWICE A DAY AS NEEDED FOR PAIN  60 tablet  0    Allergies   Allergen Reactions  . Bisoprolol Fumarate     REACTION: ha  . Chlorthalidone   . Lisinopril     REACTION: throat symptoms  . Tetracyclines & Related Swelling    Throat swelling    Past Medical History  Diagnosis Date  . Depression   . Diabetes mellitus   . Hyperlipidemia   . Hypertension   . Osteoporosis   . 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 Chronic pain   . SBO (small bowel obstruction)     obstipation  10/2005  . Falls frequently     Falls/pneumonia 1/12    Past Surgical History  Procedure Date  . Appendectomy     1946  Adhesions shortly after  . Abdominal hysterectomy     BSO 1985  cancer  . Kidney stone surgery     cysto-- [place] 2003  . Bladder repair     bladder tacking- chronic pain since  1998  . Back surgery 1970's  . Cholecystectomy     lysis of adhesions--post op complications, trach, etc  8/10  .  Cataract extraction 12/12    left and then laser---Dr Tobe Sos    Family History  Problem Relation Age of Onset  . Coronary artery disease Father   . Hypertension Neg Hx   . Diabetes Neg Hx     History   Social History  . Marital Status: Married    Spouse Name: N/A    Number of Children: 2  . Years of Education: N/A   Occupational History  . 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 Smoking status: Current Some Day Smoker  . Smokeless tobacco: Never Used     Comment: only once in a while. Gave info on 1-800-QUIT NOW  . Alcohol Use: No  . Drug Use: No  . Sexually Active: Not on file   Other Topics Concern  . Not on file   Social History Narrative   Enjoys craftsNo living willDaughter, SIL, kids living with her knowDaughter, [name], should make health care decisions if POA neededWould accept resuscitation attempts but no prolonged artificial ventilationNo feeding tubes if cognitively unaware   Review of Systems Occasionally gets food stuck---feels like it doesn't pass her past trach spot No falls since  increasing the cymbalta    Objective:   Physical Exam  Constitutional: She appears well-developed and well-nourished. No distress.  HENT:  Mouth/Throat: Oropharynx is clear and moist. No oropharyngeal exudate.       No sinus tenderness Mild nasal inflammation TMs normal  Neck: Normal range of motion. Neck supple. No thyromegaly present.  Pulmonary/Chest: Effort normal and breath sounds normal. No respiratory distress. She has no wheezes. She has no rales.  Lymphadenopathy:    She has no cervical adenopathy.          Assessment & Plan:

## 2012-04-20 ENCOUNTER — Other Ambulatory Visit: Payer: Self-pay | Admitting: Internal Medicine

## 2012-04-25 ENCOUNTER — Telehealth: Payer: Self-pay

## 2012-04-25 NOTE — Telephone Encounter (Signed)
Pt said insurance no longer covers [facility] and pt request Fluoxetine and Propranolol prescriptions sent to [place] pt to contact [place] and they can transfer prescriptions to their pharmacy. [facility]

## 2012-04-30 ENCOUNTER — Telehealth: Payer: Self-pay | Admitting: Family Medicine

## 2012-04-30 MED ORDER — FLUOXETINE HCL 40 MG PO CAPS: 40.0000 mg | ORAL_CAPSULE | Freq: Every day | ORAL | Status: DC

## 2012-04-30 MED ORDER — PROPRANOLOL HCL 40 MG PO TABS: 40.0000 mg | ORAL_TABLET | Freq: Two times a day (BID) | ORAL | Status: DC

## 2012-04-30 NOTE — Telephone Encounter (Signed)
Spoke with patient this morning and she [facility], spoke with pharmacist and she made a mistake patient did get refills.

## 2012-04-30 NOTE — Telephone Encounter (Signed)
[facility] Triage Call Report Triage Record Num: [id number] Operator: Winona Legato Patient Name: Jaclyn Peterson Call Date & Time: 04/27/2012 5:38:33PM Patient Phone: [phone] PCP: Viviana Simpler Patient Gender: Female PCP Fax : [phone] Patient DOB: 01-02-1941 Practice Name: [facility] Reason for Call: Caller: [name]/Patient; PCP: Viviana Simpler (Family Practice); CB#: [phone]; Call regarding Medication Issue; Medication(s): Propanol and Fluoxetine ; Pt calling for Propanolol and Fluoxetine Rx. [facility] not transfer- do not have a valid Rx. Verifed Rx in Epic (03/16/12). [facility] both in per [phone], [place]. Relayed to pt. Protocol(s) Used: Medication Questions - Adult Recommended Outcome per Protocol: Provided Health Information Reason for Outcome: Caller has medication question(s) that was answered with available resources Care Advice: ~ 04/27/2012 5:52:49PM Page 1 of 1 [email or web address]

## 2012-04-30 NOTE — Telephone Encounter (Signed)
Spoke with patient and she no longer uses [facility] because of insurance, both rx's was called in by [facility].

## 2012-04-30 NOTE — Telephone Encounter (Signed)
[facility] [address] [facility] requesting refill fluoxetine and propranolol. ? If call a nurse called in refill.Please advise.

## 2012-04-30 NOTE — Telephone Encounter (Signed)
Please make sure she is set with her scripts

## 2012-05-04 ENCOUNTER — Encounter: Payer: Self-pay | Admitting: Internal Medicine

## 2012-05-04 ENCOUNTER — Telehealth: Payer: Self-pay

## 2012-05-04 ENCOUNTER — Ambulatory Visit (INDEPENDENT_AMBULATORY_CARE_PROVIDER_SITE_OTHER): Payer: Medicare PPO | Admitting: Internal Medicine

## 2012-05-04 VITALS — BP 158/70 | HR 60 | Temp 98.9°F | Wt 171.0 lb

## 2012-05-04 DIAGNOSIS — T07XXXA Unspecified multiple injuries, initial encounter: Secondary | ICD-10-CM | POA: Insufficient documentation

## 2012-05-04 NOTE — Progress Notes (Signed)
Subjective:    Patient ID: Jaclyn Peterson Section, female    DOB: [birth year], 72 y.o.   MRN: [id number]  HPI Slipped off the landing on porch-- ~2PM today [place] backwards into brick wall hitting upper back and then onto buttocks on porch Describes pain as 9/10--mostly with deep breath or reaching with left > right arm No LOC--needed help getting up. Was "out of sorts"-- trouble getting thoughts together but speech was fine  Tried aleve at 2:30PM--no help No weakness Some tingling in back No hip pain  Current Outpatient Prescriptions on File Prior to Visit  Medication Sig Dispense Refill  . alendronate (FOSAMAX) 70 MG tablet Take 70 mg by mouth every 7 (seven) days.       Marland Kitchen amLODipine (NORVASC) 10 MG tablet TAKE 1 TABLET DAILY  90 tablet  1  . aspirin 81 MG tablet Take 81 mg by mouth daily.        . benzonatate (TESSALON) 200 MG capsule Take 200 mg by mouth 3 (three) times daily as needed.       . Calcium Carb-Cholecalciferol (OS-CAL 500 + D) 500-600 MG-UNIT TABS Take by mouth daily.        . cholestyramine (QUESTRAN) 4 G packet Take 1 packet by mouth daily.  60 packet  3  . diazepam (VALIUM) 2 MG tablet TAKE 1 TABLET TWICE A DAY AS NEEDED  180 tablet  0  . DULoxetine (CYMBALTA) 60 MG capsule Take 1 capsule (60 mg total) by mouth 2 (two) times daily.  120 capsule  2  . Estropipate (OGEN 0.625 PO) Take 1 tablet by mouth daily.        Marland Kitchen FLUoxetine (PROZAC) 40 MG capsule Take 1 capsule (40 mg total) by mouth daily.  90 capsule  1  . metFORMIN (GLUCOPHAGE) 500 MG tablet TAKE 1 TABLET TWICE A DAY  180 tablet  0  . methocarbamol (ROBAXIN) 500 MG tablet TAKE 1 TABLET BY MOUTH 3 TIMES A DAY  90 tablet  1  . ONE TOUCH ULTRA TEST test strip TEST DAILY OR AS DIRECTED  100 each  3  . propranolol (INDERAL) 40 MG tablet Take 1 tablet (40 mg total) by mouth 2 (two) times daily.  90 tablet  1  . senna-docusate (SENOKOT-S) 8.6-50 MG per tablet Take 1 tablet by mouth 2 (two) times daily.        .  simvastatin (ZOCOR) 20 MG tablet TAKE 1 TABLET DAILY FOR HIGH BLOOD PRESSURE  90 tablet  1  . traMADol (ULTRAM) 50 MG tablet Take 1 tablet (50 mg total) by mouth 2 (two) times daily as needed for pain. For pain  60 tablet  0   No current facility-administered medications on file prior to visit.    Allergies  Allergen Reactions  . Bisoprolol Fumarate     REACTION: ha  . Chlorthalidone   . Lisinopril     REACTION: throat symptoms  . Tetracyclines & Related Swelling    Throat swelling    Past Medical History  Diagnosis Date  . Depression   . Diabetes mellitus   . Hyperlipidemia   . Hypertension   . Osteoporosis   . 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 Chronic pain   . SBO (small bowel obstruction)     obstipation  10/2005  . Falls frequently     Falls/pneumonia 1/12    Past Surgical History  Procedure Laterality Date  . Appendectomy      1946  Adhesions shortly after  . Abdominal hysterectomy      BSO 1985  cancer  . Kidney stone surgery      cysto-- [facility] 2003  . Bladder repair      bladder tacking- chronic pain since  1998  . Back surgery  1970's  . Cholecystectomy      lysis of adhesions--post op complications, trach, etc  8/10  . Cataract extraction  12/12    left and then laser---Dr Tobe Sos    Family History  Problem Relation Age of Onset  . Coronary artery disease Father   . Hypertension Neg Hx   . Diabetes Neg Hx     History   Social History  . Marital Status: Married    Spouse Name: N/A    Number of Children: 2  . Years of Education: N/A   Occupational History  . 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 Smoking status: Current Some Day Smoker  . Smokeless tobacco: Never Used     Comment: only once in a while. Gave info on 1-800-QUIT NOW  . Alcohol Use: No  . Drug Use: No  . Sexually Active: Not on file   Other Topics Concern  . Not on file   Social History Narrative   Enjoys crafts      No living  will   Daughter, SIL, kids living with her know   Daughter, [name], should make health care decisions if POA needed   Would accept resuscitation attempts but no prolonged artificial ventilation   No feeding tubes if cognitively unaware            Review of Systems No diplopia or unilateral vision loss No nausea or vomiting    Objective:   Physical Exam  Constitutional: She appears well-developed and well-nourished. No distress.  HENT:  Mouth/Throat: Oropharynx is clear and moist. No oropharyngeal exudate.  No hemotympanum  Neck: No thyromegaly present.  Pain with rotation of neck  Cardiovascular: Normal rate, regular rhythm and normal heart sounds.  Exam reveals no friction rub.   No murmur heard. Pulmonary/Chest: Effort normal and breath sounds normal. No respiratory distress. She has no wheezes. She has no rales.  No dullness  Lymphadenopathy:    She has no cervical adenopathy.  Neurological: She is alert. She has normal strength. She displays no atrophy and no tremor. No cranial nerve deficit. She exhibits normal muscle tone. She displays a negative Romberg sign. Gait abnormal. Coordination normal.  Antalgic gait No focal weakness  Psychiatric: She has a normal mood and affect. Her behavior is normal.          Assessment & Plan:

## 2012-05-04 NOTE — Assessment & Plan Note (Signed)
Fortunately appear to be all relatively mild and soft tissue Not sure if she hit head---probably not--but no worrisome neuro findings No evidence of rib fracture or thoracic injury  Discussed ice and tramadol with patient and daughter ER if change in consciousness

## 2012-05-04 NOTE — Telephone Encounter (Signed)
See OV note.  

## 2012-05-04 NOTE — Telephone Encounter (Signed)
Christine pts daughter said pt fell depth of one step off porch and hit back at bra line near shoulder blades. Pt complains it hurts when she takes breath. [name] does not think pt hit head but now pt has some memory issues; why am I in the house: why is my purse open? Pt is aware of who she is and who daughter is. No N&V, h/a or dizziness. [name] wants pt seen. Dr Silvio Pate said could see today at 4:45. Advised Christine of appt and if pt condition where to change or worsen prior to appt to call back. Christine voiced understanding.

## 2012-05-10 ENCOUNTER — Other Ambulatory Visit: Payer: Self-pay | Admitting: Internal Medicine

## 2012-05-11 ENCOUNTER — Telehealth: Payer: Self-pay | Admitting: Internal Medicine

## 2012-05-11 ENCOUNTER — Ambulatory Visit (INDEPENDENT_AMBULATORY_CARE_PROVIDER_SITE_OTHER)
Admission: RE | Admit: 2012-05-11 | Discharge: 2012-05-11 | Disposition: A | Discharge status: Home / Self Care | Payer: Medicare PPO | Source: Ambulatory Visit | Attending: Family Medicine | Admitting: Family Medicine

## 2012-05-11 ENCOUNTER — Ambulatory Visit (INDEPENDENT_AMBULATORY_CARE_PROVIDER_SITE_OTHER): Payer: Medicare PPO | Admitting: Family Medicine

## 2012-05-11 ENCOUNTER — Encounter: Payer: Self-pay | Admitting: Family Medicine

## 2012-05-11 VITALS — BP 124/80 | HR 64 | Temp 98.3°F | Wt 168.0 lb

## 2012-05-11 DIAGNOSIS — M533 Sacrococcygeal disorders, not elsewhere classified: Secondary | ICD-10-CM

## 2012-05-11 DIAGNOSIS — M545 Low back pain, unspecified: Secondary | ICD-10-CM

## 2012-05-11 MED ORDER — HYDROCODONE-ACETAMINOPHEN 5-325 MG PO TABS: 1.0000 | ORAL_TABLET | Freq: Four times a day (QID) | ORAL | Status: DC | PRN

## 2012-05-11 MED ORDER — MELOXICAM 15 MG PO TABS: 15.0000 mg | ORAL_TABLET | Freq: Every day | ORAL | Status: DC

## 2012-05-11 NOTE — Patient Instructions (Addendum)
We will call with X-ray results and recommendations.

## 2012-05-11 NOTE — Telephone Encounter (Signed)
Patient Information:  Caller Name: Locklyn  Phone: [phone]  Patient: Jaclyn Peterson  Gender: Female  DOB: 03/29/1940  Age: 72 Years  PCP: Viviana Simpler [facility])  Office Follow Up:  Does the office need to follow up with this patient?: No  Instructions For The Office: N/A   Symptoms  Reason For Call & Symptoms: Golden Circle backwards off of of landing (approx 6in) 05/04/12 onto a brick wall onto her middle upper back.  Pt sates that has healed up well.  Pt presents with a new onset of pain above her coccyx with and an area of numbness. No numbness in lower extremities.  Pain level 10/10.    Reviewed Health History In EMR: Yes  Reviewed Medications In EMR: Yes  Reviewed Allergies In EMR: Yes  Reviewed Surgeries / Procedures: Yes  Date of Onset of Symptoms: 05/07/2012  Treatments Tried: Ice  Treatments Tried Worked: No  Guideline(s) Used:  Back Injury  Disposition Per Guideline:   Go to Office Now  Reason For Disposition Reached:   Severe pain (e.g., excruciating, unable to do any normal activities)  Advice Given:  Call Back If:  You become worse.  Appointment Scheduled:  05/11/2012 12:15:00 Appointment Scheduled Provider:  Eliezer Lofts [facility])

## 2012-05-11 NOTE — Telephone Encounter (Signed)
Notify pt that low back X-ray is normal as well as sacral... She has a compression fracture,likely old that does not correspond with where she is tender.  Will have her start an antinflammatory  And I will call in a stronger pain medication to use for breakthrough pain. Have her hold aleve and tramadol/.

## 2012-05-11 NOTE — Telephone Encounter (Signed)
Patient advised and rx called to pharmcy

## 2012-05-11 NOTE — Progress Notes (Signed)
  Subjective:    Patient ID: Doristine Section, female    DOB: Dec 22, 1940, 72 y.o.   MRN: [id number]  HPI  72 year old female with history of osteoporosis and recent back injury.. Seen 1 week ago.  2/21: Slipped off the landing on porch-- ~2PM  Fell backwards into brick wall hitting upper back and then onto buttocks on porch  Describes pain as 9/10--mostly with deep breath or reaching with left > right arm  No LOC--needed help getting up. Was "out of sorts"-- trouble getting thoughts together but speech was fine  Tried aleve at 2:30PM--no help  No weakness  Some tingling in back  No hip pain    Saw Dr. Silvio Pate. Tramadol helped back pain.  Now sacrum painful and swollen. This was not severly painful at the time.  Lidocaine patches not helping.    No X-rays done last OV.  Review of Systems  Constitutional: Negative for fever and fatigue.  HENT: Negative for congestion.   Respiratory: Negative for shortness of breath.   Cardiovascular: Negative for chest pain.  Musculoskeletal: Positive for back pain and gait problem.  Neurological: Negative for weakness, light-headedness and numbness.       Objective:   Physical Exam  Constitutional: She is oriented to person, place, and time. She appears well-developed.  HENT:  Head: Normocephalic.  Eyes: Conjunctivae are normal. Pupils are equal, round, and reactive to light.  Neck: Normal range of motion. Neck supple. No thyromegaly present.  Cardiovascular: Normal rate and regular rhythm.  Exam reveals no friction rub.   No murmur heard. Pulmonary/Chest: Effort normal and breath sounds normal.  Musculoskeletal:  ttp over lower lumbar spine and sacrum, pinpoint pain over coccyx Positive faber's B  neg SLR  Neurological: She is alert and oriented to person, place, and time. She displays normal reflexes. She exhibits normal muscle tone. Coordination normal.          Assessment & Plan:

## 2012-05-16 NOTE — Assessment & Plan Note (Signed)
Will eval for fracture with X-ray given persistent worsening pain, age and osteoporosis.

## 2012-05-17 ENCOUNTER — Telehealth: Payer: Self-pay | Admitting: Internal Medicine

## 2012-05-17 NOTE — Telephone Encounter (Signed)
I am not surprised by ongoing pain  She probably got a deep bruise and that takes a while to heal If she can walk okay and sleep with the med and no change in bladder or bowel, okay to wait it out

## 2012-05-17 NOTE — Telephone Encounter (Signed)
Patient Information:  Caller Name: Chanda  Phone: [phone]  Patient: Jaclyn Peterson, Jaclyn Peterson  Gender: Female  DOB: Feb 08, 1941  Age: 72 Years  PCP: Viviana Simpler [facility])  Office Follow Up:  Does the office need to follow up with this patient?: Yes  Instructions For The Office: Patient declines offered appt. for 05/17/12. Patient states she would like Dr. Silvio Pate to review above and advise. Patient uses Data processing manager on [employer] at [phone]. Please return call to patient at [phone]. Call back parameters reviewed. Patient verbalizes understanding.  RN Note:  Patient states she slipped on the landing of her porch 05/04/12 striking her shoulder blade and back on a brick wall. Patient states she was seen by Dr. Silvio Pate 05/04/12 and was again seen by Dr. Diona Browner 05/11/12. Patient had Xrays of back on 05/11/12. Patient states she received a call that Xrays revealed an old compression fracture. Patient was prescribed Hydrocodone/Acetaminophen. Patient states pain improves with Hydrocodone but she does not feel that the pain is improving. Patient describes discomfort and swelling in the center of her back, approx. 4 inches above her buttock. States pain is constant. Denies weakness or numbness of extremities. Patient is ambulatory. Patient states she is able to sleep if she takes the Hydrocodone as prescribed. States pain medication only provides relief for approx. 4 hours, then pain recurs at a "9" on a 1-10 scale. Denies hematuria or urinary sx. Care advice given per guidelines. Patient declines offered appt. for 05/17/12. Patient states she would like Dr. Silvio Pate to review above and advise. Patient uses Data processing manager on [employer] at [phone]. Please return call to patient at [phone]. Call back parameters reviewed. Patient verbalizes understanding.  Symptoms  Reason For Call & Symptoms: Back Pain  Reviewed Health History In EMR: Yes  Reviewed  Medications In EMR: Yes  Reviewed Allergies In EMR: Yes  Reviewed Surgeries / Procedures: Yes  Date of Onset of Symptoms: 05/04/2012  Treatments Tried: Hydrocodone/Acetaminophen  Treatments Tried Worked: Yes  Guideline(s) Used:  Back Injury  Disposition Per Guideline:   See Within 3 Days in Office  Reason For Disposition Reached:   Injury and pain has not improved after 3 days  Advice Given:  Reassurance - Bending or Twisting Injury (Strain, Sprain):  The main symptom is pain that gets worse with movement.  Avoid:  Avoid heavy lifting and any sports activities for the first week after an injury.  Call Back If:  Severe pain lasts over 2 hours after pain medicine and ice  Pain not improving after 3 days  Pain or swelling lasts over 7 days  You become worse.  Call Back If:  You have more questions  You become worse.  Patient Refused Recommendation:  Patient Will Follow Up With Office Later  Patient declines offered appt. for 05/17/12. Patient states she would like Dr. Silvio Pate to review above and advise. Patient uses Data processing manager on [employer] at [phone]. Please return call to patient at [phone]. Call back parameters reviewed. Patient verbalizes understanding.

## 2012-05-22 MED ORDER — HYDROCODONE-ACETAMINOPHEN 5-325 MG PO TABS: 1.0000 | ORAL_TABLET | Freq: Four times a day (QID) | ORAL | Status: DC | PRN

## 2012-05-22 MED ORDER — DIAZEPAM 2 MG PO TABS: 2.0000 mg | ORAL_TABLET | Freq: Two times a day (BID) | ORAL | Status: DC | PRN

## 2012-05-22 NOTE — Telephone Encounter (Signed)
rx called into pharmacy

## 2012-05-22 NOTE — Telephone Encounter (Signed)
Spoke with patient and advised results  Also pt would like refills, ok to fill? Dr.Letvak's patient. Dr.Bedsole pt states she takes at least 4 hydrocodone per day. Please advise

## 2012-06-07 ENCOUNTER — Other Ambulatory Visit: Payer: Self-pay | Admitting: Family Medicine

## 2012-06-07 ENCOUNTER — Other Ambulatory Visit: Payer: Self-pay | Admitting: Internal Medicine

## 2012-06-07 NOTE — Telephone Encounter (Signed)
rx sent to pharmacy by e-script  

## 2012-06-07 NOTE — Telephone Encounter (Signed)
Okay #60 x 0 

## 2012-06-07 NOTE — Telephone Encounter (Signed)
Last filled 05/10/12

## 2012-06-08 ENCOUNTER — Other Ambulatory Visit: Payer: Self-pay | Admitting: Internal Medicine

## 2012-06-08 NOTE — Telephone Encounter (Signed)
rx sent to pharmacy by e-script  

## 2012-06-08 NOTE — Telephone Encounter (Signed)
Okay to refill for a year 

## 2012-06-14 ENCOUNTER — Other Ambulatory Visit: Payer: Self-pay | Admitting: Internal Medicine

## 2012-06-14 NOTE — Telephone Encounter (Signed)
Okay #90 x 1

## 2012-06-14 NOTE — Telephone Encounter (Signed)
Rx sent to pharmacy by escript

## 2012-07-05 ENCOUNTER — Other Ambulatory Visit: Payer: Self-pay | Admitting: Internal Medicine

## 2012-07-18 ENCOUNTER — Other Ambulatory Visit: Payer: Self-pay | Admitting: Internal Medicine

## 2012-07-23 ENCOUNTER — Other Ambulatory Visit: Payer: Self-pay

## 2012-07-23 MED ORDER — AMLODIPINE BESYLATE 10 MG PO TABS: ORAL_TABLET | ORAL | Status: DC

## 2012-07-23 NOTE — Telephone Encounter (Signed)
Pt request refill amlodipine to [facility]pt notified done.

## 2012-08-02 ENCOUNTER — Other Ambulatory Visit: Payer: Self-pay | Admitting: Internal Medicine

## 2012-08-07 ENCOUNTER — Other Ambulatory Visit: Payer: Self-pay | Admitting: Internal Medicine

## 2012-08-07 MED ORDER — SIMVASTATIN 20 MG PO TABS: ORAL_TABLET | ORAL | Status: DC

## 2012-08-07 NOTE — Telephone Encounter (Signed)
Pt request refill simvastatin to [facility] [place] pt done. Pt already scheduled cpx on 09/11/12.

## 2012-08-17 ENCOUNTER — Other Ambulatory Visit: Payer: Self-pay | Admitting: *Deleted

## 2012-08-17 NOTE — Telephone Encounter (Signed)
Faxed request from [facility], faxed back with refills

## 2012-08-20 ENCOUNTER — Other Ambulatory Visit: Payer: Self-pay | Admitting: Family Medicine

## 2012-08-20 NOTE — Telephone Encounter (Signed)
Okay #180 x 0

## 2012-08-20 NOTE — Telephone Encounter (Signed)
rx called into pharmacy

## 2012-08-23 NOTE — Telephone Encounter (Signed)
Pt left v/m requesting status of diabetic supplies sent to [facility]; pt notified done on 08/17/12 and pt will ck with [facility] next week if supplies not delivered.

## 2012-08-27 ENCOUNTER — Other Ambulatory Visit: Payer: Self-pay

## 2012-08-27 MED ORDER — TRAMADOL HCL 50 MG PO TABS: ORAL_TABLET | ORAL | Status: DC

## 2012-08-27 NOTE — Telephone Encounter (Signed)
Pt left v/m; having a lot of problems with back pain; pt request refill Tramadol to [facility]..Please advise.

## 2012-09-05 ENCOUNTER — Other Ambulatory Visit: Payer: Self-pay | Admitting: Internal Medicine

## 2012-09-10 ENCOUNTER — Encounter: Payer: Medicare Other | Admitting: Internal Medicine

## 2012-09-10 ENCOUNTER — Encounter: Payer: Self-pay | Admitting: *Deleted

## 2012-09-11 ENCOUNTER — Ambulatory Visit (INDEPENDENT_AMBULATORY_CARE_PROVIDER_SITE_OTHER): Payer: Medicare PPO | Admitting: Internal Medicine

## 2012-09-11 ENCOUNTER — Encounter: Payer: Self-pay | Admitting: Internal Medicine

## 2012-09-11 VITALS — BP 140/70 | HR 63 | Temp 97.9°F | Wt 169.0 lb

## 2012-09-11 DIAGNOSIS — M545 Low back pain, unspecified: Secondary | ICD-10-CM

## 2012-09-11 DIAGNOSIS — F329 Major depressive disorder, single episode, unspecified: Secondary | ICD-10-CM

## 2012-09-11 DIAGNOSIS — F3289 Other specified depressive episodes: Secondary | ICD-10-CM

## 2012-09-11 DIAGNOSIS — I10 Essential (primary) hypertension: Secondary | ICD-10-CM

## 2012-09-11 DIAGNOSIS — E1149 Type 2 diabetes mellitus with other diabetic neurological complication: Secondary | ICD-10-CM

## 2012-09-11 DIAGNOSIS — M810 Age-related osteoporosis without current pathological fracture: Secondary | ICD-10-CM

## 2012-09-11 DIAGNOSIS — Z0000 Encounter for general adult medical examination without abnormal findings: Secondary | ICD-10-CM

## 2012-09-11 DIAGNOSIS — E785 Hyperlipidemia, unspecified: Secondary | ICD-10-CM

## 2012-09-11 LAB — LIPID PANEL
HDL: 38.8 mg/dL — ABNORMAL LOW (ref >39.00)
Triglycerides: 499 mg/dL — ABNORMAL HIGH (ref 0.0–149.0)
VLDL: 99.8 mg/dL — ABNORMAL HIGH (ref 0.0–40.0)

## 2012-09-11 LAB — BASIC METABOLIC PANEL
BUN: 18 mg/dL (ref 6–23)
Calcium: 9.5 mg/dL (ref 8.4–10.5)
GFR: 63.47 mL/min (ref >60.00)
Potassium: 4.6 mEq/L (ref 3.5–5.1)
Sodium: 135 mEq/L (ref 135–145)

## 2012-09-11 LAB — CBC WITH DIFFERENTIAL/PLATELET
Basophils Absolute: 0.1 10*3/uL (ref 0.0–0.1)
Eosinophils Relative: 3.8 % (ref 0.0–5.0)
HCT: 42.4 % (ref 36.0–46.0)
Hemoglobin: 14.5 g/dL (ref 12.0–15.0)
Lymphocytes Relative: 23.3 % (ref 12.0–46.0)
Lymphs Abs: 2.5 10*3/uL (ref 0.7–4.0)
Monocytes Relative: 5 % (ref 3.0–12.0)
Platelets: 350 10*3/uL (ref 150.0–400.0)
RDW: 12.8 % (ref 11.5–14.6)
WBC: 10.8 10*3/uL — ABNORMAL HIGH (ref 4.5–10.5)

## 2012-09-11 LAB — HEPATIC FUNCTION PANEL
AST: 57 U/L — ABNORMAL HIGH (ref 0–37)
Albumin: 4.1 g/dL (ref 3.5–5.2)
Alkaline Phosphatase: 166 U/L — ABNORMAL HIGH (ref 39–117)
Bilirubin, Direct: 0 mg/dL (ref 0.0–0.3)

## 2012-09-11 LAB — TSH: TSH: 2.71 u[IU]/mL (ref 0.35–5.50)

## 2012-09-11 MED ORDER — METFORMIN HCL ER 500 MG PO TB24: 1000.0000 mg | ORAL_TABLET | Freq: Every day | ORAL | Status: DC

## 2012-09-11 NOTE — Assessment & Plan Note (Signed)
Doing okay with the fluoxetine

## 2012-09-11 NOTE — Assessment & Plan Note (Signed)
Will increase the tramadol to tid and cut out robaxin

## 2012-09-11 NOTE — Progress Notes (Signed)
Subjective:    Patient ID: Jaclyn Peterson, female    DOB: 1940-10-25, 72 y.o.   MRN: [id number]  HPI Here for physical  Still has back pain Better than after the fall Only intermittent with the diazepam Robaxin makes her tired Tramadol does help---asks about increasing this dose No longer taking the hydrocodone Tries to walk on the treadmill regularly Uses the lidoderm also  Daughter, SIL and family still living with her  Continues on gluten free diet Uses the cholestyramine for chronic diarrhea  Checks sugars every morning Usually 120 (since she got a new meter) No hypoglycemic reactions Still sees Dr Tobe Sos for her eyes cymbalta helps her neuropathic pain--uses cane when in yard or other unlevel ground  Current Outpatient Prescriptions on File Prior to Visit  Medication Sig Dispense Refill  . alendronate (FOSAMAX) 70 MG tablet Take 70 mg by mouth every 7 (seven) days.       Marland Kitchen amLODipine (NORVASC) 10 MG tablet TAKE 1 TABLET DAILY  90 tablet  0  . aspirin 81 MG tablet Take 81 mg by mouth daily.        . Calcium Carb-Cholecalciferol (OS-CAL 500 + D) 500-600 MG-UNIT TABS Take by mouth daily.        . cholestyramine (QUESTRAN) 4 G packet Take 1 packet by mouth daily.  60 packet  3  . DULoxetine (CYMBALTA) 60 MG capsule TAKE 1 CAPSULE BY MOUTH TWICE DAILY  120 capsule  0  . Estropipate (OGEN 0.625 PO) Take 1 tablet by mouth daily.        Marland Kitchen FLUoxetine (PROZAC) 40 MG capsule Take 1 capsule (40 mg total) by mouth daily.  90 capsule  1  . glucose blood (TRUETEST TEST) test strip Use as instructed to test blood sugar once daily dx: 250.60      . lidocaine (LIDODERM) 5 % PLACE 1 PATCH ONTO SKIN EVERY DAY . REMOVE AND DISCARD PATCH WITHIN 12 HOURS OR AS DIRECTED BY MD  30 patch  0  . meloxicam (MOBIC) 15 MG tablet TAKE 1 TABLET BY MOUTH EVERY DAY  30 tablet  11  . metFORMIN (GLUCOPHAGE) 500 MG tablet TAKE 1 TABLET TWICE A DAY  180 tablet  0  . propranolol (INDERAL) 40 MG tablet  Take 1 tablet (40 mg total) by mouth 2 (two) times daily.  90 tablet  1  . senna-docusate (SENOKOT-S) 8.6-50 MG per tablet Take 1 tablet by mouth 2 (two) times daily.        . simvastatin (ZOCOR) 20 MG tablet TAKE 1 TABLET DAILY FOR HIGH BLOOD PRESSURE  90 tablet  0  . traMADol (ULTRAM) 50 MG tablet TAKE 1 TABLET BY MOUTH TWICE DAILY AS NEEDED FOR PAIN  60 tablet  0   No current facility-administered medications on file prior to visit.    Allergies  Allergen Reactions  . Bisoprolol Fumarate     REACTION: ha  . Chlorthalidone   . Lisinopril     REACTION: throat symptoms  . Tetracyclines & Related Swelling    Throat swelling    Past Medical History  Diagnosis Date  . Depression   . Diabetes mellitus   . Hyperlipidemia   . Hypertension   . Osteoporosis   . 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 Chronic pain   . SBO (small bowel obstruction)     obstipation  10/2005  . Falls frequently     Falls/pneumonia 1/12    Past Surgical History  Procedure Laterality Date  . Appendectomy      1946  Adhesions shortly after  . Abdominal hysterectomy      BSO 1985  cancer  . Kidney stone surgery      cysto-- [place] 2003  . Bladder repair      bladder tacking- chronic pain since  1998  . Back surgery  1970's  . Cholecystectomy      lysis of adhesions--post op complications, trach, etc  8/10  . Cataract extraction  12/12    left and then laser---Dr Tobe Sos    Family History  Problem Relation Age of Onset  . Coronary artery disease Father   . Hypertension Neg Hx   . Diabetes Neg Hx     History   Social History  . Marital Status: Married    Spouse Name: N/A    Number of Children: 2  . Years of Education: N/A   Occupational History  . 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 Smoking status: Current Some Day Smoker  . Smokeless tobacco: Never Used     Comment: only once in a while. Gave info on 1-800-QUIT NOW  . Alcohol Use: No  . Drug  Use: No  . Sexually Active: Not on file   Other Topics Concern  . Not on file   Social History Narrative   Enjoys crafts      No living will   Daughter, SIL, kids living with her know   Daughter, [name], should make health care decisions if POA needed   Would accept resuscitation attempts but no prolonged artificial ventilation   No feeding tubes if cognitively unaware            Review of Systems  Constitutional: Negative for fatigue and unexpected weight change.       Wears seat belt  HENT: Positive for congestion and rhinorrhea. Negative for hearing loss, dental problem and tinnitus.        Full dentures  Eyes: Negative for visual disturbance.  Respiratory: Negative for cough, chest tightness and shortness of breath.   Cardiovascular: Negative for chest pain, palpitations and leg swelling.  Gastrointestinal: Negative for nausea, vomiting and blood in stool.       No heartburn Bowels okay with cholestyramine  Endocrine: Negative for cold intolerance and heat intolerance.  Genitourinary: Positive for difficulty urinating. Negative for dysuria and hematuria.       Intermittent incontinence (leaking--urge)  Musculoskeletal: Positive for back pain and arthralgias. Negative for joint swelling.  Skin: Negative for rash.       Scattered bumps under skin  Allergic/Immunologic: Negative for environmental allergies and immunocompromised state.  Neurological: Positive for numbness. Negative for dizziness, syncope, weakness, light-headedness and headaches.       No headaches on the inderal  Hematological: Negative for adenopathy. Does not bruise/bleed easily.  Psychiatric/Behavioral: Positive for dysphoric mood. Negative for sleep disturbance. The patient is not nervous/anxious.        Some recent depressed mood--relates to her back pain       Objective:   Physical Exam  Constitutional: She is oriented to person, place, and time. She appears well-developed and well-nourished. No  distress.  HENT:  Head: Normocephalic and atraumatic.  Right Ear: External ear normal.  Left Ear: External ear normal.  Mouth/Throat: Oropharynx is clear and moist. No oropharyngeal exudate.  Eyes: Conjunctivae and EOM are normal. Pupils are equal, round, and reactive to light.  Neck: Normal  range of motion. Neck supple. No thyromegaly present.  Cardiovascular: Normal rate, regular rhythm, normal heart sounds and intact distal pulses.  Exam reveals no gallop.   No murmur heard. Pulmonary/Chest: Effort normal and breath sounds normal. No respiratory distress. She has no wheezes. She has no rales.  Abdominal: Soft. There is no tenderness.  Musculoskeletal: She exhibits no edema and no tenderness.  Lymphadenopathy:    She has no cervical adenopathy.  Neurological: She is alert and oriented to person, place, and time.  Skin: No rash noted. No erythema.  Psychiatric: She has a normal mood and affect. Her behavior is normal.          Assessment & Plan:

## 2012-09-11 NOTE — Assessment & Plan Note (Signed)
BP Readings from Last 3 Encounters:  09/11/12 140/70  05/11/12 124/80  05/04/12 158/70   Still acceptable

## 2012-09-11 NOTE — Assessment & Plan Note (Signed)
UTD on imms, colon and mammogram Discussed fitness

## 2012-09-11 NOTE — Assessment & Plan Note (Signed)
Has been on the fosamax at least 5 years Will stop

## 2012-09-11 NOTE — Patient Instructions (Addendum)
You only need mammograms every 2 years now. Consider [employer] and speaking to a trainer about core training (this will help you.r back) Stop the alendronate (fosamax) Please change to the long acting metformin (prescription sent)---- this may help your diarrhea

## 2012-09-11 NOTE — Assessment & Plan Note (Signed)
Still seems to have good control Will change metformin to ER to see if that helps diarrhea

## 2012-09-13 ENCOUNTER — Other Ambulatory Visit: Payer: Self-pay | Admitting: Internal Medicine

## 2012-09-18 ENCOUNTER — Encounter: Payer: Self-pay | Admitting: *Deleted

## 2012-09-20 ENCOUNTER — Other Ambulatory Visit: Payer: Self-pay | Admitting: *Deleted

## 2012-09-20 NOTE — Telephone Encounter (Signed)
Last filled 08/27/12 

## 2012-09-20 NOTE — Telephone Encounter (Signed)
Pt left v/m requesting status of Tramadol refill to [place] request cb.

## 2012-09-21 MED ORDER — TRAMADOL HCL 50 MG PO TABS: 50.0000 mg | ORAL_TABLET | Freq: Three times a day (TID) | ORAL | Status: DC | PRN

## 2012-09-21 NOTE — Telephone Encounter (Signed)
Refill sent to pharmacy.   

## 2012-09-21 NOTE — Telephone Encounter (Signed)
Okay #60 x 0 

## 2012-09-25 ENCOUNTER — Encounter: Payer: Self-pay | Admitting: Internal Medicine

## 2012-10-01 ENCOUNTER — Other Ambulatory Visit: Payer: Self-pay | Admitting: Internal Medicine

## 2012-10-01 ENCOUNTER — Other Ambulatory Visit: Payer: Self-pay

## 2012-10-01 DIAGNOSIS — R748 Abnormal levels of other serum enzymes: Secondary | ICD-10-CM

## 2012-10-01 NOTE — Telephone Encounter (Signed)
Pt left vm that when pt was seen 09/11/12 pt told Dr Silvio Pate she was stopping Robaxin; pt needs to restart and get refill on Robaxin for back spasms (pt said she cannot do without robaxin). Pt also questioned quantity of Tramadol since increased instructions to tid prn; pt thought quantity would be increased from # 60 60 # 90.Marland KitchenPlease advise.[place].

## 2012-10-02 MED ORDER — METHOCARBAMOL 500 MG PO TABS: ORAL_TABLET | ORAL | Status: DC

## 2012-10-02 NOTE — Telephone Encounter (Signed)
rx sent to pharmacy by e-script Spoke with patient and advised results

## 2012-10-02 NOTE — Telephone Encounter (Signed)
Okay to refill the methocarbamol #90 x 0  I don't want her using more tramadol. She has gotten in trouble with medications in the past. Changing to tid was to allow occasional times when she needed more but on average I expect she will try to use no more than 2 a day

## 2012-10-09 ENCOUNTER — Telehealth: Payer: Self-pay

## 2012-10-09 ENCOUNTER — Other Ambulatory Visit (INDEPENDENT_AMBULATORY_CARE_PROVIDER_SITE_OTHER): Payer: Medicare PPO

## 2012-10-09 DIAGNOSIS — R748 Abnormal levels of other serum enzymes: Secondary | ICD-10-CM

## 2012-10-09 LAB — HEPATIC FUNCTION PANEL
ALT: 20 U/L (ref 0–35)
AST: 21 U/L (ref 0–37)
Albumin: 4.4 g/dL (ref 3.5–5.2)
Alkaline Phosphatase: 104 U/L (ref 39–117)
Bilirubin, Direct: 0 mg/dL (ref 0.0–0.3)
Total Protein: 7.9 g/dL (ref 6.0–8.3)

## 2012-10-09 NOTE — Telephone Encounter (Signed)
If hasn't been done since 7/10, can fill #90 x 0 now

## 2012-10-09 NOTE — Telephone Encounter (Signed)
Let her know I will increase the next time to #90 She is not to take more than 3 a day or I will stop prescribing it She should limit the acetaminophen to no more than 3061m a day

## 2012-10-09 NOTE — Telephone Encounter (Signed)
Pt came by office for clarification of instructions for Tramadol; reviewed with pt; can take Tramadol TID only occasionally not on daily basis. Pt to try to take Tramadol BID. Pt said her back had been bothering her since fall in Jan but she has clear understanding of instructions now. Pt request refill Tramadol to [place] request cb when med called to pharmacy.

## 2012-10-09 NOTE — Telephone Encounter (Signed)
Spoke with patient and advised results, she will limit her use of acetaminophen, she was taking 10 per day. Patient asks when can she get a refill on tramadol? Last filled 09/20/12 pt have enough left for 1 day.

## 2012-10-10 LAB — HEPATITIS PANEL, ACUTE: Hep A IgM: NEGATIVE

## 2012-10-10 MED ORDER — TRAMADOL HCL 50 MG PO TABS: 50.0000 mg | ORAL_TABLET | Freq: Three times a day (TID) | ORAL | Status: DC | PRN

## 2012-10-10 NOTE — Telephone Encounter (Signed)
rx called into pharmacy Spoke with patient and advised results

## 2012-10-12 ENCOUNTER — Other Ambulatory Visit: Payer: Self-pay | Admitting: Internal Medicine

## 2012-10-15 ENCOUNTER — Encounter: Payer: Self-pay | Admitting: *Deleted

## 2012-10-16 ENCOUNTER — Other Ambulatory Visit: Payer: Self-pay | Admitting: Internal Medicine

## 2012-11-01 ENCOUNTER — Other Ambulatory Visit: Payer: Self-pay | Admitting: Internal Medicine

## 2012-11-07 ENCOUNTER — Other Ambulatory Visit: Payer: Self-pay | Admitting: Internal Medicine

## 2012-11-07 NOTE — Telephone Encounter (Signed)
Tramadol phoned in to pharmacy.  Simvastatin sent electronically.

## 2012-11-07 NOTE — Telephone Encounter (Signed)
Okay #90 x 0 for tramadol Simvastatin for a year

## 2012-11-16 ENCOUNTER — Other Ambulatory Visit: Payer: Self-pay | Admitting: Internal Medicine

## 2012-11-16 NOTE — Telephone Encounter (Signed)
Pt checking on status of refill;Medication phoned to [facility] as instructed.pt notified done.

## 2012-11-16 NOTE — Telephone Encounter (Signed)
Okay diazepam #180 x 0  cymbalta for a year

## 2012-11-29 ENCOUNTER — Other Ambulatory Visit: Payer: Self-pay | Admitting: Internal Medicine

## 2012-11-29 NOTE — Telephone Encounter (Signed)
rx sent to pharmacy by e-script  

## 2012-11-29 NOTE — Telephone Encounter (Signed)
Okay #90 x 0 

## 2012-12-04 ENCOUNTER — Encounter: Payer: Self-pay | Admitting: Internal Medicine

## 2012-12-04 ENCOUNTER — Ambulatory Visit (INDEPENDENT_AMBULATORY_CARE_PROVIDER_SITE_OTHER): Payer: Medicare PPO | Admitting: Internal Medicine

## 2012-12-04 VITALS — BP 134/68 | HR 74 | Temp 97.2°F | Wt 169.8 lb

## 2012-12-04 DIAGNOSIS — R220 Localized swelling, mass and lump, head: Secondary | ICD-10-CM

## 2012-12-04 DIAGNOSIS — R221 Localized swelling, mass and lump, neck: Secondary | ICD-10-CM | POA: Insufficient documentation

## 2012-12-04 MED ORDER — CLINDAMYCIN HCL 300 MG PO CAPS: 300.0000 mg | ORAL_CAPSULE | Freq: Three times a day (TID) | ORAL | Status: DC

## 2012-12-04 NOTE — Patient Instructions (Signed)
Please try warm compresses on the neck nodule. If it worsens, start the antibiotic.

## 2012-12-04 NOTE — Assessment & Plan Note (Signed)
Not shingles as she feared Probably not MRSA but I fear that with several satellite lesions Will try warm compresses---- if worsens, try antibiotic

## 2012-12-04 NOTE — Progress Notes (Signed)
Subjective:    Patient ID: Jaclyn Peterson, female    DOB: 1940/12/28, 72 y.o.   MRN: [id number]  HPI Has a rash on her neck Has like a pimple at her former trach site Now some more bumps on upper chest (?and maybe on back)  No pain No fever but doesn't feel good  Current Outpatient Prescriptions on File Prior to Visit  Medication Sig Dispense Refill  . amLODipine (NORVASC) 10 MG tablet TAKE 1 TABLET BY MOUTH EVERY DAY  90 tablet  0  . aspirin 81 MG tablet Take 81 mg by mouth daily.        . Calcium Carb-Cholecalciferol (OS-CAL 500 + D) 500-600 MG-UNIT TABS Take by mouth daily.        . cholestyramine (QUESTRAN) 4 G packet Take 1 packet by mouth daily.  60 packet  3  . diazepam (VALIUM) 2 MG tablet TAKE 1 TABLET BY MOUTH TWICE DAILY  180 tablet  0  . DULoxetine (CYMBALTA) 60 MG capsule TAKE 1 CAPSULE BY MOUTH TWICE DAILY  120 capsule  6  . Estropipate (OGEN 0.625 PO) Take 1 tablet by mouth daily.        Marland Kitchen FLUoxetine (PROZAC) 40 MG capsule TAKE 1 CAPSULE BY MOUTH EVERY DAY  90 capsule  0  . glucose blood (TRUETEST TEST) test strip Use as instructed to test blood sugar once daily dx: 250.60      . lidocaine (LIDODERM) 5 % PLACE 1 PATCH ONTO SKIN EVERY DAY . REMOVE AND DISCARD PATCH WITHIN 12 HOURS OR AS DIRECTED BY MD  30 patch  0  . metFORMIN (GLUCOPHAGE-XR) 500 MG 24 hr tablet Take 2 tablets (1,000 mg total) by mouth daily with breakfast.  180 tablet  3  . methocarbamol (ROBAXIN) 500 MG tablet TAKE ONE TABLET BY MOUTH THREE TIMES DAILY  90 tablet  0  . propranolol (INDERAL) 40 MG tablet TAKE 1/2 TABLET BY MOUTH TWICE DAILY  90 tablet  0  . senna-docusate (SENOKOT-S) 8.6-50 MG per tablet Take 1 tablet by mouth 2 (two) times daily.        . simvastatin (ZOCOR) 20 MG tablet TAKE 1 TABLET BY MOUTH EVERY DAY FOR HIGH BLOOD PRESSURE  90 tablet  3  . traMADol (ULTRAM) 50 MG tablet TAKE 1 TABLET BY MOUTH THREE TIMES DAILY AS NEEDED  90 tablet  0   No current facility-administered  medications on file prior to visit.    Allergies  Allergen Reactions  . Bisoprolol Fumarate     REACTION: ha  . Chlorthalidone   . Lisinopril     REACTION: throat symptoms  . Tetracyclines & Related Swelling    Throat swelling    Past Medical History  Diagnosis Date  . Depression   . Diabetes mellitus   . Hyperlipidemia   . Hypertension   . Osteoporosis   . 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 Chronic pain   . SBO (small bowel obstruction)     obstipation  10/2005  . Falls frequently     Falls/pneumonia 1/12    Past Surgical History  Procedure Laterality Date  . Appendectomy      1946  Adhesions shortly after  . Abdominal hysterectomy      BSO 1985  cancer  . Kidney stone surgery      cysto-- [facility] 2003  . Bladder repair      bladder tacking- chronic pain since  1998  . Back surgery  1970's  . Cholecystectomy      lysis of adhesions--post op complications, trach, etc  8/10  . Cataract extraction  12/12    left and then laser---Dr Tobe Sos    Family History  Problem Relation Age of Onset  . Coronary artery disease Father   . Hypertension Neg Hx   . Diabetes Neg Hx     History   Social History  . Marital Status: Married    Spouse Name: N/A    Number of Children: 2  . Years of Education: N/A   Occupational History  . 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 Smoking status: Current Some Day Smoker  . Smokeless tobacco: Never Used     Comment: only once in a while. Gave info on 1-800-QUIT NOW  . Alcohol Use: No  . Drug Use: No  . Sexual Activity: Not on file   Other Topics Concern  . Not on file   Social History Narrative   Enjoys crafts      No living will   Daughter, SIL, kids living with her know   Daughter, [name], should make health care decisions if POA needed   Would accept resuscitation attempts but no prolonged artificial ventilation   No feeding tubes if cognitively unaware            Review  of Systems Appetite is fair No trouble swallowing No nausea or vomiting     Objective:   Physical Exam  Neck:  Has red nodule at trach site Slightly tender ?slight white discharge from top  Skin:  2-3 red papules around anterior neck          Assessment & Plan:

## 2012-12-10 ENCOUNTER — Other Ambulatory Visit: Payer: Self-pay | Admitting: Internal Medicine

## 2012-12-10 NOTE — Telephone Encounter (Signed)
Last filled 11/07/12

## 2012-12-10 NOTE — Telephone Encounter (Signed)
Okay #90 x 0 

## 2012-12-10 NOTE — Telephone Encounter (Signed)
Pt wanted to ck on status of refill; advised  Refill done and pt will pick up at [facility].

## 2012-12-10 NOTE — Telephone Encounter (Signed)
rx called into pharmacy

## 2012-12-12 LAB — HM DIABETES EYE EXAM

## 2012-12-27 ENCOUNTER — Other Ambulatory Visit: Payer: Self-pay | Admitting: Internal Medicine

## 2013-01-07 ENCOUNTER — Other Ambulatory Visit: Payer: Self-pay | Admitting: Internal Medicine

## 2013-01-07 NOTE — Telephone Encounter (Signed)
Oh, I thought I saw #270  If she only got 90, you can send order for #90 x 0 now

## 2013-01-07 NOTE — Telephone Encounter (Signed)
Okay propranolol for a year  Please check with her about the tramadol--shouldn't be due for 2 more months

## 2013-01-07 NOTE — Telephone Encounter (Signed)
She only got 90 on 12/10/2012 and she's taking it three times daily

## 2013-01-07 NOTE — Telephone Encounter (Signed)
Tramadol last filled 12/10/2012

## 2013-01-08 MED ORDER — TRAMADOL HCL 50 MG PO TABS: 50.0000 mg | ORAL_TABLET | Freq: Three times a day (TID) | ORAL | Status: DC | PRN

## 2013-01-08 MED ORDER — PROPRANOLOL HCL 40 MG PO TABS: 20.0000 mg | ORAL_TABLET | Freq: Two times a day (BID) | ORAL | Status: DC

## 2013-01-08 NOTE — Telephone Encounter (Signed)
Pt left note with Jaclyn Peterson requesting refills tramadol and propranolol. Pt request cb.

## 2013-01-08 NOTE — Telephone Encounter (Signed)
Pt calling on refills of proranolol and tramadol; pt request cb when refills done.

## 2013-01-08 NOTE — Telephone Encounter (Addendum)
#  90 of tramadol phoned in rx sent to pharmacy by e-script propranolol Spoke with patient and advised results

## 2013-01-08 NOTE — Addendum Note (Signed)
Addended by: Despina Hidden on: 01/08/2013 02:41 PM   Modules accepted: Orders

## 2013-01-08 NOTE — Telephone Encounter (Signed)
Pt is asking for 68mh supply which would be #270, please advise

## 2013-01-15 ENCOUNTER — Other Ambulatory Visit: Payer: Self-pay | Admitting: Internal Medicine

## 2013-01-24 ENCOUNTER — Other Ambulatory Visit: Payer: Self-pay | Admitting: Internal Medicine

## 2013-02-05 ENCOUNTER — Other Ambulatory Visit: Payer: Self-pay | Admitting: Internal Medicine

## 2013-02-05 NOTE — Telephone Encounter (Signed)
rx called into pharmacy

## 2013-02-05 NOTE — Telephone Encounter (Signed)
Okay #90 x 0 

## 2013-02-05 NOTE — Telephone Encounter (Signed)
Last filled 01/08/13

## 2013-02-05 NOTE — Telephone Encounter (Signed)
Pt called for status of refill; spoke with Baxter Flattery at [facility] and rx will be ready for pick up today between 4:30-5 pm. Pt voiced understanding.

## 2013-02-15 ENCOUNTER — Other Ambulatory Visit: Payer: Self-pay | Admitting: Internal Medicine

## 2013-02-15 NOTE — Telephone Encounter (Signed)
Last office visit 12/04/2012.  Ok to refill?

## 2013-02-16 NOTE — Telephone Encounter (Signed)
Okay #180 x 0

## 2013-02-18 ENCOUNTER — Other Ambulatory Visit: Payer: Self-pay | Admitting: *Deleted

## 2013-02-18 NOTE — Telephone Encounter (Signed)
Okay #180 x 0

## 2013-02-18 NOTE — Telephone Encounter (Signed)
Rx called to pharmacy

## 2013-02-19 NOTE — Telephone Encounter (Signed)
This was done Can you get rid of this note

## 2013-02-20 ENCOUNTER — Other Ambulatory Visit: Payer: Self-pay | Admitting: Internal Medicine

## 2013-03-01 ENCOUNTER — Other Ambulatory Visit: Payer: Self-pay | Admitting: Internal Medicine

## 2013-03-01 NOTE — Telephone Encounter (Signed)
rx called into pharmacy

## 2013-03-01 NOTE — Telephone Encounter (Signed)
Okay #90 x 0 She is early but I will fill---she shouldn't be calling before ~1/20 for the next one

## 2013-03-01 NOTE — Telephone Encounter (Signed)
Last filled 02/05/13

## 2013-03-19 ENCOUNTER — Other Ambulatory Visit: Payer: Self-pay | Admitting: Internal Medicine

## 2013-03-19 ENCOUNTER — Ambulatory Visit: Payer: Medicare PPO | Admitting: Internal Medicine

## 2013-03-28 ENCOUNTER — Other Ambulatory Visit: Payer: Self-pay | Admitting: Internal Medicine

## 2013-03-28 NOTE — Telephone Encounter (Signed)
Last filled 03/01/13

## 2013-03-28 NOTE — Telephone Encounter (Signed)
Okay #90 x 0 

## 2013-03-28 NOTE — Telephone Encounter (Signed)
Pt checking on status of tramadol refill; advised pt would call in now. Medication phoned to [facility] spoke with Baxter Flattery as instructed.

## 2013-04-03 ENCOUNTER — Other Ambulatory Visit: Payer: Self-pay | Admitting: Internal Medicine

## 2013-04-03 NOTE — Telephone Encounter (Signed)
Questran last filled 01/17/12 Lidocaine 06/08/12 Please advise

## 2013-04-03 NOTE — Telephone Encounter (Signed)
Okay to fill both for a year

## 2013-04-03 NOTE — Telephone Encounter (Signed)
rx sent to pharmacy by e-script  

## 2013-04-10 ENCOUNTER — Telehealth: Payer: Self-pay | Admitting: *Deleted

## 2013-04-10 NOTE — Telephone Encounter (Signed)
Form done 

## 2013-04-10 NOTE — Telephone Encounter (Signed)
Received prior auth request for Lidocaine. Auth paperwork obtained and placed in your inbox.

## 2013-04-15 ENCOUNTER — Other Ambulatory Visit: Payer: Self-pay | Admitting: Internal Medicine

## 2013-04-15 NOTE — Telephone Encounter (Signed)
Prior auth forms submitted, pending notification from insurance company.

## 2013-04-17 NOTE — Telephone Encounter (Signed)
Prior auth denied by insurance, will place in your inbox to be signed then scanned into pt's chart.

## 2013-04-22 NOTE — Telephone Encounter (Signed)
Let her know I am not sure why they won't approve the patch She will have to pay for these if she wants them--- I am very frustrated about the patch denial

## 2013-04-23 ENCOUNTER — Ambulatory Visit (INDEPENDENT_AMBULATORY_CARE_PROVIDER_SITE_OTHER): Payer: Medicare PPO | Admitting: Internal Medicine

## 2013-04-23 ENCOUNTER — Encounter: Payer: Self-pay | Admitting: Internal Medicine

## 2013-04-23 VITALS — BP 100/60 | HR 52 | Temp 97.7°F | Wt 172.0 lb

## 2013-04-23 DIAGNOSIS — E1142 Type 2 diabetes mellitus with diabetic polyneuropathy: Secondary | ICD-10-CM

## 2013-04-23 DIAGNOSIS — F3289 Other specified depressive episodes: Secondary | ICD-10-CM

## 2013-04-23 DIAGNOSIS — J069 Acute upper respiratory infection, unspecified: Secondary | ICD-10-CM

## 2013-04-23 DIAGNOSIS — G8929 Other chronic pain: Secondary | ICD-10-CM

## 2013-04-23 DIAGNOSIS — E1149 Type 2 diabetes mellitus with other diabetic neurological complication: Secondary | ICD-10-CM

## 2013-04-23 DIAGNOSIS — I10 Essential (primary) hypertension: Secondary | ICD-10-CM

## 2013-04-23 DIAGNOSIS — F329 Major depressive disorder, single episode, unspecified: Secondary | ICD-10-CM

## 2013-04-23 LAB — HM DIABETES FOOT EXAM

## 2013-04-23 LAB — HEMOGLOBIN A1C: Hgb A1c MFr Bld: 8.5 % — ABNORMAL HIGH (ref 4.6–6.5)

## 2013-04-23 MED ORDER — TRAMADOL HCL 50 MG PO TABS: 50.0000 mg | ORAL_TABLET | Freq: Three times a day (TID) | ORAL | Status: DC | PRN

## 2013-04-23 MED ORDER — BENZONATATE 200 MG PO CAPS: 200.0000 mg | ORAL_CAPSULE | Freq: Three times a day (TID) | ORAL | Status: DC | PRN

## 2013-04-23 MED ORDER — TIZANIDINE HCL 2 MG PO TABS: 2.0000 mg | ORAL_TABLET | Freq: Two times a day (BID) | ORAL | Status: DC | PRN

## 2013-04-23 NOTE — Progress Notes (Signed)
Pre-visit discussion using our clinic review tool. No additional management support is needed unless otherwise documented below in the visit note.  

## 2013-04-23 NOTE — Addendum Note (Signed)
Addended by: Despina Hidden on: 04/23/2013 11:06 AM   Modules accepted: Orders

## 2013-04-23 NOTE — Assessment & Plan Note (Signed)
Numbness without much pain  cymbalta seems to control this

## 2013-04-23 NOTE — Assessment & Plan Note (Signed)
Controlled on her meds

## 2013-04-23 NOTE — Addendum Note (Signed)
Addended by: Ellamae Sia on: 04/23/2013 11:12 AM   Modules accepted: Orders

## 2013-04-23 NOTE — Patient Instructions (Signed)
Please decrease the estrogen to every other day and then stop them. You may get some mild withdrawal symptoms after a few weeks---they should fade away over time. Please stop all your cigarettes. Call 1-800 [place] for help in doing this.

## 2013-04-23 NOTE — Progress Notes (Signed)
Subjective:    Patient ID: Jaclyn Peterson, female    DOB: Jan 12, 1941, 73 y.o.   MRN: [id number]  HPI Here for follow up Had bad fall 12/30---fell backwards and hit head Other falls as well  Bad head cold Mostly clear rhinorrhea No fever Some cough but no SOB  Dropped heavy object on left ankle ~1 month ago Still has some swelling at medial malleolus (though it hit the lateral side)  Checks sugars daily Usually under 120 fasting No hypoglycemic reactions  Still with chronic pain and muscle spasm Will change the methocarbamol to tizanidine for the insurance  Mood has been okay  Current Outpatient Prescriptions on File Prior to Visit  Medication Sig Dispense Refill  . amLODipine (NORVASC) 10 MG tablet TAKE 1 TABLET BY MOUTH EVERY DAY  90 tablet  0  . aspirin 81 MG tablet Take 81 mg by mouth daily.        . Calcium Carb-Cholecalciferol (OS-CAL 500 + D) 500-600 MG-UNIT TABS Take by mouth daily.        . cholestyramine (QUESTRAN) 4 G packet MIX ONE PACKET DAILY  60 each  11  . diazepam (VALIUM) 2 MG tablet TAKE 1 TABLET BY MOUTH TWICE DAILY  180 tablet  0  . DULoxetine (CYMBALTA) 60 MG capsule TAKE 1 CAPSULE BY MOUTH TWICE DAILY  120 capsule  6  . FLUoxetine (PROZAC) 40 MG capsule TAKE 1 CAPSULE BY MOUTH EVERY DAY  90 capsule  0  . glucose blood (TRUETEST TEST) test strip Use as instructed to test blood sugar once daily dx: 250.60      . metFORMIN (GLUCOPHAGE-XR) 500 MG 24 hr tablet Take 2 tablets (1,000 mg total) by mouth daily with breakfast.  180 tablet  3  . propranolol (INDERAL) 40 MG tablet TAKE 1/2 TABLET BY MOUTH TWICE DAILY  90 tablet  0  . senna-docusate (SENOKOT-S) 8.6-50 MG per tablet Take 1 tablet by mouth 2 (two) times daily.        . simvastatin (ZOCOR) 20 MG tablet TAKE 1 TABLET BY MOUTH EVERY DAY FOR HIGH BLOOD PRESSURE  90 tablet  3  . traMADol (ULTRAM) 50 MG tablet TAKE 1 TABLET BY MOUTH THREE TIMES DAILY AS NEEDED  90 tablet  0   No current  facility-administered medications on file prior to visit.    Allergies  Allergen Reactions  . Bisoprolol Fumarate     REACTION: ha  . Chlorthalidone   . Lisinopril     REACTION: throat symptoms  . Sulfa Antibiotics     Told by mother as a child  . Tetracyclines & Related Swelling    Throat swelling    Past Medical History  Diagnosis Date  . Depression   . Diabetes mellitus   . Hyperlipidemia   . Hypertension   . Osteoporosis   . 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 Chronic pain   . SBO (small bowel obstruction)     obstipation  10/2005  . Falls frequently     Falls/pneumonia 1/12    Past Surgical History  Procedure Laterality Date  . Appendectomy      1946  Adhesions shortly after  . Abdominal hysterectomy      BSO 1985  cancer  . Kidney stone surgery      cysto-- [facility] 2003  . Bladder repair      bladder tacking- chronic pain since  1998  . Back surgery  1970's  . Cholecystectomy  lysis of adhesions--post op complications, trach, etc  8/10  . Cataract extraction  12/12    left and then laser---Dr Tobe Sos    Family History  Problem Relation Age of Onset  . Coronary artery disease Father   . Hypertension Neg Hx   . Diabetes Neg Hx     History   Social History  . Marital Status: Married    Spouse Name: N/A    Number of Children: 2  . Years of Education: N/A   Occupational History  . 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 Smoking status: Current Some Day Smoker  . Smokeless tobacco: Never Used     Comment: only once in a while. Gave info on 1-800-QUIT NOW  . Alcohol Use: No  . Drug Use: No  . Sexual Activity: Not on file   Other Topics Concern  . Not on file   Social History Narrative   Enjoys crafts      No living will   Daughter, SIL, kids living with her know   Daughter, [name], should make health care decisions if POA needed   Would accept resuscitation attempts but no prolonged artificial  ventilation   No feeding tubes if cognitively unaware            Review of Systems Will be weaning off her estrogen Trouble initiating sleep but then sleeps well Appetite is fine Weight stable    Objective:   Physical Exam  Constitutional: She appears well-developed and well-nourished. No distress.  HENT:  Mouth/Throat: Oropharynx is clear and moist. No oropharyngeal exudate.  No sinus tenderness Mild nasal inflammation  Neck: Normal range of motion. Neck supple. No thyromegaly present.  Cardiovascular: Normal rate, regular rhythm, normal heart sounds and intact distal pulses.  Exam reveals no gallop.   No murmur heard. Faint pedal pulses  Pulmonary/Chest: Effort normal. No respiratory distress. She has no wheezes. She has no rales.  Musculoskeletal: She exhibits no edema and no tenderness.  Lymphadenopathy:    She has no cervical adenopathy.  Neurological:  Decreased sensation in feet  Psychiatric: She has a normal mood and affect. Her behavior is normal.          Assessment & Plan:

## 2013-04-23 NOTE — Assessment & Plan Note (Signed)
Seems to still have good control Will check A1c

## 2013-04-23 NOTE — Assessment & Plan Note (Signed)
Does okay with the tramadol Will change to tizanidine for the muscle spasm

## 2013-04-23 NOTE — Assessment & Plan Note (Signed)
Seems to be viral Supportive care

## 2013-04-23 NOTE — Telephone Encounter (Signed)
Spoke with patient and advised results at her OV today, she understands

## 2013-04-23 NOTE — Assessment & Plan Note (Signed)
BP Readings from Last 3 Encounters:  04/23/13 100/60  12/04/12 134/68  09/11/12 140/70   Good control

## 2013-04-24 ENCOUNTER — Telehealth: Payer: Self-pay | Admitting: Internal Medicine

## 2013-04-24 NOTE — Telephone Encounter (Signed)
Relevant patient education mailed to patient.  

## 2013-05-16 ENCOUNTER — Other Ambulatory Visit: Payer: Self-pay | Admitting: Internal Medicine

## 2013-05-16 NOTE — Telephone Encounter (Signed)
Okay diazepam #180 x 0 Tramadol #90 x 0

## 2013-05-16 NOTE — Telephone Encounter (Signed)
rx called into pharmacy

## 2013-06-13 ENCOUNTER — Other Ambulatory Visit: Payer: Self-pay | Admitting: Internal Medicine

## 2013-06-13 NOTE — Telephone Encounter (Signed)
rx called into pharmacy

## 2013-06-13 NOTE — Telephone Encounter (Signed)
Pt request status of tramadol refill; advised call to pharmacy; pt will ck with pharmacy.

## 2013-06-13 NOTE — Telephone Encounter (Signed)
Okay #90 x 0 

## 2013-06-28 ENCOUNTER — Other Ambulatory Visit: Payer: Self-pay | Admitting: Internal Medicine

## 2013-07-04 ENCOUNTER — Other Ambulatory Visit: Payer: Self-pay | Admitting: Internal Medicine

## 2013-07-04 DIAGNOSIS — E1149 Type 2 diabetes mellitus with other diabetic neurological complication: Secondary | ICD-10-CM

## 2013-07-11 ENCOUNTER — Other Ambulatory Visit: Payer: Self-pay | Admitting: Internal Medicine

## 2013-07-15 ENCOUNTER — Other Ambulatory Visit: Payer: Self-pay | Admitting: Internal Medicine

## 2013-07-15 LAB — HM DIABETES EYE EXAM

## 2013-07-15 NOTE — Telephone Encounter (Signed)
Approved: #90 x 0 

## 2013-07-15 NOTE — Telephone Encounter (Signed)
Pt left v/m requesting status of tramadol refill; pt request cb.

## 2013-07-15 NOTE — Telephone Encounter (Signed)
Okay #90 x 0 

## 2013-07-15 NOTE — Telephone Encounter (Signed)
06/13/13 

## 2013-07-15 NOTE — Telephone Encounter (Signed)
rx called into pharmacy

## 2013-07-16 ENCOUNTER — Encounter: Payer: Self-pay | Admitting: Radiology

## 2013-07-16 ENCOUNTER — Other Ambulatory Visit (INDEPENDENT_AMBULATORY_CARE_PROVIDER_SITE_OTHER): Payer: Medicare PPO

## 2013-07-16 DIAGNOSIS — E1149 Type 2 diabetes mellitus with other diabetic neurological complication: Secondary | ICD-10-CM

## 2013-07-16 LAB — HEMOGLOBIN A1C: HEMOGLOBIN A1C: 8.8 % — AB (ref 4.6–6.5)

## 2013-07-19 ENCOUNTER — Telehealth: Payer: Self-pay | Admitting: *Deleted

## 2013-07-19 MED ORDER — GLIPIZIDE 5 MG PO TABS: 2.5000 mg | ORAL_TABLET | Freq: Every day | ORAL | Status: DC

## 2013-07-19 NOTE — Telephone Encounter (Signed)
rx sent to pharmacy by e-script Spoke with patient and advised results

## 2013-07-19 NOTE — Telephone Encounter (Signed)
Message copied by Despina Hidden on Fri Jul 19, 2013  2:50 PM ------      Message from: Viviana Simpler I      Created: Tue Jul 16, 2013  1:46 PM       Please call her       The diabetes control is even worse      Please send Rx for glipizide 53m      1/2 tab before breakfast daily      #30 x 5      Have her call if she gets any low sugar reactions      I will recheck her diabetes test at her next appt ------

## 2013-08-02 ENCOUNTER — Other Ambulatory Visit: Payer: Self-pay | Admitting: Internal Medicine

## 2013-08-05 LAB — HM MAMMOGRAPHY: HM MAMMO: NORMAL

## 2013-08-13 ENCOUNTER — Other Ambulatory Visit: Payer: Self-pay | Admitting: Internal Medicine

## 2013-08-13 NOTE — Telephone Encounter (Signed)
Okay tramadol #90 x 0 Diazepam #180 x 0 (3 month supply)

## 2013-08-13 NOTE — Telephone Encounter (Signed)
rx called into pharmacy

## 2013-09-09 ENCOUNTER — Other Ambulatory Visit: Payer: Self-pay | Admitting: Internal Medicine

## 2013-09-09 NOTE — Telephone Encounter (Signed)
Okay #90 x 0 A few days early---okay if she doesn't call early again next month

## 2013-09-09 NOTE — Telephone Encounter (Signed)
rx called into pharmacy

## 2013-09-09 NOTE — Telephone Encounter (Signed)
Pt request refill of Tramadol sent to [facility]. Today because pt has to pick up other meds at pharmacy today and only wants to make one trip.Please advise.

## 2013-09-09 NOTE — Telephone Encounter (Signed)
08/13/13 

## 2013-09-19 ENCOUNTER — Other Ambulatory Visit: Payer: Self-pay | Admitting: Internal Medicine

## 2013-09-24 ENCOUNTER — Other Ambulatory Visit: Payer: Self-pay | Admitting: Internal Medicine

## 2013-10-07 ENCOUNTER — Other Ambulatory Visit: Payer: Self-pay | Admitting: Internal Medicine

## 2013-10-07 NOTE — Telephone Encounter (Signed)
09/09/13 

## 2013-10-07 NOTE — Telephone Encounter (Signed)
Okay amlodipine for a year  Tramadol #90 x 0

## 2013-10-07 NOTE — Telephone Encounter (Signed)
rx called into pharmacy rx sent to pharmacy by e-script

## 2013-10-15 ENCOUNTER — Other Ambulatory Visit: Payer: Self-pay | Admitting: Internal Medicine

## 2013-10-23 ENCOUNTER — Encounter: Payer: Medicare PPO | Admitting: Internal Medicine

## 2013-10-30 ENCOUNTER — Other Ambulatory Visit: Payer: Self-pay | Admitting: Internal Medicine

## 2013-11-01 ENCOUNTER — Encounter (INDEPENDENT_AMBULATORY_CARE_PROVIDER_SITE_OTHER): Payer: Self-pay

## 2013-11-01 ENCOUNTER — Encounter: Payer: Self-pay | Admitting: Internal Medicine

## 2013-11-01 ENCOUNTER — Ambulatory Visit (INDEPENDENT_AMBULATORY_CARE_PROVIDER_SITE_OTHER): Payer: Medicare PPO | Admitting: Internal Medicine

## 2013-11-01 VITALS — BP 140/68 | HR 64 | Temp 97.7°F | Wt 166.0 lb

## 2013-11-01 DIAGNOSIS — F39 Unspecified mood [affective] disorder: Secondary | ICD-10-CM

## 2013-11-01 DIAGNOSIS — Z0000 Encounter for general adult medical examination without abnormal findings: Secondary | ICD-10-CM

## 2013-11-01 DIAGNOSIS — E1149 Type 2 diabetes mellitus with other diabetic neurological complication: Secondary | ICD-10-CM

## 2013-11-01 DIAGNOSIS — I10 Essential (primary) hypertension: Secondary | ICD-10-CM

## 2013-11-01 DIAGNOSIS — Z23 Encounter for immunization: Secondary | ICD-10-CM

## 2013-11-01 DIAGNOSIS — E1142 Type 2 diabetes mellitus with diabetic polyneuropathy: Secondary | ICD-10-CM

## 2013-11-01 DIAGNOSIS — Z7189 Other specified counseling: Secondary | ICD-10-CM | POA: Insufficient documentation

## 2013-11-01 DIAGNOSIS — G8929 Other chronic pain: Secondary | ICD-10-CM

## 2013-11-01 LAB — HM DIABETES FOOT EXAM

## 2013-11-01 NOTE — Assessment & Plan Note (Signed)
I have personally reviewed the Medicare Annual Wellness questionnaire and have noted 1. The patient's medical and social history 2. Their use of alcohol, tobacco or illicit drugs 3. Their current medications and supplements 4. The patient's functional ability including ADL's, fall risks, home safety risks and hearing or visual             impairment. 5. Diet and physical activities 6. Evidence for depression or mood disorders  The patients weight, height, BMI and visual acuity have been recorded in the chart I have made referrals, counseling and provided education to the patient based review of the above and I have provided the pt with a written personalized care plan for preventive services.  I have provided you with a copy of your personalized plan for preventive services. Please take the time to review along with your updated medication list.  Will give pneumovax--no record she got that prevnar Yearly flu shot UTD on colonoscopy--probably doesn't need more Thinks she had mammo last year---will check with Dr Ammie Dalton

## 2013-11-01 NOTE — Assessment & Plan Note (Signed)
Depression in past  Some anxiety Doing well on current Rx No wean appropriate

## 2013-11-01 NOTE — Assessment & Plan Note (Signed)
Okay with the tramadol

## 2013-11-01 NOTE — Assessment & Plan Note (Signed)
Hopefully better with glipizide Will check labs

## 2013-11-01 NOTE — Assessment & Plan Note (Signed)
No sig pain so no meds needed

## 2013-11-01 NOTE — Progress Notes (Signed)
Pre visit review using our clinic review tool, if applicable. No additional management support is needed unless otherwise documented below in the visit note. 

## 2013-11-01 NOTE — Assessment & Plan Note (Signed)
See social history 

## 2013-11-01 NOTE — Addendum Note (Signed)
Addended by: Despina Hidden on: 11/01/2013 12:49 PM   Modules accepted: Orders

## 2013-11-01 NOTE — Progress Notes (Signed)
Subjective:    Patient ID: Jaclyn Peterson, female    DOB: 28-Nov-1940, 73 y.o.   MRN: [id number]  HPI Here for Medicare wellness and follow up Reviewed form and advanced directives Will have a rare cigarette--enjoys but infrequent (once a day or so). No alcohol Doesn't exercise--hopes to get back on the treadmill Did have another fall--lost balance and fell onto knees. Discussed using cane due to sensory changes Daughter helps with shopping but is independent with instrumental ADLs Reviewed other physicians--gets routine gyn care and mammograms at Dr Laury Axon Not as good at [facility] as fast. But no major cognitive changes Vision and hearing are fine  Mood has been good Daughter/SIL and 2 kids still with her--this is stressful No ongoing depression though  Has been somewhat noncompliant with diet Eats pretzels for snacking---was high after (300) Usually 170 fasting No hypoglycemic reactions Recent negative eye exam  No chest pain No palpitations No dizziness or syncope No SOB No edema  No myalgias or stomach trouble on the statin  Current Outpatient Prescriptions on File Prior to Visit  Medication Sig Dispense Refill  . amLODipine (NORVASC) 10 MG tablet TAKE 1 TABLET BY MOUTH EVERY DAY  90 tablet  3  . aspirin 81 MG tablet Take 81 mg by mouth daily.        . Calcium Carb-Cholecalciferol (OS-CAL 500 + D) 500-600 MG-UNIT TABS Take by mouth daily.        . cholestyramine (QUESTRAN) 4 G packet MIX ONE PACKET DAILY  60 each  11  . diazepam (VALIUM) 2 MG tablet TAKE 1 TABLET BY MOUTH TWICE DAILY  180 tablet  0  . DULoxetine (CYMBALTA) 60 MG capsule TAKE 1 CAPSULE BY MOUTH TWICE DAILY  120 capsule  6  . FLUoxetine (PROZAC) 40 MG capsule TAKE 1 CAPSULE BY MOUTH EVERY DAY  90 capsule  0  . glipiZIDE (GLUCOTROL) 5 MG tablet Take 0.5 tablets (2.5 mg total) by mouth daily before breakfast.  45 tablet  3  . metFORMIN (GLUCOPHAGE-XR) 500 MG 24 hr tablet TAKE 2 TABLETS BY  MOUTH EVERY DAY WITH BREAKFAST  180 tablet  1  . propranolol (INDERAL) 20 MG tablet TAKE 1 TABLET BY MOUTH TWICE DAILY  180 tablet  0  . senna-docusate (SENOKOT-S) 8.6-50 MG per tablet Take 1 tablet by mouth 2 (two) times daily.        . simvastatin (ZOCOR) 20 MG tablet TAKE 1 TABLET BY MOUTH EVERY DAY FOR HIGH BLOOD PRESSURE  90 tablet  3  . tiZANidine (ZANAFLEX) 2 MG tablet TAKE 1 TABLET BY MOUTH TWICE DAILY AS NEEDED FOR MUSCLE SPASMS  180 tablet  0  . traMADol (ULTRAM) 50 MG tablet TAKE 1 TABLET BY MOUTH THREE TIMES DAILY  90 tablet  0  . TRUETEST TEST test strip TEST  BLOOD  SUGAR ONE TIME DAILY AS DIRECTED  100 each  3   No current facility-administered medications on file prior to visit.    Allergies  Allergen Reactions  . Bisoprolol Fumarate     REACTION: ha  . Chlorthalidone   . Lisinopril     REACTION: throat symptoms  . Sulfa Antibiotics     Told by mother as a child  . Tetracyclines & Related Swelling    Throat swelling    Past Medical History  Diagnosis Date  . Depression   . Diabetes mellitus   . Hyperlipidemia   . Hypertension   . Osteoporosis   . Uterine cancer  P32 INSERT  . Chronic pain   . SBO (small bowel obstruction)     obstipation  10/2005  . Falls frequently     Falls/pneumonia 1/12    Past Surgical History  Procedure Laterality Date  . Appendectomy      1946  Adhesions shortly after  . Abdominal hysterectomy      BSO 1985  cancer  . Kidney stone surgery      cysto-- [facility] 2003  . Bladder repair      bladder tacking- chronic pain since  1998  . Back surgery  1970's  . Cholecystectomy      lysis of adhesions--post op complications, trach, etc  8/10  . Cataract extraction  12/12    left and then laser---Dr Tobe Sos    Family History  Problem Relation Age of Onset  . Coronary artery disease Father   . Hypertension Neg Hx   . Diabetes Neg Hx     History   Social History  . Marital Status: Married    Spouse Name: N/A     Number of Children: 2  . Years of Education: N/A   Occupational History  . 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 Smoking status: Current Some Day Smoker  . Smokeless tobacco: Never Used     Comment: only once in a while. Gave info on 1-800-QUIT NOW  . Alcohol Use: No  . Drug Use: No  . Sexual Activity: Not on file   Other Topics Concern  . Not on file   Social History Narrative   Enjoys crafts      No living will   Daughter, SIL, kids living with her know   Daughter, Jaclyn Peterson, should make health care decisions if POA needed   Would accept resuscitation attempts but no prolonged artificial ventilation   No feeding tubes if cognitively unaware            Review of Systems. Has noticed some easy bruising Sleeping well Appetite is good--trying to be careful Has lost 6# though Has been constipated some--- gets diarrhea if she doesn't take the cholestyramine. She tries to adjust the dose  Voids okay. Some mixed urinary urgency--wears pad but rarely is wet    Objective:   Physical Exam  Constitutional: She is oriented to person, place, and time. She appears well-developed and well-nourished. No distress.  HENT:  Mouth/Throat: Oropharynx is clear and moist. No oropharyngeal exudate.  Neck: Normal range of motion. Neck supple. No thyromegaly present.  Cardiovascular: Normal rate, regular rhythm, normal heart sounds and intact distal pulses.  Exam reveals no gallop.   No murmur heard. Pulmonary/Chest: Effort normal and breath sounds normal. No respiratory distress. She has no wheezes. She has no rales.  Abdominal: Soft. There is no tenderness.  Musculoskeletal: She exhibits no edema and no tenderness.  Lymphadenopathy:    She has no cervical adenopathy.  Neurological: She is alert and oriented to person, place, and time.  President -- "Joslyn Hy, [place]" [phone] [id number] Recall 2/3  Decreased sensation in feet   Skin: No rash noted. No erythema.  No foot lesions  Psychiatric: She has a normal mood and affect. Her behavior is normal.          Assessment & Plan:

## 2013-11-01 NOTE — Assessment & Plan Note (Signed)
BP Readings from Last 3 Encounters:  11/01/13 140/68  04/23/13 100/60  12/04/12 134/68   Good control

## 2013-11-02 LAB — CBC WITH DIFFERENTIAL/PLATELET
Basophils Absolute: 0.1 10*3/uL (ref 0.0–0.1)
Basophils Relative: 0.7 % (ref 0.0–3.0)
EOS PCT: 1.7 % (ref 0.0–5.0)
Eosinophils Absolute: 0.2 10*3/uL (ref 0.0–0.7)
HEMATOCRIT: 44.7 % (ref 36.0–46.0)
Hemoglobin: 15.3 g/dL — ABNORMAL HIGH (ref 12.0–15.0)
LYMPHS ABS: 2.8 10*3/uL (ref 0.7–4.0)
Lymphocytes Relative: 23.6 % (ref 12.0–46.0)
MCHC: 34.2 g/dL (ref 30.0–36.0)
MCV: 90.1 fl (ref 78.0–100.0)
Monocytes Absolute: 0.5 10*3/uL (ref 0.1–1.0)
Monocytes Relative: 4 % (ref 3.0–12.0)
NEUTROS PCT: 70 % (ref 43.0–77.0)
Neutro Abs: 8.2 10*3/uL — ABNORMAL HIGH (ref 1.4–7.7)
Platelets: 333 10*3/uL (ref 150.0–400.0)
RBC: 4.96 Mil/uL (ref 3.87–5.11)
RDW: 12.8 % (ref 11.5–15.5)
WBC: 11.7 10*3/uL — AB (ref 4.0–10.5)

## 2013-11-02 LAB — COMPREHENSIVE METABOLIC PANEL
ALT: 19 U/L (ref 0–35)
AST: 21 U/L (ref 0–37)
Albumin: 4.4 g/dL (ref 3.5–5.2)
Alkaline Phosphatase: 87 U/L (ref 39–117)
BILIRUBIN TOTAL: 0.4 mg/dL (ref 0.2–1.2)
BUN: 13 mg/dL (ref 6–23)
CO2: 30 mEq/L (ref 19–32)
CREATININE: 0.8 mg/dL (ref 0.4–1.2)
Calcium: 10.4 mg/dL (ref 8.4–10.5)
Chloride: 95 mEq/L — ABNORMAL LOW (ref 96–112)
GFR: 71.25 mL/min (ref >60.00)
GLUCOSE: 215 mg/dL — AB (ref 70–99)
Potassium: 3.6 mEq/L (ref 3.5–5.1)
SODIUM: 136 meq/L (ref 135–145)
TOTAL PROTEIN: 7.8 g/dL (ref 6.0–8.3)

## 2013-11-02 LAB — LIPID PANEL
Cholesterol: 206 mg/dL — ABNORMAL HIGH (ref 0–200)
HDL: 35.7 mg/dL — ABNORMAL LOW (ref >39.00)
NonHDL: 170.3
TRIGLYCERIDES: 744 mg/dL — AB (ref 0.0–149.0)
Total CHOL/HDL Ratio: 6
VLDL: 148.8 mg/dL — ABNORMAL HIGH (ref 0.0–40.0)

## 2013-11-02 LAB — LDL CHOLESTEROL, DIRECT: Direct LDL: 85.5 mg/dL

## 2013-11-02 LAB — MICROALBUMIN / CREATININE URINE RATIO
CREATININE, U: 157.3 mg/dL
MICROALB/CREAT RATIO: 227.5 mg/g — AB (ref 0.0–30.0)
Microalb, Ur: 357.8 mg/dL — ABNORMAL HIGH (ref 0.0–1.9)

## 2013-11-02 LAB — HEMOGLOBIN A1C: HEMOGLOBIN A1C: 8.9 % — AB (ref 4.6–6.5)

## 2013-11-02 LAB — T4, FREE: Free T4: 0.94 ng/dL (ref 0.60–1.60)

## 2013-11-04 ENCOUNTER — Other Ambulatory Visit: Payer: Self-pay | Admitting: Internal Medicine

## 2013-11-04 NOTE — Telephone Encounter (Signed)
rx called into pharmacy

## 2013-11-04 NOTE — Telephone Encounter (Signed)
10/07/13 

## 2013-11-04 NOTE — Telephone Encounter (Signed)
Okay #90 x 0 

## 2013-11-05 ENCOUNTER — Telehealth: Payer: Self-pay | Admitting: *Deleted

## 2013-11-05 ENCOUNTER — Encounter: Payer: Self-pay | Admitting: *Deleted

## 2013-11-05 MED ORDER — GLIPIZIDE 5 MG PO TABS: 5.0000 mg | ORAL_TABLET | Freq: Two times a day (BID) | ORAL | Status: DC

## 2013-11-05 MED ORDER — LOSARTAN POTASSIUM 25 MG PO TABS: 25.0000 mg | ORAL_TABLET | Freq: Every day | ORAL | Status: DC

## 2013-11-05 NOTE — Telephone Encounter (Signed)
Spoke with patient and advised results rx sent to pharmacy by e-script letter mailed to patients home address with results.

## 2013-11-05 NOTE — Telephone Encounter (Signed)
Message copied by Despina Hidden on Tue Nov 05, 2013  3:40 PM ------      Message from: Viviana Simpler I      Created: Mon Nov 04, 2013  1:43 PM       Please call her      Her diabetes control is not any better      Please have her increase the glipizide to 17m bid before breakfast and supper. (new Rx for 1 year)      Cholesterol levels are okay      Urine test shows early diabetic kidney problems----please have her start losartan 281mdaily (1 year Rx). Have her stop and let usKoreanow if she has any problems with this.      The rest of the labs were okay---just several minor abnormalities that weren't worrisome            Set up repeat blood work in 3 months -- renal and A1c (250.42) ------

## 2013-11-11 ENCOUNTER — Other Ambulatory Visit: Payer: Self-pay | Admitting: Internal Medicine

## 2013-11-11 NOTE — Telephone Encounter (Signed)
08/13/13 

## 2013-11-11 NOTE — Telephone Encounter (Signed)
rx called into pharmacy

## 2013-11-11 NOTE — Telephone Encounter (Signed)
Okay #180 x 0

## 2013-12-03 ENCOUNTER — Other Ambulatory Visit: Payer: Self-pay | Admitting: Internal Medicine

## 2013-12-03 NOTE — Telephone Encounter (Signed)
rx called into pharmacy

## 2013-12-03 NOTE — Telephone Encounter (Signed)
Okay #90 x 0 

## 2013-12-03 NOTE — Telephone Encounter (Signed)
11/04/13 

## 2013-12-18 ENCOUNTER — Other Ambulatory Visit: Payer: Self-pay | Admitting: Internal Medicine

## 2013-12-31 ENCOUNTER — Other Ambulatory Visit: Payer: Self-pay | Admitting: Internal Medicine

## 2013-12-31 NOTE — Telephone Encounter (Signed)
12/03/13 

## 2013-12-31 NOTE — Telephone Encounter (Signed)
Okay #90 x 0 

## 2013-12-31 NOTE — Telephone Encounter (Signed)
rx sent to pharmacy by e-script  

## 2014-01-01 ENCOUNTER — Other Ambulatory Visit: Payer: Self-pay | Admitting: Internal Medicine

## 2014-01-13 ENCOUNTER — Other Ambulatory Visit: Payer: Self-pay | Admitting: Internal Medicine

## 2014-01-29 ENCOUNTER — Other Ambulatory Visit: Payer: Self-pay | Admitting: Internal Medicine

## 2014-01-29 NOTE — Telephone Encounter (Signed)
Approved: #90 x 0 

## 2014-01-29 NOTE — Telephone Encounter (Signed)
12/31/13 

## 2014-01-29 NOTE — Telephone Encounter (Signed)
Pt request cb for status of tramadol refill. Spoke with Baxter Flattery at pharmacy and med should be ready after 6 pm today. Pt notified.

## 2014-01-29 NOTE — Telephone Encounter (Signed)
rx called into pharmacy

## 2014-02-10 ENCOUNTER — Other Ambulatory Visit: Payer: Self-pay | Admitting: Internal Medicine

## 2014-02-10 NOTE — Telephone Encounter (Signed)
Approved: #[id number] x 0

## 2014-02-10 NOTE — Telephone Encounter (Signed)
Medication phoned to pharmacy.  

## 2014-02-10 NOTE — Telephone Encounter (Signed)
11/11/13 

## 2014-02-26 ENCOUNTER — Other Ambulatory Visit: Payer: Self-pay | Admitting: Internal Medicine

## 2014-02-26 NOTE — Telephone Encounter (Signed)
01/29/14 

## 2014-02-26 NOTE — Telephone Encounter (Signed)
Approved: #90 x 0 

## 2014-02-26 NOTE — Telephone Encounter (Signed)
rx called into pharmacy

## 2014-03-10 ENCOUNTER — Other Ambulatory Visit: Payer: Self-pay | Admitting: Internal Medicine

## 2014-03-12 ENCOUNTER — Telehealth: Payer: Self-pay | Admitting: Internal Medicine

## 2014-03-13 NOTE — Telephone Encounter (Signed)
Pt was told by [facility] that pt cannot get metformin filled until 03/17/14 and pt is out of med; med list has pt taking metformin 500 mg, 2 tabs at breakfast; pt said in 10/2013 when increased glipizide if pts BS is running high at supper (275 or higher) pt will take 2 extra metformin; pt has not done this a lot but occasionally.pt wants to know if Dr Silvio Pate is OK with pt taking extra metformin or not. Advised pt to take metformin 500 mg 2 tabs at breakfast as prescribed until receives cb from Dr Alla German CMA;which will be next week. Spoke with Parsons at [facility] and he will give pt enough med to last until can get rx on 03/17/14. Pt voiced understanding and will pick up med at [facility] and cb if needed.

## 2014-03-14 NOTE — Telephone Encounter (Signed)
If she can tolerate it, I would like her to take 2041m of metformin (4 tabs) daily She can take this all at breakfast, or split the dose as she has been doing Okay to update Rx and send for a year

## 2014-03-17 MED ORDER — METFORMIN HCL ER 500 MG PO TB24
2000.0000 mg | ORAL_TABLET | Freq: Every day | ORAL | Status: DC
Start: 2014-03-17 — End: 2015-02-18

## 2014-03-17 NOTE — Addendum Note (Signed)
Addended by: Despina Hidden on: 03/17/2014 10:16 AM   Modules accepted: Orders, Medications

## 2014-03-17 NOTE — Telephone Encounter (Signed)
Spoke with patient and advised results rx sent to pharmacy by e-script  

## 2014-03-26 ENCOUNTER — Other Ambulatory Visit: Payer: Self-pay | Admitting: Internal Medicine

## 2014-03-26 NOTE — Telephone Encounter (Signed)
Approved: #90 x 0 

## 2014-03-26 NOTE — Telephone Encounter (Signed)
rx called into pharmacy

## 2014-03-26 NOTE — Telephone Encounter (Signed)
02/26/14 

## 2014-04-03 ENCOUNTER — Ambulatory Visit (INDEPENDENT_AMBULATORY_CARE_PROVIDER_SITE_OTHER): Payer: Medicare PPO | Admitting: Internal Medicine

## 2014-04-03 ENCOUNTER — Encounter: Payer: Self-pay | Admitting: Internal Medicine

## 2014-04-03 ENCOUNTER — Ambulatory Visit (INDEPENDENT_AMBULATORY_CARE_PROVIDER_SITE_OTHER)
Admission: RE | Admit: 2014-04-03 | Discharge: 2014-04-03 | Disposition: A | Discharge status: Home / Self Care | Payer: Medicare PPO | Source: Ambulatory Visit | Attending: Internal Medicine | Admitting: Internal Medicine

## 2014-04-03 VITALS — BP 152/82 | HR 72 | Temp 99.0°F | Resp 24 | Ht 64.0 in | Wt 171.0 lb

## 2014-04-03 DIAGNOSIS — J181 Lobar pneumonia, unspecified organism: Secondary | ICD-10-CM

## 2014-04-03 DIAGNOSIS — R042 Hemoptysis: Secondary | ICD-10-CM

## 2014-04-03 DIAGNOSIS — J189 Pneumonia, unspecified organism: Secondary | ICD-10-CM

## 2014-04-03 MED ORDER — CEFTRIAXONE SODIUM 1 G IJ SOLR
1.0000 g | Freq: Once | INTRAMUSCULAR | Status: AC
  Administered 2014-04-03: 1 g via INTRAMUSCULAR

## 2014-04-03 MED ORDER — LEVOFLOXACIN 500 MG PO TABS
500.0000 mg | ORAL_TABLET | Freq: Every day | ORAL | Status: DC
Start: 2014-04-03 — End: 2014-05-06

## 2014-04-03 NOTE — Progress Notes (Signed)
Subjective:    Patient ID: Jaclyn Peterson, female    DOB: 1940/07/22, 74 y.o.   MRN: [id number]  HPI Here due to respiratory infection  Started after flu shot a couple of weeks ago---the high dose one Got achy--in bones Cough started shortly after Had mucus with blood yesterday-- single episode?  White patches on tongue which hurt Head congestion and drainage--then hacks it up  Some fever--high 101.4 Chills and night sweat No SOB--or maybe some this AM  No ear pain Does have sore throat  Has tried tylenol PM for night mucinex for cough---does help some  Current Outpatient Prescriptions on File Prior to Visit  Medication Sig Dispense Refill  . amLODipine (NORVASC) 10 MG tablet TAKE 1 TABLET BY MOUTH EVERY DAY 90 tablet 3  . aspirin 81 MG tablet Take 81 mg by mouth daily.      . Calcium Carb-Cholecalciferol (OS-CAL 500 + D) 500-600 MG-UNIT TABS Take by mouth daily.      . cholestyramine (QUESTRAN) 4 G packet MIX ONE PACKET DAILY 60 each 11  . diazepam (VALIUM) 2 MG tablet TAKE 1 TABLET BY MOUTH TWICE DAILY 180 tablet 0  . DULoxetine (CYMBALTA) 60 MG capsule TAKE 1 CAPSULE BY MOUTH TWICE DAILY 180 capsule 3  . FLUoxetine (PROZAC) 40 MG capsule TAKE ONE CAPSULE BY MOUTH EVERY DAY 90 capsule 0  . glipiZIDE (GLUCOTROL) 5 MG tablet Take 1 tablet (5 mg total) by mouth 2 (two) times daily before a meal. 180 tablet 3  . losartan (COZAAR) 25 MG tablet Take 1 tablet (25 mg total) by mouth daily. 90 tablet 3  . metFORMIN (GLUCOPHAGE-XR) 500 MG 24 hr tablet Take 4 tablets (2,000 mg total) by mouth daily with breakfast. 120 tablet 11  . propranolol (INDERAL) 20 MG tablet TAKE 1 TABLET BY MOUTH TWICE DAILY 180 tablet 1  . senna-docusate (SENOKOT-S) 8.6-50 MG per tablet Take 1 tablet by mouth 2 (two) times daily.      . simvastatin (ZOCOR) 20 MG tablet Take 1 tablet (20 mg total) by mouth daily. 90 tablet 3  . tiZANidine (ZANAFLEX) 2 MG tablet TAKE 1 TABLET BY MOUTH TWICE DAILY AS  NEEDED FOR MUSCLE SPASM 180 tablet 0  . traMADol (ULTRAM) 50 MG tablet TAKE 1 TABLET BY MOUTH THREE TIMES DAILY AS NEEDED 90 tablet 0  . TRUETEST TEST test strip TEST  BLOOD  SUGAR ONE TIME DAILY AS DIRECTED 100 each 3   No current facility-administered medications on file prior to visit.    Allergies  Allergen Reactions  . Bisoprolol Fumarate     REACTION: ha  . Chlorthalidone   . Lisinopril     REACTION: throat symptoms  . Sulfa Antibiotics     Told by mother as a child  . Tetracyclines & Related Swelling    Throat swelling    Past Medical History  Diagnosis Date  . Depression   . Diabetes mellitus   . Hyperlipidemia   . Hypertension   . Osteoporosis   . 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 Chronic pain   . SBO (small bowel obstruction)     obstipation  10/2005  . Falls frequently     Falls/pneumonia 1/12    Past Surgical History  Procedure Laterality Date  . Appendectomy      1946  Adhesions shortly after  . Abdominal hysterectomy      BSO 1985  cancer  . Kidney stone surgery  cysto-- Aline Brochure 2003  . Bladder repair      bladder tacking- chronic pain since  1998  . Back surgery  1970's  . Cholecystectomy      lysis of adhesions--post op complications, trach, etc  8/10  . Cataract extraction  12/12    left and then laser---Dr Tobe Sos    Family History  Problem Relation Age of Onset  . Coronary artery disease Father   . Hypertension Neg Hx   . Diabetes Neg Hx     History   Social History  . Marital Status: Married    Spouse Name: N/A    Number of Children: 2  . Years of Education: N/A   Occupational History  . 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 Smoking status: Current Some Day Smoker  . Smokeless tobacco: Never Used     Comment: only once in a while. Gave info on 1-800-QUIT NOW  . Alcohol Use: No  . Drug Use: No  . Sexual Activity: Not on file   Other Topics Concern  . Not on file   Social  History Narrative   Enjoys crafts      No living will   Daughter, SIL, kids living with her know   Daughter, [name], should make health care decisions if POA needed   Would accept resuscitation attempts but no prolonged artificial ventilation   No feeding tubes if cognitively unaware            Review of Systems  No rash No vomiting Some loose stools---not unusual for her Appetite is off     Objective:   Physical Exam  Constitutional: She appears well-developed and well-nourished. No distress.  HENT:  No sinus tenderness TMs normal Moderate nasal inflammation but no clear bleeding spots Slight pharyngeal injection Dry tongue---several white areas though not clearly thrush  Neck: Normal range of motion. Neck supple.  Pulmonary/Chest: Effort normal and breath sounds normal. No respiratory distress. She has no wheezes. She has no rales.  Lymphadenopathy:    She has no cervical adenopathy.          Assessment & Plan:

## 2014-04-03 NOTE — Progress Notes (Signed)
Pre visit review using our clinic review tool, if applicable. No additional management support is needed unless otherwise documented below in the visit note. 

## 2014-04-03 NOTE — Assessment & Plan Note (Signed)
Probably from nose but fever and chills dictate checking CXR

## 2014-04-03 NOTE — Assessment & Plan Note (Signed)
CXR shows infiltrate ---looks like LLL Will treat with rocephin Then levaquin

## 2014-04-03 NOTE — Addendum Note (Signed)
Addended by: Despina Hidden on: 04/03/2014 05:23 PM   Modules accepted: Orders

## 2014-04-03 NOTE — Patient Instructions (Signed)

## 2014-04-11 ENCOUNTER — Other Ambulatory Visit: Payer: Self-pay | Admitting: Internal Medicine

## 2014-04-16 ENCOUNTER — Other Ambulatory Visit: Payer: Self-pay | Admitting: Internal Medicine

## 2014-04-24 ENCOUNTER — Other Ambulatory Visit: Payer: Self-pay | Admitting: Internal Medicine

## 2014-04-24 NOTE — Telephone Encounter (Signed)
Approved: #90 x 0 

## 2014-04-24 NOTE — Telephone Encounter (Signed)
Patient called to find out if her prescription was called in to the pharmacy.

## 2014-04-24 NOTE — Telephone Encounter (Signed)
Last f/u appt 10/2013 with upcoming 04/2014 appt

## 2014-04-24 NOTE — Telephone Encounter (Signed)
Called in rx and called patient to let her know, [facility] to [facility].

## 2014-05-06 ENCOUNTER — Encounter: Payer: Self-pay | Admitting: Internal Medicine

## 2014-05-06 ENCOUNTER — Ambulatory Visit (INDEPENDENT_AMBULATORY_CARE_PROVIDER_SITE_OTHER): Payer: Medicare PPO | Admitting: Internal Medicine

## 2014-05-06 ENCOUNTER — Ambulatory Visit (INDEPENDENT_AMBULATORY_CARE_PROVIDER_SITE_OTHER)
Admission: RE | Admit: 2014-05-06 | Discharge: 2014-05-06 | Disposition: A | Discharge status: Home / Self Care | Payer: Medicare PPO | Source: Ambulatory Visit | Attending: Internal Medicine | Admitting: Internal Medicine

## 2014-05-06 VITALS — BP 148/62 | HR 64 | Temp 98.6°F | Wt 164.8 lb

## 2014-05-06 DIAGNOSIS — E1142 Type 2 diabetes mellitus with diabetic polyneuropathy: Secondary | ICD-10-CM

## 2014-05-06 DIAGNOSIS — J189 Pneumonia, unspecified organism: Secondary | ICD-10-CM

## 2014-05-06 DIAGNOSIS — J181 Lobar pneumonia, unspecified organism: Principal | ICD-10-CM

## 2014-05-06 DIAGNOSIS — E1141 Type 2 diabetes mellitus with diabetic mononeuropathy: Secondary | ICD-10-CM

## 2014-05-06 DIAGNOSIS — E1149 Type 2 diabetes mellitus with other diabetic neurological complication: Secondary | ICD-10-CM

## 2014-05-06 DIAGNOSIS — Z23 Encounter for immunization: Secondary | ICD-10-CM

## 2014-05-06 DIAGNOSIS — I10 Essential (primary) hypertension: Secondary | ICD-10-CM

## 2014-05-06 DIAGNOSIS — IMO0002 Reserved for concepts with insufficient information to code with codable children: Secondary | ICD-10-CM

## 2014-05-06 DIAGNOSIS — E1165 Type 2 diabetes mellitus with hyperglycemia: Secondary | ICD-10-CM

## 2014-05-06 DIAGNOSIS — S40012A Contusion of left shoulder, initial encounter: Secondary | ICD-10-CM | POA: Insufficient documentation

## 2014-05-06 LAB — HEMOGLOBIN A1C: Hgb A1c MFr Bld: 7.8 % — ABNORMAL HIGH (ref 4.6–6.5)

## 2014-05-06 NOTE — Progress Notes (Signed)
Subjective:    Patient ID: Jaclyn Peterson, female    DOB: Aug 28, 1940, 74 y.o.   MRN: [id number]  HPI Here for follow up of pneumonia and diabetes  Does feel that she got over the pneumonia No fever No cough now Breathing is back to normal  Did have fall the day she was here [place] in bedroom onto left shoulder Still with pain and trouble moving it Bothers her at night Taking tylenol and tramadol for this  Is trying to be more careful with diet Has lost some weight since last visit Usually 110 sugars in AM. May be up over 200 in afternoon Checks once a day but varies the times. Rarely will check--like if mild low sugar reaction (which does happen rarely) Still with numbness and pain in feet-- has bee tolerable  No chest pain No palpitations  No dizziness or syncope  Current Outpatient Prescriptions on File Prior to Visit  Medication Sig Dispense Refill  . amLODipine (NORVASC) 10 MG tablet TAKE 1 TABLET BY MOUTH EVERY DAY 90 tablet 3  . aspirin 81 MG tablet Take 81 mg by mouth daily.      . benzonatate (TESSALON) 200 MG capsule TAKE 1 CAPSULE BY MOUTH THREE TIMES DAILY AS NEEDED. 60 capsule 0  . Calcium Carb-Cholecalciferol (OS-CAL 500 + D) 500-600 MG-UNIT TABS Take by mouth daily.      . cholestyramine (QUESTRAN) 4 G packet MIX ONE PACKET DAILY 60 each 11  . diazepam (VALIUM) 2 MG tablet TAKE 1 TABLET BY MOUTH TWICE DAILY 180 tablet 0  . DULoxetine (CYMBALTA) 60 MG capsule TAKE 1 CAPSULE BY MOUTH TWICE DAILY 180 capsule 3  . FLUoxetine (PROZAC) 40 MG capsule TAKE ONE CAPSULE BY MOUTH EVERY DAY 90 capsule 0  . glipiZIDE (GLUCOTROL) 5 MG tablet Take 1 tablet (5 mg total) by mouth 2 (two) times daily before a meal. 180 tablet 3  . losartan (COZAAR) 25 MG tablet Take 1 tablet (25 mg total) by mouth daily. 90 tablet 3  . metFORMIN (GLUCOPHAGE-XR) 500 MG 24 hr tablet Take 4 tablets (2,000 mg total) by mouth daily with breakfast. 120 tablet 11  . propranolol (INDERAL) 20 MG  tablet TAKE 1 TABLET BY MOUTH TWICE DAILY 180 tablet 1  . senna-docusate (SENOKOT-S) 8.6-50 MG per tablet Take 1 tablet by mouth 2 (two) times daily.      . simvastatin (ZOCOR) 20 MG tablet Take 1 tablet (20 mg total) by mouth daily. 90 tablet 3  . tiZANidine (ZANAFLEX) 2 MG tablet TAKE 1 TABLET BY MOUTH TWICE DAILY AS NEEDED FOR MUSCLE SPASMS 180 tablet 0  . traMADol (ULTRAM) 50 MG tablet TAKE 1 TABLET BY MOUTH THREE TIMES DAILY AS NEEDED 90 tablet 0  . TRUETEST TEST test strip TEST  BLOOD  SUGAR ONE TIME DAILY AS DIRECTED 100 each 3   No current facility-administered medications on file prior to visit.    Allergies  Allergen Reactions  . Bisoprolol Fumarate     REACTION: ha  . Chlorthalidone   . Lisinopril     REACTION: throat symptoms  . Sulfa Antibiotics     Told by mother as a child  . Tetracyclines & Related Swelling    Throat swelling    Past Medical History  Diagnosis Date  . Depression   . Diabetes mellitus   . Hyperlipidemia   . Hypertension   . Osteoporosis   . 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 Chronic pain   .  SBO (small bowel obstruction)     obstipation  10/2005  . Falls frequently     Falls/pneumonia 1/12    Past Surgical History  Procedure Laterality Date  . Appendectomy      1946  Adhesions shortly after  . Abdominal hysterectomy      BSO 1985  cancer  . Kidney stone surgery      cysto-- [facility] 2003  . Bladder repair      bladder tacking- chronic pain since  1998  . Back surgery  1970's  . Cholecystectomy      lysis of adhesions--post op complications, trach, etc  8/10  . Cataract extraction  12/12    left and then laser---Dr Tobe Sos    Family History  Problem Relation Age of Onset  . Coronary artery disease Father   . Hypertension Neg Hx   . Diabetes Neg Hx     History   Social History  . Marital Status: Married    Spouse Name: N/A  . Number of Children: 2  . Years of Education: N/A   Occupational History  . 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 Smoking status: Current Some Day Smoker  . Smokeless tobacco: Never Used     Comment: only once in a while. Gave info on 1-800-QUIT NOW  . Alcohol Use: No  . Drug Use: No  . Sexual Activity: Not on file   Other Topics Concern  . Not on file   Social History Narrative   Enjoys crafts      No living will   Daughter, SIL, kids living with her know   Daughter, [name], should make health care decisions if POA needed   Would accept resuscitation attempts but no prolonged artificial ventilation   No feeding tubes if cognitively unaware            Review of Systems  Sleeps okay Mood has been fine--still has daughter/SIL and 2 grandkids living with her----this can be stressful     Objective:   Physical Exam  Constitutional: She appears well-developed and well-nourished. No distress.  Neck: Normal range of motion. Neck supple. No thyromegaly present.  Cardiovascular: Normal rate, regular rhythm, normal heart sounds and intact distal pulses.  Exam reveals no gallop.   No murmur heard. Pulmonary/Chest: Effort normal and breath sounds normal. No respiratory distress. She has no wheezes. She has no rales.  Musculoskeletal: She exhibits no edema or tenderness.  Left shoulder with crepitus Fairly full passive ROM and can abduct actively to 90 degrees  Lymphadenopathy:    She has no cervical adenopathy.  Neurological:  Decreased sensation in feet No lesions Slightly thickened toenails  Psychiatric: She has a normal mood and affect. Her behavior is normal.          Assessment & Plan:

## 2014-05-06 NOTE — Progress Notes (Signed)
Pre visit review using our clinic review tool, if applicable. No additional management support is needed unless otherwise documented below in the visit note. 

## 2014-05-06 NOTE — Assessment & Plan Note (Signed)
Weight is down but not exercising Discussed lifestyle If A1c over 9%, will need to take action

## 2014-05-06 NOTE — Assessment & Plan Note (Signed)
BP Readings from Last 3 Encounters:  05/06/14 148/62  04/03/14 152/82  11/01/13 140/68   Reasonable control for her age On ARB

## 2014-05-06 NOTE — Assessment & Plan Note (Signed)
Ongoing symptoms Not bad enough for meds at this point

## 2014-05-06 NOTE — Assessment & Plan Note (Signed)
Symptoms have resolved Will recheck CXR

## 2014-05-06 NOTE — Addendum Note (Signed)
Addended by: Emelia Salisbury C on: 05/06/2014 12:12 PM   Modules accepted: Orders

## 2014-05-06 NOTE — Assessment & Plan Note (Signed)
Clearly has underlying arthritis but no major findings now Reassured---will wait for further healing

## 2014-05-12 ENCOUNTER — Other Ambulatory Visit: Payer: Self-pay | Admitting: Internal Medicine

## 2014-05-12 NOTE — Telephone Encounter (Signed)
Electronic refill request for valium.  Patient last seen 05/06/2014.  Last filled 02/10/2014.  Please advise.

## 2014-05-12 NOTE — Telephone Encounter (Signed)
Valium rx called [facility] per Dr Silvio Pate approval.

## 2014-05-12 NOTE — Telephone Encounter (Signed)
Approved:  #[id number] x 0

## 2014-05-21 ENCOUNTER — Other Ambulatory Visit: Payer: Self-pay | Admitting: Internal Medicine

## 2014-05-21 NOTE — Telephone Encounter (Signed)
Last filled 04/24/14--please advise

## 2014-05-21 NOTE — Telephone Encounter (Signed)
Pt left v/m requesting cb about tramadol refill.Please advise.

## 2014-05-21 NOTE — Telephone Encounter (Signed)
Pt called back again and was advised can get tramadol refilled on or after 05/23/14; pt said she is out of med and since she fell and hurt her shoulder; a few times pt took more than 3 x a day. Pt wants to know if any way med can be refilled today. Please advise.

## 2014-05-21 NOTE — Telephone Encounter (Signed)
Ok to phone in tramadol to be filled on or after 05/23/14

## 2014-05-21 NOTE — Telephone Encounter (Signed)
Med can be phoned in tomorrow. She should not be taking more than prescribed

## 2014-05-22 NOTE — Telephone Encounter (Signed)
Rx called in to pharmacy. 

## 2014-05-28 ENCOUNTER — Other Ambulatory Visit: Payer: Self-pay | Admitting: Internal Medicine

## 2014-06-08 ENCOUNTER — Other Ambulatory Visit: Payer: Self-pay | Admitting: Internal Medicine

## 2014-06-19 ENCOUNTER — Other Ambulatory Visit: Payer: Self-pay | Admitting: Internal Medicine

## 2014-06-19 NOTE — Telephone Encounter (Signed)
rx called into pharmacy

## 2014-06-19 NOTE — Telephone Encounter (Signed)
Last filled 05/21/2014.Marland Kitchen#[phone]--please advise

## 2014-06-19 NOTE — Telephone Encounter (Signed)
Approved: #90 x 0 

## 2014-06-26 ENCOUNTER — Encounter: Payer: Self-pay | Admitting: Family Medicine

## 2014-06-26 ENCOUNTER — Ambulatory Visit (INDEPENDENT_AMBULATORY_CARE_PROVIDER_SITE_OTHER): Payer: Medicare PPO | Admitting: Family Medicine

## 2014-06-26 ENCOUNTER — Ambulatory Visit (INDEPENDENT_AMBULATORY_CARE_PROVIDER_SITE_OTHER)
Admission: RE | Admit: 2014-06-26 | Discharge: 2014-06-26 | Disposition: A | Discharge status: Home / Self Care | Payer: Medicare PPO | Source: Ambulatory Visit | Attending: Family Medicine | Admitting: Family Medicine

## 2014-06-26 ENCOUNTER — Ambulatory Visit: Payer: Medicare PPO | Admitting: Family Medicine

## 2014-06-26 VITALS — BP 144/50 | HR 74 | Temp 98.4°F | Ht 64.0 in | Wt 165.5 lb

## 2014-06-26 DIAGNOSIS — M25512 Pain in left shoulder: Secondary | ICD-10-CM

## 2014-06-26 DIAGNOSIS — M7502 Adhesive capsulitis of left shoulder: Secondary | ICD-10-CM | POA: Diagnosis not present

## 2014-06-26 MED ORDER — BENZONATATE 200 MG PO CAPS: ORAL_CAPSULE | ORAL | Status: DC

## 2014-06-26 NOTE — Progress Notes (Signed)
Pre visit review using our clinic review tool, if applicable. No additional management support is needed unless otherwise documented below in the visit note. 

## 2014-06-26 NOTE — Progress Notes (Signed)
Dr. Frederico Hamman T. Xzavian Semmel, MD, [facility] [place], [address] Phone: [phone] Fax: [phone]  06/26/2014  Patient: Jaclyn Peterson, MRN: [id number], DOB: 1940/03/19, 74 y.o.  Primary Physician:  Viviana Simpler, MD  Chief Complaint: Shoulder Pain  Subjective:   Jaclyn Peterson is a 74 y.o. very pleasant female patient who presents with the following: shoulder pain  The patient noted above presents with shoulder pain that has been ongoing for 2 mo Fell on shoulder x 2 in 03/2014. The patient denies neck pain or radicular symptoms. No shoulder blade pain Denies dislocation, subluxation, separation of the shoulder. The patient does complain of pain with flexion, abduction, and terminal motion.  Significant restriction of motion. she describes a deep ache around the shoulder, and sometimes it will wake the patient up at night.  Had pneumonia and x-ray ok. Landed on Left shoulder. 03/2014, dx pneumonia. Now cannot sleep at night will ache and not.   Medications Tried: Ultram, nsaids, tylenol Ice or Heat: minimal help Tried PT: No  Prior shoulder Injury: No Prior surgery: No Prior fracture: No  Past Medical History, Surgical History, Social History, Family History, Medications, and allergies reviewed and updated if relevant.   GEN: No fevers, chills. Nontoxic. Primarily MSK c/o today. MSK: Detailed in the HPI GI: tolerating PO intake without difficulty Neuro: No numbness, parasthesias, or tingling associated. Otherwise the pertinent positives of the ROS are noted above.    Objective:   Blood pressure 144/50, pulse 74, temperature 98.4 F (36.9 C), temperature source Oral, height 5' 4"  (1.626 m), weight 165 lb 8 oz (75.07 kg), SpO2 97 %.  GEN: Well-developed,well-nourished,in no acute distress; alert,appropriate and cooperative throughout examination HEENT: Normocephalic and atraumatic without  obvious abnormalities. Ears, externally no deformities PULM: Breathing comfortably in no respiratory distress EXT: No clubbing, cyanosis, or edema PSYCH: Normally interactive. Cooperative during the interview. Pleasant. Friendly and conversant. Not anxious or depressed appearing. Normal, full affect.  Shoulder: L Inspection: No muscle wasting or winging Ecchymosis/edema: neg  AC joint, scapula, clavicle: NT Cervical spine: NT, full ROM Spurling's: neg Abduction: 4/5, limited to 90 Flexion: 5/5, limited to 90 IR, full, lift-off: 5/5, none ER at neutral: 5/5, cannot assess AC crossover and compression: unable to complete Additional special testing is equivocal given lack of motion C5-T1 intact Sensation intact Grip 5/5  Assessment and Plan:   Frozen shoulder, left  Left shoulder pain - Plan: DG Shoulder Left  >25 minutes spent in face to face time with patient, >50% spent in counselling or coordination of care  Given history of fall, cannot exclude occult RTC tear. No evidence of prior fx on xr.  Patient was given a systematic ROM protocol from [facility]. Emphasized importance of adherence, help of PT, daily HEP.  The average length of total symptoms is 12 months going through 3 different phases in the freezing and thawing process. Reviewed all with patient.   Tylenol or NSAID of choice prn for pain relief Intraarticular shoulder injections discussed with patient, which have good evidence for accelerating the thawing phase, declined for now.  Patient will be sent for formal PT for aggressive frozen shoulder ROM, patient declined. Will need RTC str and scapular stabilization to fix underlying mechanics.  Follow-up: Return in about 2 months (around 08/26/2014).  New Prescriptions   No medications on file   Orders Placed This Encounter  Procedures  . DG Shoulder Left    Signed,  Blessyn Sommerville T. Rhen Dossantos, MD   Patient's Medications  New Prescriptions   No medications on file   Previous Medications   AMLODIPINE (NORVASC) 10 MG TABLET    TAKE 1 TABLET BY MOUTH EVERY DAY   ASPIRIN 81 MG TABLET    Take 81 mg by mouth daily.     CALCIUM CARB-CHOLECALCIFEROL (OS-CAL 500 + D) 500-600 MG-UNIT TABS    Take by mouth daily.     CHOLESTYRAMINE (QUESTRAN) 4 G PACKET    MIX ONE PACKET DAILY   DIAZEPAM (VALIUM) 2 MG TABLET    TAKE 1 TABLET BY MOUTH TWICE DAILY   DULOXETINE (CYMBALTA) 60 MG CAPSULE    TAKE 1 CAPSULE BY MOUTH TWICE DAILY   FLUOXETINE (PROZAC) 40 MG CAPSULE    TAKE 1 CAPSULE BY MOUTH EVERY DAY   GLIPIZIDE (GLUCOTROL) 5 MG TABLET    Take 1 tablet (5 mg total) by mouth 2 (two) times daily before a meal.   LOSARTAN (COZAAR) 25 MG TABLET    Take 1 tablet (25 mg total) by mouth daily.   METFORMIN (GLUCOPHAGE-XR) 500 MG 24 HR TABLET    Take 4 tablets (2,000 mg total) by mouth daily with breakfast.   PROPRANOLOL (INDERAL) 20 MG TABLET    TAKE 1 TABLET BY MOUTH TWICE DAILY   SENNA-DOCUSATE (SENOKOT-S) 8.6-50 MG PER TABLET    Take 1 tablet by mouth 2 (two) times daily.     SIMVASTATIN (ZOCOR) 20 MG TABLET    Take 1 tablet (20 mg total) by mouth daily.   TIZANIDINE (ZANAFLEX) 2 MG TABLET    TAKE 1 TABLET BY MOUTH TWICE DAILY AS NEEDED FOR MUSCLE SPASMS   TRAMADOL (ULTRAM) 50 MG TABLET    TAKE 1 TABLET BY MOUTH THREE TIMES DAILY AS NEEDED   TRUETEST TEST TEST STRIP    TEST  BLOOD  SUGAR ONE TIME DAILY AS DIRECTED  Modified Medications   Modified Medication Previous Medication   BENZONATATE (TESSALON) 200 MG CAPSULE benzonatate (TESSALON) 200 MG capsule      TAKE 1 CAPSULE BY MOUTH THREE TIMES DAILY AS NEEDED.    TAKE 1 CAPSULE BY MOUTH THREE TIMES DAILY AS NEEDED.  Discontinued Medications   No medications on file

## 2014-06-27 ENCOUNTER — Other Ambulatory Visit: Payer: Self-pay | Admitting: Internal Medicine

## 2014-07-08 ENCOUNTER — Other Ambulatory Visit: Payer: Self-pay | Admitting: Internal Medicine

## 2014-07-16 ENCOUNTER — Other Ambulatory Visit: Payer: Self-pay | Admitting: Internal Medicine

## 2014-07-16 NOTE — Telephone Encounter (Signed)
rx called into pharmacy

## 2014-07-16 NOTE — Telephone Encounter (Signed)
06/19/14 

## 2014-07-16 NOTE — Telephone Encounter (Signed)
Approved: #90 x 0 

## 2014-08-07 ENCOUNTER — Other Ambulatory Visit: Payer: Self-pay | Admitting: Internal Medicine

## 2014-08-07 NOTE — Telephone Encounter (Signed)
Approved: #[id number] x 0

## 2014-08-07 NOTE — Telephone Encounter (Signed)
Rx called to pharmacy

## 2014-08-07 NOTE — Telephone Encounter (Signed)
Last filled #180 05/12/14.

## 2014-08-12 ENCOUNTER — Other Ambulatory Visit: Payer: Self-pay | Admitting: Internal Medicine

## 2014-08-13 NOTE — Telephone Encounter (Signed)
07/16/14 

## 2014-08-13 NOTE — Telephone Encounter (Signed)
rx called into pharmacy

## 2014-08-13 NOTE — Telephone Encounter (Signed)
Approved: #90 x 0 

## 2014-08-27 ENCOUNTER — Ambulatory Visit: Payer: Medicare PPO | Admitting: Internal Medicine

## 2014-09-08 ENCOUNTER — Other Ambulatory Visit: Payer: Self-pay | Admitting: Internal Medicine

## 2014-09-08 NOTE — Telephone Encounter (Signed)
rx called into pharmacy

## 2014-09-08 NOTE — Telephone Encounter (Signed)
Approved:she is regularly a few days early Can fill #90 x 0 but she won't be due again till ~ August 1

## 2014-09-08 NOTE — Telephone Encounter (Signed)
08/13/14 

## 2014-10-06 ENCOUNTER — Other Ambulatory Visit: Payer: Self-pay | Admitting: Internal Medicine

## 2014-10-06 NOTE — Telephone Encounter (Signed)
Last filled 09/08/14 LETVAK PATIENT, Please send back to me for call in

## 2014-10-06 NOTE — Telephone Encounter (Signed)
rx called into pharmacy

## 2014-10-06 NOTE — Telephone Encounter (Signed)
Px written for call in   

## 2014-10-07 ENCOUNTER — Other Ambulatory Visit: Payer: Self-pay | Admitting: Internal Medicine

## 2014-10-14 ENCOUNTER — Other Ambulatory Visit: Payer: Self-pay | Admitting: Internal Medicine

## 2014-10-20 ENCOUNTER — Other Ambulatory Visit: Payer: Self-pay | Admitting: Family Medicine

## 2014-10-20 NOTE — Telephone Encounter (Signed)
Ok to fill? Last filled 06/2014

## 2014-10-20 NOTE — Telephone Encounter (Signed)
Approved:okay #60 x 0 

## 2014-10-20 NOTE — Telephone Encounter (Signed)
rx sent to pharmacy by e-script  

## 2014-11-04 ENCOUNTER — Other Ambulatory Visit: Payer: Self-pay | Admitting: Internal Medicine

## 2014-11-04 ENCOUNTER — Other Ambulatory Visit: Payer: Self-pay | Admitting: Family Medicine

## 2014-11-04 ENCOUNTER — Telehealth: Payer: Self-pay | Admitting: Family Medicine

## 2014-11-04 ENCOUNTER — Ambulatory Visit: Payer: Medicare PPO | Admitting: Internal Medicine

## 2014-11-04 NOTE — Telephone Encounter (Signed)
10/06/14 

## 2014-11-04 NOTE — Telephone Encounter (Signed)
rx called into pharmacy

## 2014-11-04 NOTE — Telephone Encounter (Signed)
pt left v/m requesting status of tramadol refill; left v/m requesting pt to cb.

## 2014-11-04 NOTE — Telephone Encounter (Signed)
08/07/2014 

## 2014-11-04 NOTE — Telephone Encounter (Signed)
Approved: okay #90 x 0 Have her set up Medicare wellness in the next few months

## 2014-11-04 NOTE — Telephone Encounter (Signed)
Approved: okay #180 x 0 Needs Medicare wellness soon

## 2014-11-12 NOTE — Telephone Encounter (Signed)
Pt has already picked up med from pharmacy; nothing further needed.

## 2014-11-14 ENCOUNTER — Other Ambulatory Visit: Payer: Self-pay | Admitting: Internal Medicine

## 2014-11-20 ENCOUNTER — Other Ambulatory Visit: Payer: Self-pay | Admitting: Internal Medicine

## 2014-12-02 ENCOUNTER — Other Ambulatory Visit: Payer: Self-pay | Admitting: Internal Medicine

## 2014-12-02 NOTE — Telephone Encounter (Signed)
11/04/2014 

## 2014-12-02 NOTE — Telephone Encounter (Signed)
Pt request status of tramadol refill to [facility] [address]; spoke with Ronalee Belts at [facility] and med will be ready in about 1 hr. Pt voiced understanding.

## 2014-12-02 NOTE — Telephone Encounter (Signed)
rx called into pharmacy

## 2014-12-02 NOTE — Telephone Encounter (Signed)
Approved: okay #90 x 0 

## 2014-12-29 ENCOUNTER — Other Ambulatory Visit: Payer: Self-pay | Admitting: Internal Medicine

## 2014-12-29 NOTE — Telephone Encounter (Signed)
rx called into pharmacy

## 2014-12-29 NOTE — Telephone Encounter (Signed)
12/02/2014 LETVAK PATIENT, Please send back to me for call in

## 2014-12-29 NOTE — Telephone Encounter (Signed)
Px written for call in   

## 2014-12-29 NOTE — Telephone Encounter (Signed)
Pt called to ck on status of tramadol refill; advised called to [facility] [address] this afternoon; pt will ck with pharmacy.

## 2015-01-03 ENCOUNTER — Other Ambulatory Visit: Payer: Self-pay | Admitting: Internal Medicine

## 2015-01-08 ENCOUNTER — Other Ambulatory Visit: Payer: Self-pay | Admitting: Internal Medicine

## 2015-01-25 ENCOUNTER — Other Ambulatory Visit: Payer: Self-pay | Admitting: Family Medicine

## 2015-01-26 NOTE — Telephone Encounter (Signed)
Approved: okay tramadol #90 x 0 Don't know why it was listed twice

## 2015-01-26 NOTE — Telephone Encounter (Signed)
12/29/2014 

## 2015-01-26 NOTE — Telephone Encounter (Signed)
rx called into pharmacy

## 2015-02-01 ENCOUNTER — Other Ambulatory Visit: Payer: Self-pay | Admitting: Internal Medicine

## 2015-02-02 NOTE — Telephone Encounter (Signed)
Approved: #60 x 0 only--- she canceled last 2 appts and is overdue for follow up

## 2015-02-02 NOTE — Telephone Encounter (Signed)
11/04/2014 

## 2015-02-02 NOTE — Telephone Encounter (Signed)
rx called into pharmacy Spoke with patient and advised results appt scheduled 02/10/2015

## 2015-02-10 ENCOUNTER — Encounter: Payer: Medicare PPO | Admitting: Internal Medicine

## 2015-02-12 ENCOUNTER — Other Ambulatory Visit: Payer: Self-pay | Admitting: Internal Medicine

## 2015-02-18 ENCOUNTER — Other Ambulatory Visit: Payer: Self-pay | Admitting: Internal Medicine

## 2015-02-23 ENCOUNTER — Other Ambulatory Visit: Payer: Self-pay | Admitting: Internal Medicine

## 2015-02-23 NOTE — Telephone Encounter (Signed)
Approved: #90 x 0 

## 2015-02-23 NOTE — Telephone Encounter (Signed)
01/26/15  

## 2015-02-23 NOTE — Telephone Encounter (Signed)
rx called into pharmacy

## 2015-02-26 ENCOUNTER — Ambulatory Visit (INDEPENDENT_AMBULATORY_CARE_PROVIDER_SITE_OTHER): Payer: Medicare PPO | Admitting: Internal Medicine

## 2015-02-26 ENCOUNTER — Encounter: Payer: Self-pay | Admitting: Internal Medicine

## 2015-02-26 VITALS — BP 132/80 | HR 75 | Temp 97.9°F | Ht 64.0 in | Wt 169.0 lb

## 2015-02-26 DIAGNOSIS — Z0000 Encounter for general adult medical examination without abnormal findings: Secondary | ICD-10-CM

## 2015-02-26 DIAGNOSIS — IMO0002 Reserved for concepts with insufficient information to code with codable children: Secondary | ICD-10-CM

## 2015-02-26 DIAGNOSIS — I10 Essential (primary) hypertension: Secondary | ICD-10-CM

## 2015-02-26 DIAGNOSIS — E1140 Type 2 diabetes mellitus with diabetic neuropathy, unspecified: Secondary | ICD-10-CM

## 2015-02-26 DIAGNOSIS — Z7189 Other specified counseling: Secondary | ICD-10-CM

## 2015-02-26 DIAGNOSIS — E1165 Type 2 diabetes mellitus with hyperglycemia: Secondary | ICD-10-CM

## 2015-02-26 DIAGNOSIS — F39 Unspecified mood [affective] disorder: Secondary | ICD-10-CM | POA: Diagnosis not present

## 2015-02-26 DIAGNOSIS — Z23 Encounter for immunization: Secondary | ICD-10-CM

## 2015-02-26 LAB — HM DIABETES FOOT EXAM

## 2015-02-26 NOTE — Assessment & Plan Note (Signed)
I have personally reviewed the Medicare Annual Wellness questionnaire and have noted 1. The patient's medical and social history 2. Their use of alcohol, tobacco or illicit drugs 3. Their current medications and supplements 4. The patient's functional ability including ADL's, fall risks, home safety risks and hearing or visual             impairment. 5. Diet and physical activities 6. Evidence for depression or mood disorders  The patients weight, height, BMI and visual acuity have been recorded in the chart I have made referrals, counseling and provided education to the patient based review of the above and I have provided the pt with a written personalized care plan for preventive services.  I have provided you with a copy of your personalized plan for preventive services. Please take the time to review along with your updated medication list.  Last colon 2010--doesn't want any others mammo due next year Flu vaccine today

## 2015-02-26 NOTE — Assessment & Plan Note (Addendum)
Was some better last time--hopefully still okay Duloxetine helps neuropathic pain Uses cane for balance

## 2015-02-26 NOTE — Assessment & Plan Note (Signed)
See social history Blank forms done

## 2015-02-26 NOTE — Patient Instructions (Signed)
Please set up your diabetes eye exam.

## 2015-02-26 NOTE — Progress Notes (Signed)
Subjective:    Patient ID: Jaclyn Peterson, female    DOB: 19-Oct-1940, 74 y.o.   MRN: [id number]  HPI Here for Medicare wellness and follow up of chronic medical conditions Reviewed form and advanced directives Reviewed other doctors Had the 1 fall with extensive bruising. Walks with cane now--helps balance. Doing balance Still smokes 1-2 cigarettes a day Rare alcohol. Tries to exercise regularly 1 fall with injury Vision okay. Hearing is good Drives and does all instrumental ADLs No problems with cognition  Over the pneumonia No breathing problems now No chest pain No palpitations No dizziness or syncope  Having a lot of back pain Taking too much tylenol--up to 7 extra strength and PM Tramadol tid --but has nothing for the night Uses the tizanidine also Stays sore--?from the cold weather  Eating baklava and egg nog Knows she is up some-- 189 this AM Usually ~150 in AM Still with numbness in feet but no pain. Better with the cymbalta  Not feeling depressed Stress with daughter/SIL/2 grandkids in her house  Current Outpatient Prescriptions on File Prior to Visit  Medication Sig Dispense Refill  . amLODipine (NORVASC) 10 MG tablet Take 1 tablet (10 mg total) by mouth daily. **needs to schedule follow-up appt. with Dr. Silvio Pate for future refills** 90 tablet 0  . aspirin 81 MG tablet Take 81 mg by mouth daily.      . Calcium Carb-Cholecalciferol (OS-CAL 500 + D) 500-600 MG-UNIT TABS Take by mouth daily.      . cholestyramine (QUESTRAN) 4 G packet MIX AND DRINK 1 PACKET DAILY AS DIRECTED 60 packet 11  . diazepam (VALIUM) 2 MG tablet TAKE 1 TABLET BY MOUTH TWICE DAILY 60 tablet 0  . DULoxetine (CYMBALTA) 60 MG capsule Take 1 capsule (60 mg total) by mouth 2 (two) times daily. **needs to schedule follow-up appt. with Dr. Silvio Pate for future refills** 180 capsule 0  . FLUoxetine (PROZAC) 40 MG capsule TAKE 1 CAPSULE BY MOUTH EVERY DAY 90 capsule 3  . glipiZIDE (GLUCOTROL)  5 MG tablet TAKE 1 TABLET BY MOUTH TWICE DAILY BEFORE A MEAL 180 tablet 3  . losartan (COZAAR) 25 MG tablet Take 1 tablet (25 mg total) by mouth daily. **needs to schedule follow-up appt. with Dr. Silvio Pate for future refills** 90 tablet 0  . metFORMIN (GLUCOPHAGE-XR) 500 MG 24 hr tablet TAKE 4 TABLETS BY MOUTH EVERY DAY WITH BREAKFAST 120 tablet 0  . propranolol (INDERAL) 20 MG tablet TAKE 1 TABLET BY MOUTH TWICE DAILY 180 tablet 0  . senna-docusate (SENOKOT-S) 8.6-50 MG per tablet Take 1 tablet by mouth 2 (two) times daily.      . simvastatin (ZOCOR) 20 MG tablet Take 1 tablet (20 mg total) by mouth daily. **needs to schedule follow-up appt. with Dr. Silvio Pate for future refills** 90 tablet 0  . tiZANidine (ZANAFLEX) 2 MG tablet TAKE 1 TABLET BY MOUTH TWICE DAILY AS NEEDED FOR MUSCLE SPASMS 180 tablet 0  . traMADol (ULTRAM) 50 MG tablet TAKE 1 TABLET BY MOUTH THREE TIMES DAILY AS NEEDED 90 tablet 0  . TRUETEST TEST test strip TEST  BLOOD  SUGAR ONE TIME DAILY AS DIRECTED 100 each 3   No current facility-administered medications on file prior to visit.    Allergies  Allergen Reactions  . Bisoprolol Fumarate     REACTION: ha  . Chlorthalidone   . Lisinopril     REACTION: throat symptoms  . Sulfa Antibiotics     Told by mother as a  child  . Tetracyclines & Related Swelling    Throat swelling    Past Medical History  Diagnosis Date  . Depression   . Diabetes mellitus   . Hyperlipidemia   . Hypertension   . Osteoporosis   . 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 Chronic pain   . SBO (small bowel obstruction) ([facility])     obstipation  10/2005  . Falls frequently     Falls/pneumonia 1/12    Past Surgical History  Procedure Laterality Date  . Appendectomy      1946  Adhesions shortly after  . Abdominal hysterectomy      BSO 1985  cancer  . Kidney stone surgery      cysto-- [facility] 2003  . Bladder repair      bladder tacking- chronic pain since  1998  . Back surgery  1970's  .  Cholecystectomy      lysis of adhesions--post op complications, trach, etc  8/10  . Cataract extraction  12/12    left and then laser---Dr Tobe Sos    Family History  Problem Relation Age of Onset  . Coronary artery disease Father   . Hypertension Neg Hx   . Diabetes Neg Hx     Social History   Social History  . Marital Status: Married    Spouse Name: N/A  . Number of Children: 2  . Years of Education: N/A   Occupational History  . 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 Smoking status: Current Some Day Smoker  . Smokeless tobacco: Never Used     Comment: only once in a while. Gave info on 1-800-QUIT NOW  . Alcohol Use: No  . Drug Use: No  . Sexual Activity: Not on file   Other Topics Concern  . Not on file   Social History Narrative   Enjoys crafts      No living will   Daughter, SIL, kids living with her know   Son [name] should make health care decisions if POA needed   Would accept resuscitation attempts but no prolonged artificial ventilation   No feeding tubes if cognitively unaware            Review of Systems Appetite is good Weight fairly stable Sleeps okay with her meds--tylenol PM Wears seat belt in car News new dentures--full Bowels are soft with the miralax Sees Dr Vernie Ammons--- mammogram in 2015 reportedly normal Voids okay. Some incontinence--uses pad     Objective:   Physical Exam  Constitutional: She is oriented to person, place, and time. She appears well-developed and well-nourished. No distress.  HENT:  Mouth/Throat: Oropharynx is clear and moist. No oropharyngeal exudate.  Full dentures  Neck: Normal range of motion. Neck supple. No thyromegaly present.  Cardiovascular: Normal rate, regular rhythm, normal heart sounds and intact distal pulses.  Exam reveals no gallop.   No murmur heard. Pulmonary/Chest: Effort normal and breath sounds normal. No respiratory distress. She has no wheezes. She has no  rales.  Abdominal: Soft. There is no tenderness.  Musculoskeletal: She exhibits no edema or tenderness.  Lymphadenopathy:    She has no cervical adenopathy.  Neurological: She is alert and oriented to person, place, and time.  President-- "Obama, Bush, Clinton" [phone] D-l-r-[id number]-w Recall 3/3  Decreased sensation in feet  Skin: No rash noted. No erythema.  No foot lesions  Psychiatric: She has a normal mood and affect. Her behavior is  normal.          Assessment & Plan:

## 2015-02-26 NOTE — Assessment & Plan Note (Signed)
BP Readings from Last 3 Encounters:  02/26/15 132/80  06/26/14 144/50  05/06/14 148/62   Good control

## 2015-02-26 NOTE — Progress Notes (Signed)
Pre visit review using our clinic review tool, if applicable. No additional management support is needed unless otherwise documented below in the visit note. 

## 2015-02-26 NOTE — Addendum Note (Signed)
Addended by: Despina Hidden on: 02/26/2015 03:33 PM   Modules accepted: Orders

## 2015-02-26 NOTE — Assessment & Plan Note (Signed)
Chronic mild mood issues Mostly due to pain issues On the cymbalta

## 2015-02-27 LAB — COMPREHENSIVE METABOLIC PANEL
ALT: 13 U/L (ref 0–35)
AST: 15 U/L (ref 0–37)
Albumin: 4.2 g/dL (ref 3.5–5.2)
Alkaline Phosphatase: 87 U/L (ref 39–117)
BUN: 19 mg/dL (ref 6–23)
CO2: 28 meq/L (ref 19–32)
Calcium: 10.3 mg/dL (ref 8.4–10.5)
Chloride: 96 mEq/L (ref 96–112)
Creatinine, Ser: 1.03 mg/dL (ref 0.40–1.20)
GFR: 55.34 mL/min — AB (ref >60.00)
GLUCOSE: 214 mg/dL — AB (ref 70–99)
POTASSIUM: 4.4 meq/L (ref 3.5–5.1)
SODIUM: 133 meq/L — AB (ref 135–145)
Total Bilirubin: 0.3 mg/dL (ref 0.2–1.2)
Total Protein: 7.4 g/dL (ref 6.0–8.3)

## 2015-02-27 LAB — T4, FREE: FREE T4: 0.74 ng/dL (ref 0.60–1.60)

## 2015-02-27 LAB — LIPID PANEL
CHOL/HDL RATIO: 6
CHOLESTEROL: 221 mg/dL — AB (ref 0–200)
HDL: 35 mg/dL — AB (ref >39.00)
Triglycerides: 720 mg/dL — ABNORMAL HIGH (ref 0.0–149.0)

## 2015-02-27 LAB — CBC WITH DIFFERENTIAL/PLATELET
Basophils Absolute: 0 10*3/uL (ref 0.0–0.1)
Basophils Relative: 0.3 % (ref 0.0–3.0)
EOS ABS: 0.3 10*3/uL (ref 0.0–0.7)
EOS PCT: 2.2 % (ref 0.0–5.0)
HEMATOCRIT: 40.4 % (ref 36.0–46.0)
HEMOGLOBIN: 13.3 g/dL (ref 12.0–15.0)
LYMPHS PCT: 25.2 % (ref 12.0–46.0)
Lymphs Abs: 3.1 10*3/uL (ref 0.7–4.0)
MCHC: 33 g/dL (ref 30.0–36.0)
MCV: 91.4 fl (ref 78.0–100.0)
MONO ABS: 0.8 10*3/uL (ref 0.1–1.0)
Monocytes Relative: 6.4 % (ref 3.0–12.0)
Neutro Abs: 8.1 10*3/uL — ABNORMAL HIGH (ref 1.4–7.7)
Neutrophils Relative %: 65.9 % (ref 43.0–77.0)
Platelets: 345 10*3/uL (ref 150.0–400.0)
RBC: 4.42 Mil/uL (ref 3.87–5.11)
RDW: 12.9 % (ref 11.5–15.5)
WBC: 12.3 10*3/uL — ABNORMAL HIGH (ref 4.0–10.5)

## 2015-02-27 LAB — HEMOGLOBIN A1C: HEMOGLOBIN A1C: 8.3 % — AB (ref 4.6–6.5)

## 2015-02-27 LAB — LDL CHOLESTEROL, DIRECT: LDL DIRECT: 77 mg/dL

## 2015-03-02 ENCOUNTER — Encounter: Payer: Self-pay | Admitting: *Deleted

## 2015-03-09 ENCOUNTER — Other Ambulatory Visit: Payer: Self-pay | Admitting: Internal Medicine

## 2015-03-10 NOTE — Telephone Encounter (Signed)
Approved: #60 x 0 

## 2015-03-10 NOTE — Telephone Encounter (Signed)
02/02/2015 

## 2015-03-10 NOTE — Telephone Encounter (Signed)
rx called into pharmacy

## 2015-03-19 ENCOUNTER — Other Ambulatory Visit: Payer: Self-pay | Admitting: Internal Medicine

## 2015-03-22 ENCOUNTER — Other Ambulatory Visit: Payer: Self-pay | Admitting: Internal Medicine

## 2015-03-23 NOTE — Telephone Encounter (Signed)
Approved: #90 x 0 

## 2015-03-23 NOTE — Telephone Encounter (Signed)
rx called into pharmacy

## 2015-03-23 NOTE — Telephone Encounter (Signed)
02/23/2015 

## 2015-04-04 ENCOUNTER — Other Ambulatory Visit: Payer: Self-pay | Admitting: Internal Medicine

## 2015-04-19 ENCOUNTER — Other Ambulatory Visit: Payer: Self-pay | Admitting: Internal Medicine

## 2015-04-20 NOTE — Telephone Encounter (Signed)
Valium 03/10/2015 Prozac 05/29/2014 #90 3rfs Tramadol 03/23/2015

## 2015-04-20 NOTE — Telephone Encounter (Signed)
rx called into pharmacy rx sent to pharmacy by e-script

## 2015-04-20 NOTE — Telephone Encounter (Signed)
Approved: tramadol #90 x 0 Alprazolam #60 x 0 Fluoxetine for 1 year

## 2015-05-11 ENCOUNTER — Other Ambulatory Visit: Payer: Self-pay | Admitting: Internal Medicine

## 2015-05-17 ENCOUNTER — Other Ambulatory Visit: Payer: Self-pay | Admitting: Internal Medicine

## 2015-05-18 NOTE — Telephone Encounter (Signed)
Prescriptions called to pharmacy by Larene Beach.

## 2015-05-18 NOTE — Telephone Encounter (Signed)
Diazepam: Last Filled 04-20-15 #60/0 Tramadol: Last filled 04-20-15 #90/0 Last OV 02-25-18 Next OV 08-27-15

## 2015-05-18 NOTE — Telephone Encounter (Signed)
Approved: #60 x 0 for diazepam #90 x 0 for tramadol

## 2015-06-15 ENCOUNTER — Other Ambulatory Visit: Payer: Self-pay | Admitting: Internal Medicine

## 2015-06-15 NOTE — Telephone Encounter (Signed)
Approved: #60 x 0 for diazepam and #90 x 0 for tramadol

## 2015-06-15 NOTE — Telephone Encounter (Signed)
Left refills on voice mail at pharmacy

## 2015-06-15 NOTE — Telephone Encounter (Signed)
Last Refill for both meds 05-18-15 Last OV 02-26-2015 Next OV 08-27-15  Diazepam: #60 Tramadol: #90

## 2015-07-13 ENCOUNTER — Other Ambulatory Visit: Payer: Self-pay | Admitting: Internal Medicine

## 2015-07-13 NOTE — Telephone Encounter (Signed)
Last Refill for both meds 06-15-15 Last OV 02-26-2015 Next OV 08-27-15

## 2015-07-13 NOTE — Telephone Encounter (Signed)
Approved: okay tramadol #90 x 0 and diazepam #60 x 0

## 2015-07-13 NOTE — Telephone Encounter (Signed)
Left refills on voice mail at pharmacy

## 2015-08-07 ENCOUNTER — Other Ambulatory Visit: Payer: Self-pay | Admitting: Internal Medicine

## 2015-08-07 NOTE — Telephone Encounter (Signed)
Both meds last filled 07-13-15. Last OV 02-26-15 Next OV 08-27-15. Sending to Dr Silvio Pate since the next refill is not technically due until 08-12-15.

## 2015-08-07 NOTE — Telephone Encounter (Signed)
Left refill on voice mail at pharmacy Left message for pt to call back

## 2015-08-07 NOTE — Telephone Encounter (Signed)
Approved: okay to approve same quantities of each x 0 Let her know her next refill is not due till 7/1

## 2015-08-18 ENCOUNTER — Other Ambulatory Visit: Payer: Self-pay | Admitting: Internal Medicine

## 2015-08-20 IMAGING — CR DG CHEST 2V
2 series · 2 of 2 positions shown · non-contrast
Comparison: 04/03/2014

CLINICAL DATA: Follow-up left lower lobe pneumonia.

EXAM:
CHEST  2 VIEW

[view not recorded (1 of 2)]
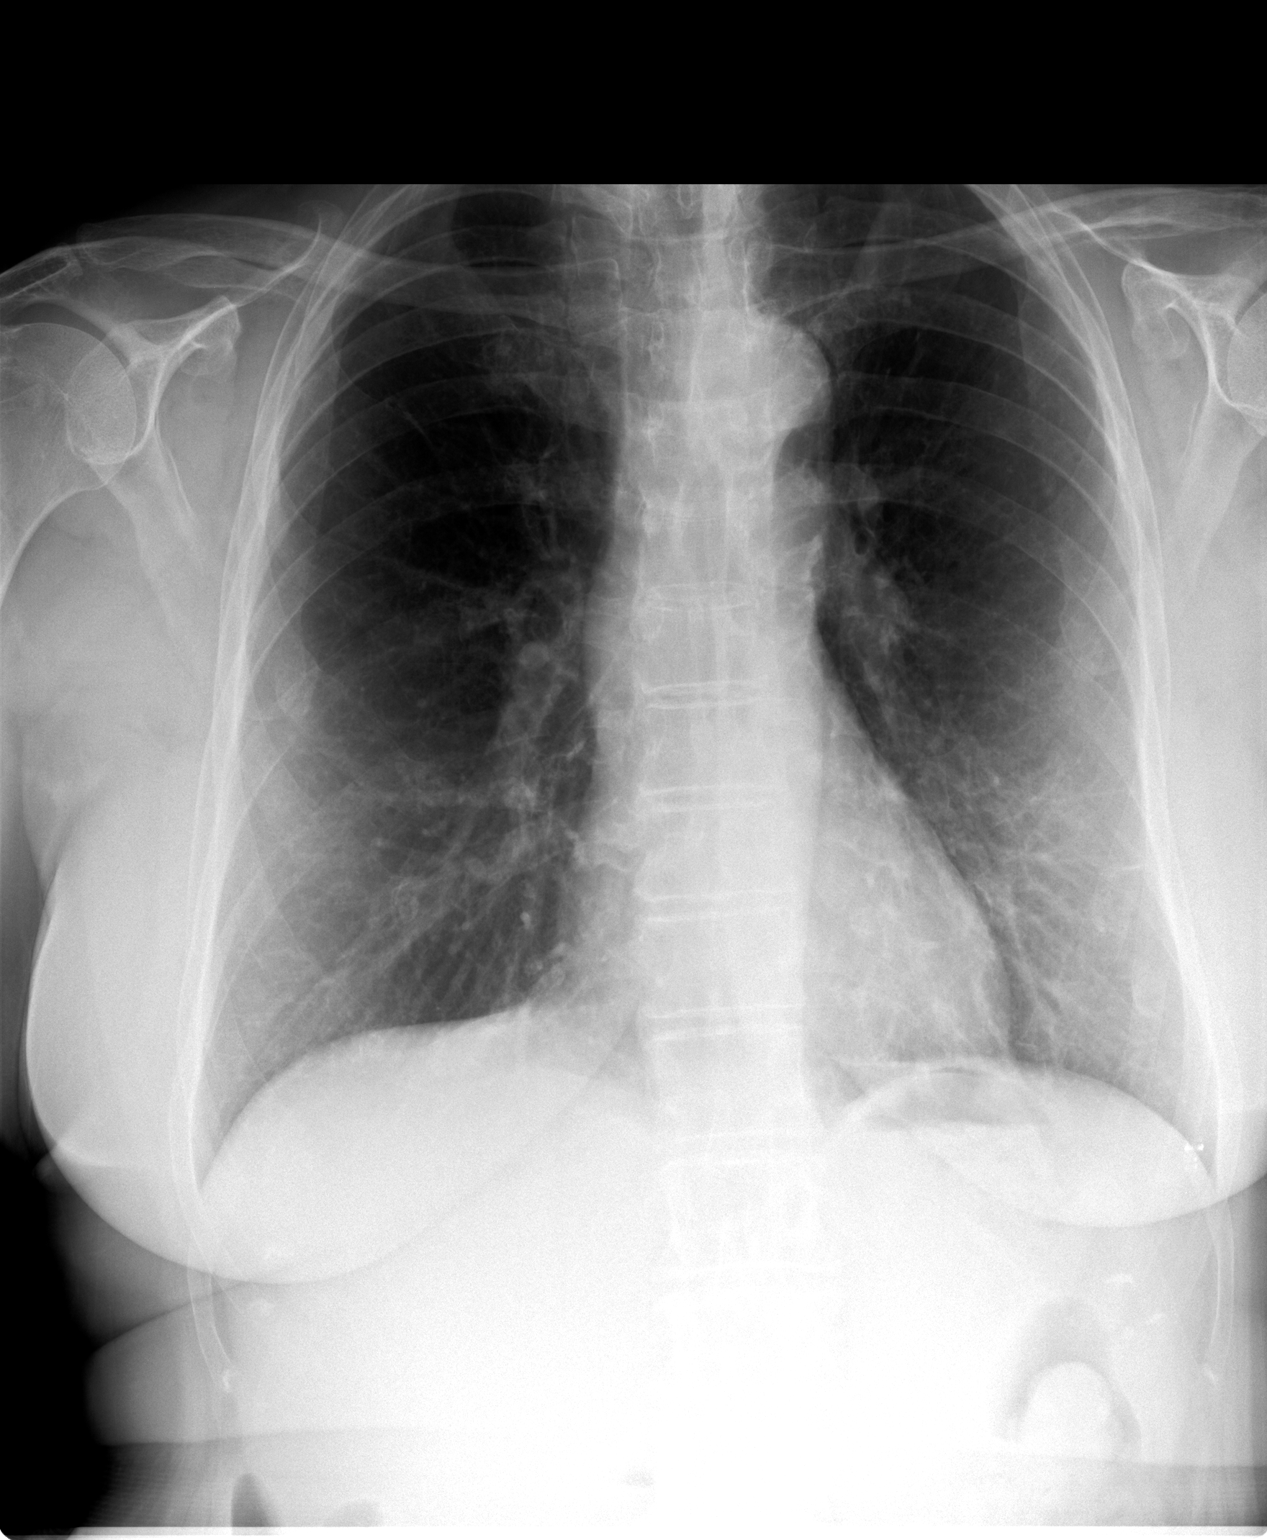

[view not recorded (2 of 2)]
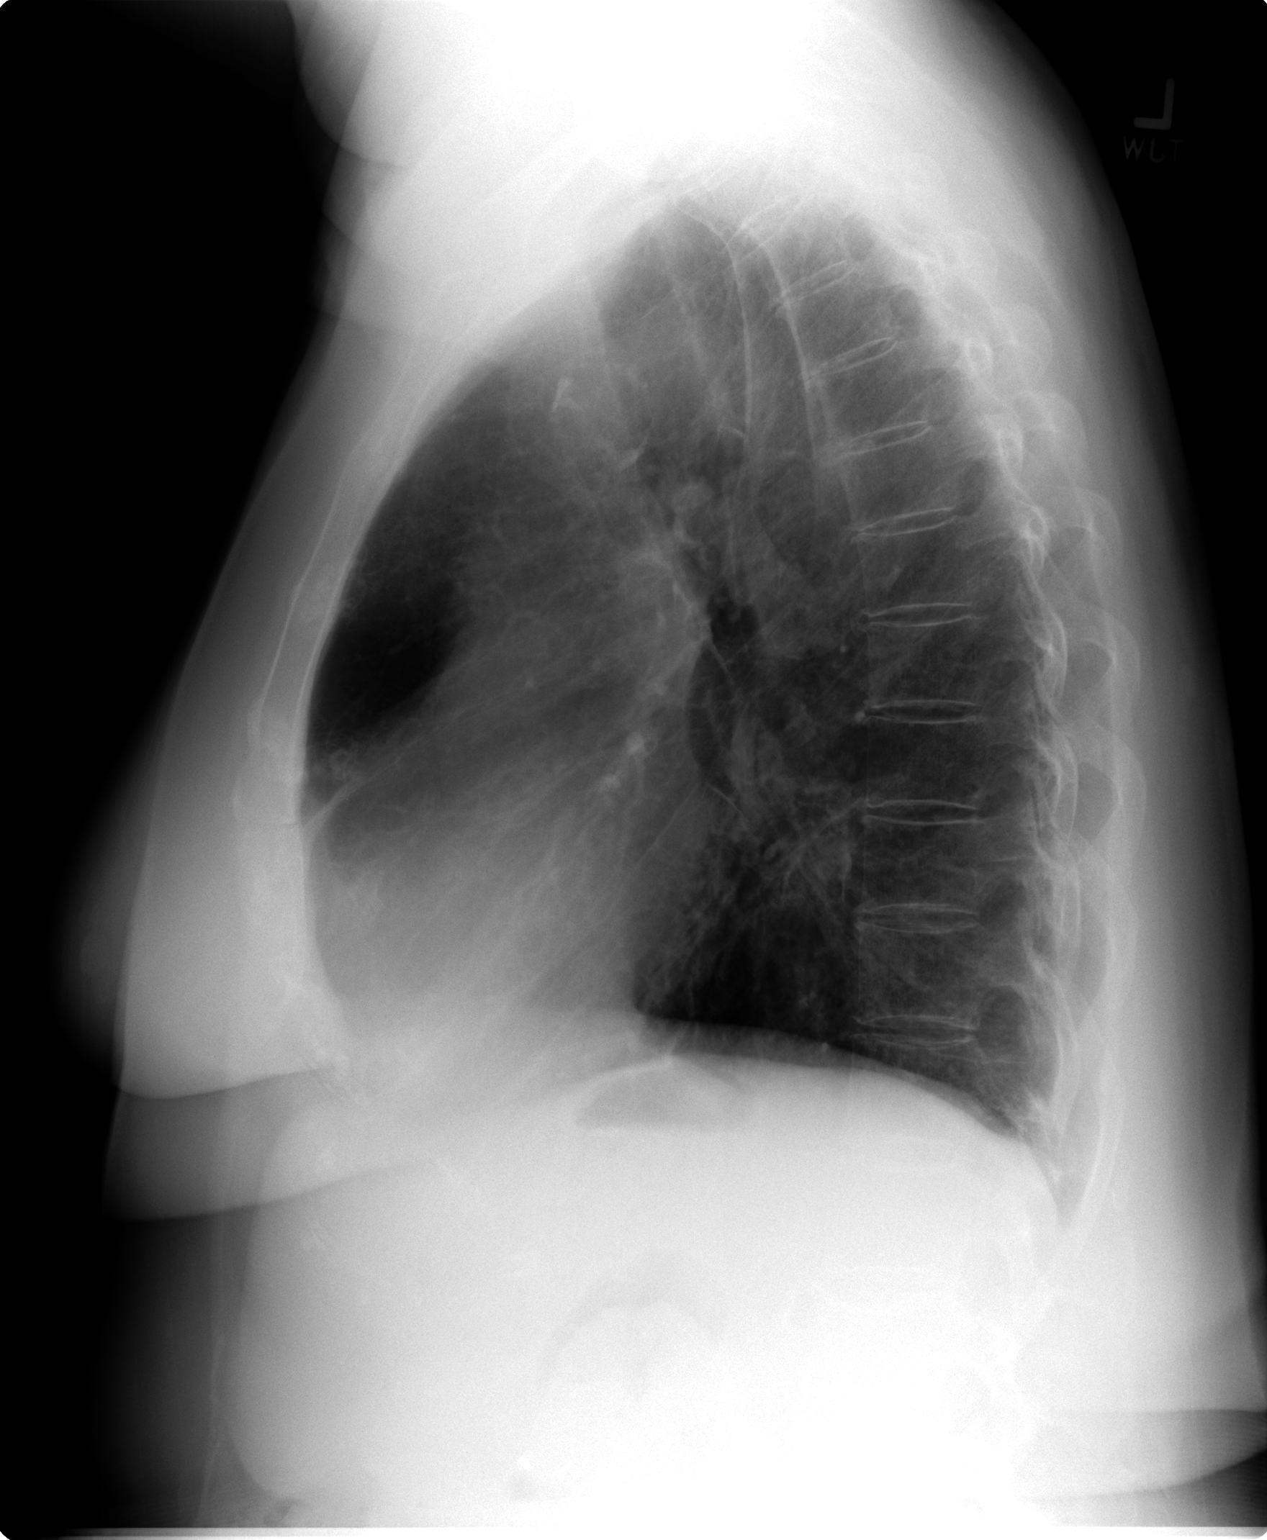

[2 of 2 positions shown; findings below may reference images not displayed]

FINDINGS: Previously seen left lower lobe infiltrate has resolved. No
confluent opacity currently. No effusions. Heart is normal size.
Mediastinal contours are within normal limits. No acute bony
abnormality.
IMPRESSION: Resolution of the previously seen left lower lobe pneumonia.

## 2015-08-27 ENCOUNTER — Ambulatory Visit: Payer: Medicare PPO | Admitting: Internal Medicine

## 2015-09-09 ENCOUNTER — Other Ambulatory Visit: Payer: Self-pay | Admitting: Internal Medicine

## 2015-09-09 NOTE — Telephone Encounter (Signed)
Both last filled 08-07-15 Last OV 02-26-15 Next OV 09-24-15 Tramadol #90 Diazepam #60

## 2015-09-09 NOTE — Telephone Encounter (Signed)
Left refills on voice mail at pharmacy

## 2015-09-09 NOTE — Telephone Encounter (Signed)
Approved: tramadol #90 x 0 Diazepam #60 x 0

## 2015-09-24 ENCOUNTER — Ambulatory Visit: Payer: Medicare PPO | Admitting: Internal Medicine

## 2015-10-07 ENCOUNTER — Other Ambulatory Visit: Payer: Self-pay | Admitting: Internal Medicine

## 2015-10-07 NOTE — Telephone Encounter (Signed)
Approved: same quantities of each x 0

## 2015-10-07 NOTE — Telephone Encounter (Signed)
Left refills on voice mail at pharmacy

## 2015-10-13 ENCOUNTER — Ambulatory Visit: Payer: Medicare PPO | Admitting: Internal Medicine

## 2015-11-03 ENCOUNTER — Other Ambulatory Visit: Payer: Self-pay | Admitting: Internal Medicine

## 2015-11-04 ENCOUNTER — Other Ambulatory Visit: Payer: Self-pay | Admitting: Internal Medicine

## 2015-11-04 NOTE — Telephone Encounter (Signed)
Approved: okay tramadol #90 x 0 Diazepam #60 x 0

## 2015-11-04 NOTE — Telephone Encounter (Signed)
Left refills on voice mail at pharmacy

## 2015-11-04 NOTE — Telephone Encounter (Signed)
Both Last Filled 10-07-15 Tramadol #90 Diazepam #60 Last OV 02-26-15 Next OV 11-12-15

## 2015-11-09 ENCOUNTER — Ambulatory Visit (INDEPENDENT_AMBULATORY_CARE_PROVIDER_SITE_OTHER)
Admission: RE | Admit: 2015-11-09 | Discharge: 2015-11-09 | Disposition: A | Discharge status: Home / Self Care | Payer: Medicare Other | Source: Ambulatory Visit | Attending: Internal Medicine | Admitting: Internal Medicine

## 2015-11-09 ENCOUNTER — Ambulatory Visit (INDEPENDENT_AMBULATORY_CARE_PROVIDER_SITE_OTHER): Payer: Medicare Other | Admitting: Internal Medicine

## 2015-11-09 VITALS — BP 140/70 | HR 57 | Temp 98.3°F | Wt 167.0 lb

## 2015-11-09 DIAGNOSIS — E1140 Type 2 diabetes mellitus with diabetic neuropathy, unspecified: Secondary | ICD-10-CM

## 2015-11-09 DIAGNOSIS — E1165 Type 2 diabetes mellitus with hyperglycemia: Secondary | ICD-10-CM | POA: Diagnosis not present

## 2015-11-09 DIAGNOSIS — M79672 Pain in left foot: Secondary | ICD-10-CM | POA: Diagnosis not present

## 2015-11-09 DIAGNOSIS — IMO0002 Reserved for concepts with insufficient information to code with codable children: Secondary | ICD-10-CM

## 2015-11-09 LAB — HEMOGLOBIN A1C: HEMOGLOBIN A1C: 8.9 % — AB (ref 4.6–6.5)

## 2015-11-09 NOTE — Progress Notes (Signed)
Subjective:    Patient ID: Jaclyn Peterson, female    DOB: 08-07-1940, 75 y.o.   MRN: [id number]  HPI Here due to toe pain With daughter  Terrible pain in left 5th toe Noted some diarrhea 3 days ago--then went in shower Noticed toe was dark and swollen She had to reposition it (trying to clean in between toes)--then got severe pain  Able to bear weight--but can't walk for long Trying elevation and ice (yesterday)  Current Outpatient Prescriptions on File Prior to Visit  Medication Sig Dispense Refill  . amLODipine (NORVASC) 10 MG tablet TAKE 1 TABLET(10 MG) BY MOUTH DAILY 90 tablet 3  . aspirin 81 MG tablet Take 81 mg by mouth daily.      . Calcium Carb-Cholecalciferol (OS-CAL 500 + D) 500-600 MG-UNIT TABS Take by mouth daily.      . cholestyramine (QUESTRAN) 4 g packet MIX AND DRINK 1 PACKET BY MOUTH DAILY AS DIRECTED 60 packet 0  . diazepam (VALIUM) 2 MG tablet TAKE 1 TABLET BY MOUTH TWICE DAILY AS NEEDED 60 tablet 0  . DULoxetine (CYMBALTA) 60 MG capsule TAKE 1 CAPSULE BY MOUTH TWICE DAILY 180 capsule 3  . FLUoxetine (PROZAC) 40 MG capsule TAKE 1 CAPSULE BY MOUTH EVERY DAY 90 capsule 3  . glipiZIDE (GLUCOTROL) 5 MG tablet TAKE 1 TABLET BY MOUTH TWICE DAILY BEFORE A MEAL 180 tablet 3  . losartan (COZAAR) 25 MG tablet TAKE 1 TABLET(25 MG) BY MOUTH DAILY 90 tablet 3  . metFORMIN (GLUCOPHAGE-XR) 500 MG 24 hr tablet TAKE 4 TABLETS BY MOUTH EVERY DAY WITH BREAKFAST 120 tablet 11  . propranolol (INDERAL) 20 MG tablet TAKE 1 TABLET BY MOUTH TWICE DAILY 180 tablet 1  . senna-docusate (SENOKOT-S) 8.6-50 MG per tablet Take 1 tablet by mouth 2 (two) times daily.      . simvastatin (ZOCOR) 20 MG tablet TAKE 1 TABLET(20 MG) BY MOUTH DAILY 90 tablet 3  . simvastatin (ZOCOR) 20 MG tablet TAKE 1 TABLET(20 MG) BY MOUTH DAILY 90 tablet 3  . tiZANidine (ZANAFLEX) 2 MG tablet TAKE 1 TABLET BY MOUTH TWICE DAILY AS NEEDED FOR MUSCLE SPASMS 180 tablet 3  . traMADol (ULTRAM) 50 MG tablet TAKE 1  TABLET BY MOUTH THREE TIMES DAILY AS NEEDED 90 tablet 0  . TRUETEST TEST test strip TEST  BLOOD  SUGAR ONE TIME DAILY AS DIRECTED 100 each 3   No current facility-administered medications on file prior to visit.     Allergies  Allergen Reactions  . Bisoprolol Fumarate     REACTION: ha  . Chlorthalidone   . Lisinopril     REACTION: throat symptoms  . Sulfa Antibiotics     Told by mother as a child  . Tetracyclines & Related Swelling    Throat swelling    Past Medical History:  Diagnosis Date  . Chronic pain   . Depression   . Diabetes mellitus   . Falls frequently    Falls/pneumonia 1/12  . Hyperlipidemia   . Hypertension   . Osteoporosis   . SBO (small bowel obstruction) ([facility])    obstipation  10/2005  . Uterine cancer ([facility])    P32 INSERT    Past Surgical History:  Procedure Laterality Date  . ABDOMINAL HYSTERECTOMY     BSO 1985  cancer  . APPENDECTOMY     1946  Adhesions shortly after  . BACK SURGERY  1970's  . BLADDER REPAIR     bladder tacking- chronic pain since  1998  . CATARACT EXTRACTION  12/12   left and then laser---Dr Tobe Sos  . CHOLECYSTECTOMY     lysis of adhesions--post op complications, trach, etc  8/10  . KIDNEY STONE SURGERY     cysto-- [facility] 2003    Family History  Problem Relation Age of Onset  . Coronary artery disease Father   . Hypertension Neg Hx   . Diabetes Neg Hx     Social History   Social History  . Marital status: Married    Spouse name: N/A  . Number of children: 2  . Years of education: N/A   Occupational History  . 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 Smoking status: Current Some Day Smoker  . Smokeless tobacco: Never Used     Comment: only once in a while. Gave info on 1-800-QUIT NOW  . Alcohol use No  . Drug use: No  . Sexual activity: Not on file   Other Topics Concern  . Not on file   Social History Narrative   Enjoys crafts      No living will   Daughter,  SIL, kids living with her know   Son [name] should make health care decisions if POA needed   Would accept resuscitation attempts but no prolonged artificial ventilation   No feeding tubes if cognitively unaware            Review of Systems Sugars are running a bit high--over 200 at times Is careful with eating Weight is stable    Objective:   Physical Exam  Musculoskeletal:  Left foot is swollen Marked ecchymosis of left 5th toe and slightly into foot Some tenderness at distal 5th metatarsal and 4th metatarsal          Assessment & Plan:

## 2015-11-09 NOTE — Assessment & Plan Note (Signed)
Will recheck control

## 2015-11-09 NOTE — Progress Notes (Signed)
Pre visit review using our clinic review tool, if applicable. No additional management support is needed unless otherwise documented below in the visit note. 

## 2015-11-09 NOTE — Assessment & Plan Note (Addendum)
Toe is bruised and looks broken Will check x-ray to be sure--and exclude metatarsal fracture  X-ray is negative Must just be bruising Continue the tramadol Discussed --- I am not comfortable giving her any narcotic (she was fairly demanding)

## 2015-11-12 ENCOUNTER — Telehealth: Payer: Self-pay | Admitting: Internal Medicine

## 2015-11-12 ENCOUNTER — Ambulatory Visit: Payer: Medicare PPO | Admitting: Internal Medicine

## 2015-11-12 DIAGNOSIS — Z0289 Encounter for other administrative examinations: Secondary | ICD-10-CM

## 2015-11-12 NOTE — Telephone Encounter (Signed)
That appt should have been cancelled

## 2015-11-12 NOTE — Telephone Encounter (Signed)
Patient did not come in for their appointment todayfor 6 month follow up  Please let me know if patient needs to be contacted immediately for follow up or no follow up needed.

## 2015-11-13 ENCOUNTER — Other Ambulatory Visit: Payer: Self-pay | Admitting: Internal Medicine

## 2015-11-17 ENCOUNTER — Encounter: Payer: Self-pay | Admitting: Family Medicine

## 2015-11-17 ENCOUNTER — Ambulatory Visit (INDEPENDENT_AMBULATORY_CARE_PROVIDER_SITE_OTHER): Payer: Medicare Other | Admitting: Family Medicine

## 2015-11-17 DIAGNOSIS — R238 Other skin changes: Secondary | ICD-10-CM | POA: Diagnosis not present

## 2015-11-17 DIAGNOSIS — R239 Unspecified skin changes: Secondary | ICD-10-CM

## 2015-11-17 NOTE — Progress Notes (Signed)
Pre visit review using our clinic review tool, if applicable. No additional management support is needed unless otherwise documented below in the visit note. 

## 2015-11-17 NOTE — Telephone Encounter (Signed)
Rose, can you take care of this when the charge drops? Thanks!

## 2015-11-17 NOTE — Progress Notes (Signed)
L 5th toe pain about 10 days ago.  That resolved in the meantime.  Now with some bruising on the L 2nd and 3rd toes.  Also with some discoloration in the B plantar sides of the feet and along the dorsum of the feet.  No foot pain.  No FCNAVD now; she vomited last week, resolved now.  Known h/o DM2.  Last fell about 1 month ago.  No falls more recently.    Meds, vitals, and allergies reviewed.   ROS: Per HPI unless specifically indicated in ROS section   nad ncat rrr Ext w/o edema.  It looks like she has some hemosiderin deposits in the BLE but no acute erythema.  This looks chronic.   She has bruising at base of L 2nd and 3rd toe, not ttp.  DP pulses intact.   Sensation intact in the BLE

## 2015-11-17 NOTE — Patient Instructions (Signed)
I think the bruising should resolve.  The other discoloration is likely not going to change but you don't have to do anything about it.   This doesn't look infected.  Take care.  Glad to see you.  Update [place] as needed.

## 2015-11-18 ENCOUNTER — Telehealth: Payer: Self-pay

## 2015-11-18 DIAGNOSIS — R239 Unspecified skin changes: Secondary | ICD-10-CM | POA: Insufficient documentation

## 2015-11-18 MED ORDER — SITAGLIPTIN PHOSPHATE 100 MG PO TABS: 100.0000 mg | ORAL_TABLET | Freq: Every day | ORAL | 11 refills | Status: DC

## 2015-11-18 NOTE — Telephone Encounter (Signed)
Let her know that I sent the new prescription. She should let me know if she has any problems with it I will recheck her labs at her next visit

## 2015-11-18 NOTE — Telephone Encounter (Signed)
Spoke to pt. She will let us know if she has any issues.

## 2015-11-18 NOTE — Assessment & Plan Note (Signed)
Looks to be 2 different, benign issues.  It looks like she has some hemosiderin deposits in the BLE but no acute erythema.  This looks chronic.   She has bruising at base of L 2nd and 3rd toe, not ttp.  I wouldn't intervene on either.  D/w pt.  F/u prn.   She agrees.

## 2015-11-18 NOTE — Telephone Encounter (Signed)
Pt saw Dr Damita Dunnings yesterday and advised Lugene that she wanted to start on a new diabetic medication as stated in the A1C results.

## 2015-11-25 NOTE — Telephone Encounter (Signed)
Junie Panning, this is another one like this morning that you had to go to appointment desk and edit appointment statistics.  Can you take care of this one as well, please?  Thank you!

## 2015-12-02 ENCOUNTER — Other Ambulatory Visit: Payer: Self-pay | Admitting: Internal Medicine

## 2015-12-02 NOTE — Telephone Encounter (Signed)
Approved: okay same amounts for each with no refills

## 2015-12-02 NOTE — Telephone Encounter (Signed)
Left refills on voice mail at pharmacy

## 2015-12-02 NOTE — Telephone Encounter (Signed)
Both last filled 11-04-15  Diazepam #60 Tramadol #90 Last OV 11-09-15 Next OV 03-24-16

## 2015-12-04 NOTE — Telephone Encounter (Signed)
Pt called about no show charge  Best number [phone]

## 2015-12-15 NOTE — Telephone Encounter (Signed)
Emailed chg corr to remove no show fee.

## 2015-12-22 ENCOUNTER — Encounter: Payer: Self-pay | Admitting: Family Medicine

## 2015-12-22 ENCOUNTER — Ambulatory Visit (INDEPENDENT_AMBULATORY_CARE_PROVIDER_SITE_OTHER): Payer: Medicare Other | Admitting: Family Medicine

## 2015-12-22 VITALS — BP 140/80 | HR 54 | Temp 98.4°F | Wt 163.0 lb

## 2015-12-22 DIAGNOSIS — H8110 Benign paroxysmal vertigo, unspecified ear: Secondary | ICD-10-CM | POA: Insufficient documentation

## 2015-12-22 DIAGNOSIS — W19XXXA Unspecified fall, initial encounter: Secondary | ICD-10-CM

## 2015-12-22 DIAGNOSIS — H8113 Benign paroxysmal vertigo, bilateral: Secondary | ICD-10-CM | POA: Diagnosis not present

## 2015-12-22 MED ORDER — MECLIZINE HCL 25 MG PO TABS: 25.0000 mg | ORAL_TABLET | Freq: Three times a day (TID) | ORAL | 0 refills | Status: DC | PRN

## 2015-12-22 NOTE — Assessment & Plan Note (Signed)
Initial encounter for fall on Friday (though she falls often for different reasons  This time due to new vertigo (which is improving) Reassuring exam - no s/s of concussion Disc fall risk in detail and enc her to continue disc with PCP inst to use walker all the time- pt and family voiced understanding Move and change position slowly  May benefit from PT in future when her vertigo is resolved

## 2015-12-22 NOTE — Progress Notes (Signed)
Subjective:    Patient ID: Jaclyn Peterson, female    DOB: [birth year], 75 y.o.   MRN: [id number]  HPI Here for dizziness Dizzy for 2 weeks -falling a lot  On Friday hit her head on a cabinet-- had gotten up to get something from the table  Does not think she hurt herself at all however  Head feels fine  In addition her feet hurt   Of note has hx of frequent falls - she does not know why  Dr Silvio Pate did suggest use of a walker at all times    Lately has had vertigo -worse if she lies on R side  Feels like room is spinning (brief episodes at night)-then generally dizzy through the day  Improved overall but not better  Moving slowly - using great care  Ears feel itchy inside - she over uses Q tips  Hearing is fair  No congestion or nasal symptoms that are new  No fever (felt cold this am)   No alcohol , no drugs    CT head 2012 Small vessel isch dz Has hx of smoking - 1/2 ppd   BP Readings from Last 3 Encounters:  12/22/15 140/80  11/17/15 (!) 182/78  11/09/15 140/70   She does take zquil for sleep   Pulse Readings from Last 3 Encounters:  12/22/15 (!) 54  11/17/15 (!) 59  11/09/15 (!) 57   Patient Active Problem List   Diagnosis Date Noted  . Benign paroxysmal positional vertigo 12/22/2015  . Skin complaints 11/18/2015  . Foot pain, left 11/09/2015  . Advanced directives, counseling/discussion 11/01/2013  . Routine general medical examination at a health care facility 08/11/2011  . Type 2 diabetes mellitus with neurological manifestations, uncontrolled ([place]) 09/02/2006  . HYPERLIPIDEMIA 08/29/2006  . Episodic mood disorder ([place]) 08/29/2006  . PAIN, CHRONIC NEC 08/29/2006  . Essential hypertension, benign 08/29/2006  . OSTEOPOROSIS 08/29/2006  . UTERINE CANCER, HX OF 08/29/2006   Past Medical History:  Diagnosis Date  . Chronic pain   . Depression   . Diabetes mellitus   . Falls frequently    Falls/pneumonia 1/12  . Hyperlipidemia   .  Hypertension   . Osteoporosis   . SBO (small bowel obstruction)    obstipation  10/2005  . Uterine cancer ([place])    P32 INSERT   Past Surgical History:  Procedure Laterality Date  . ABDOMINAL HYSTERECTOMY     BSO 1985  cancer  . APPENDECTOMY     1946  Adhesions shortly after  . BACK SURGERY  1970's  . BLADDER REPAIR     bladder tacking- chronic pain since  1998  . CATARACT EXTRACTION  12/12   left and then laser---Dr Tobe Sos  . CHOLECYSTECTOMY     lysis of adhesions--post op complications, trach, etc  8/10  . KIDNEY STONE SURGERY     cysto-- [facility] 2003   Social History  Substance Use Topics  . Smoking status: Current Some Day Smoker  . Smokeless tobacco: Never Used     Comment: only once in a while. Gave info on 1-800-QUIT NOW  . Alcohol use No   Family History  Problem Relation Age of Onset  . Coronary artery disease Father   . Hypertension Neg Hx   . Diabetes Neg Hx    Allergies  Allergen Reactions  . Bisoprolol Fumarate     REACTION: ha  . Chlorthalidone   . Lisinopril     REACTION: throat symptoms  . Sulfa  Antibiotics     Told by mother as a child  . Tetracyclines & Related Swelling    Throat swelling   Current Outpatient Prescriptions on File Prior to Visit  Medication Sig Dispense Refill  . amLODipine (NORVASC) 10 MG tablet TAKE 1 TABLET(10 MG) BY MOUTH DAILY 90 tablet 3  . aspirin 81 MG tablet Take 81 mg by mouth daily.      . Calcium Carb-Cholecalciferol (OS-CAL 500 + D) 500-600 MG-UNIT TABS Take by mouth daily.      . cholestyramine (QUESTRAN) 4 g packet MIX AND DRINK 1 PACKET BY MOUTH DAILY AS DIRECTED 60 packet 0  . diazepam (VALIUM) 2 MG tablet TAKE 1 TABLET BY MOUTH TWICE DAILY AS NEEDED 60 tablet 0  . DULoxetine (CYMBALTA) 60 MG capsule TAKE 1 CAPSULE BY MOUTH TWICE DAILY 180 capsule 3  . FLUoxetine (PROZAC) 40 MG capsule TAKE 1 CAPSULE BY MOUTH EVERY DAY 90 capsule 3  . glipiZIDE (GLUCOTROL) 5 MG tablet TAKE 1 TABLET BY MOUTH TWICE DAILY BEFORE  A MEAL 180 tablet 3  . losartan (COZAAR) 25 MG tablet TAKE 1 TABLET(25 MG) BY MOUTH DAILY 90 tablet 3  . metFORMIN (GLUCOPHAGE-XR) 500 MG 24 hr tablet TAKE 4 TABLETS BY MOUTH EVERY DAY WITH BREAKFAST 120 tablet 11  . propranolol (INDERAL) 20 MG tablet TAKE 1 TABLET BY MOUTH TWICE DAILY 180 tablet 1  . senna-docusate (SENOKOT-S) 8.6-50 MG per tablet Take 1 tablet by mouth 2 (two) times daily.      . simvastatin (ZOCOR) 20 MG tablet TAKE 1 TABLET(20 MG) BY MOUTH DAILY 90 tablet 3  . sitaGLIPtin (JANUVIA) 100 MG tablet Take 1 tablet (100 mg total) by mouth daily. 30 tablet 11  . tiZANidine (ZANAFLEX) 2 MG tablet TAKE 1 TABLET BY MOUTH TWICE DAILY AS NEEDED FOR MUSCLE SPASMS 180 tablet 3  . traMADol (ULTRAM) 50 MG tablet TAKE 1 TABLET BY MOUTH THREE TIMES DAILY AS NEEDED 90 tablet 0  . TRUETEST TEST test strip TEST  BLOOD  SUGAR ONE TIME DAILY AS DIRECTED 100 each 3   No current facility-administered medications on file prior to visit.      Review of Systems    Review of Systems  Constitutional: Negative for fever, appetite change, fatigue and unexpected weight change.  Eyes: Negative for pain and visual disturbance.  Respiratory: Negative for cough and shortness of breath.   Cardiovascular: Negative for cp or palpitations    Gastrointestinal: Negative for nausea, diarrhea and constipation.  Genitourinary: Negative for urgency and frequency.  Skin: Negative for pallor or rash   MSK pos for chronic aches and pains  Neurological: Negative for weakness, light-headedness, numbness and headaches. (prev migraines are not active), neg for facial droop or memory issues or speech problems , pos for vertigo symptoms and falls  Hematological: Negative for adenopathy. Does not bruise/bleed easily.  Psychiatric/Behavioral: Negative for dysphoric mood. The patient is not nervous/anxious.      Objective:   Physical Exam  Constitutional: She appears well-developed and well-nourished. No distress.  Frail  appearing elderly female who is mentally sharp  HENT:  Head: Normocephalic and atraumatic.  Mouth/Throat: Oropharynx is clear and moist.  TMs are dull but clear  No cerumen  Nares are boggy with scant clear rhinorrhea   Horizontal nystagmus bilat - 2-3 beats with reprod of dizziness  No facial or head tenderness   Eyes: Conjunctivae and EOM are normal. Pupils are equal, round, and reactive to light.  Neck: Normal range  of motion. Neck supple. No JVD present. Carotid bruit is not present. No thyromegaly present.  Cardiovascular: Regular rhythm and normal heart sounds.  Exam reveals no gallop.   HR in 50s  Varicosities in ankles and feet   Pulmonary/Chest: Effort normal and breath sounds normal. No respiratory distress. She has no wheezes. She has no rales.  No crackles  Abdominal: Soft. Bowel sounds are normal. She exhibits no distension, no abdominal bruit and no mass. There is no tenderness.  Musculoskeletal: She exhibits no edema or tenderness.  Some old ecchymosis on R arm from recent fall  Lymphadenopathy:    She has no cervical adenopathy.  Neurological: She is alert. She has normal reflexes. She displays no atrophy and no tremor. No sensory deficit. She exhibits normal muscle tone. She displays a negative Romberg sign. Coordination and gait normal.  Skin: Skin is warm and dry. No rash noted. No pallor.  Psychiatric: She has a normal mood and affect.  Mentally sharp Talkative and in good spirits  familly present           Assessment & Plan:   Problem List Items Addressed This Visit      Nervous and Auditory   Benign paroxysmal positional vertigo    Positional symptoms for 1 week-gradually improving  Disc causes incl ear problems/ viral  No CVA symptoms  Had one fall -claims no injury Stressed imp of walker use at all times for this and also general fall risk  Nl neuro exam Px meclizine to use tid prn with caution of sedation and other side eff  Will update if no  improvement or worse  F/u PCP        Other   Fall    Initial encounter for fall on Friday (though she falls often for different reasons  This time due to new vertigo (which is improving) Reassuring exam - no s/s of concussion Disc fall risk in detail and enc her to continue disc with PCP inst to use walker all the time- pt and family voiced understanding Move and change position slowly  May benefit from PT in future when her vertigo is resolved       Other Visit Diagnoses   None.

## 2015-12-22 NOTE — Patient Instructions (Signed)
I think you have some vertigo (benign positional vertigo)  Change positions slowly -especially rolling over in bed or getting up  Use a walker at all times to prevent falls  Try the meclizine with caution of sedation   Update if not starting to improve in a week or if worsening    If you develop headache or nausea -that can be a sign of a concussion so alert [place] immediately and seek care   Follow up with Dr Silvio Pate as planned

## 2015-12-22 NOTE — Assessment & Plan Note (Signed)
Positional symptoms for 1 week-gradually improving  Disc causes incl ear problems/ viral  No CVA symptoms  Had one fall -claims no injury Stressed imp of walker use at all times for this and also general fall risk  Nl neuro exam Px meclizine to use tid prn with caution of sedation and other side eff  Will update if no improvement or worse  F/u PCP

## 2015-12-29 ENCOUNTER — Other Ambulatory Visit: Payer: Self-pay | Admitting: Internal Medicine

## 2015-12-29 NOTE — Telephone Encounter (Signed)
Both last filled 12-02-15 Tramadol #90 Diazepam #60 Last OV 11-09-15 Next OV 03-24-16

## 2015-12-29 NOTE — Telephone Encounter (Signed)
Spoke to pt. She is not out of medication, just wants to make sure the pharmacy has it when she needs it. Said she will do better next month.  Left refill on voice mail at pharmacy

## 2015-12-29 NOTE — Telephone Encounter (Signed)
Approved: Okay to refill for same quantities x 0 She is again 3 days early this month--- let her know not to call early again next month

## 2016-01-14 ENCOUNTER — Other Ambulatory Visit: Payer: Self-pay | Admitting: Family Medicine

## 2016-01-14 NOTE — Telephone Encounter (Signed)
If no improvement in 1-2 days I want to ref her to ENT- please keep me posted

## 2016-01-14 NOTE — Telephone Encounter (Signed)
Rx filled  Called pt and she said she was doing a lot better for a while and had stopped taking the med but on last Friday she became dizzy and fell against a wall/window at her home and she has been dizzy every since pt said she didn't injure her self during the fall and doesn't think she needs to be eval for it but her dizziness is about the same as when she saw you now. She said it's pretty bad and almost constant

## 2016-01-14 NOTE — Telephone Encounter (Signed)
Pt notified of Dr. Marliss Coots instructions and verbalize understanding

## 2016-01-14 NOTE — Telephone Encounter (Signed)
Please refill times one and ask her how she is feeling

## 2016-01-14 NOTE — Telephone Encounter (Signed)
Dr. Glori Bickers saw pt on 12/22/15 for vertigo Rx was filled on that day #30 with 0 refills, please advise

## 2016-01-16 ENCOUNTER — Other Ambulatory Visit: Payer: Self-pay | Admitting: Internal Medicine

## 2016-01-28 ENCOUNTER — Other Ambulatory Visit: Payer: Self-pay | Admitting: Internal Medicine

## 2016-01-28 NOTE — Telephone Encounter (Signed)
Left refills on voice mail at pharmacy

## 2016-01-28 NOTE — Telephone Encounter (Signed)
Tramadol #90 and Diazepam #60 both last filled 12-29-15  Last ov 12-22-15 Next ov 03-24-16

## 2016-01-28 NOTE — Telephone Encounter (Signed)
Patient called this pm to verify R/X's called in.  This Probation officer confirms R/X's sent in with patient.

## 2016-01-28 NOTE — Telephone Encounter (Signed)
Approved: okay for same quantities of both x 0

## 2016-02-01 ENCOUNTER — Other Ambulatory Visit: Payer: Self-pay | Admitting: Internal Medicine

## 2016-02-02 NOTE — Telephone Encounter (Signed)
Pt left v/m; pt said she does not have any more refills of fluoxetine; pt thinks what she was doing was taking the fluoxetine with cymbalta in the mornings and pt was taking an extra fluoxetine when she took the cymbalta in the afternoon. Pt request cb.

## 2016-02-12 ENCOUNTER — Telehealth: Payer: Self-pay

## 2016-02-12 NOTE — Telephone Encounter (Signed)
Pt request diabetic meter,test strips and lancets to [facility] [address]. I advised pt to contact ins co to find out want diabetic supplies are covered under pts plan and pt will cb with info.

## 2016-02-15 ENCOUNTER — Other Ambulatory Visit: Payer: Self-pay | Admitting: Internal Medicine

## 2016-02-17 ENCOUNTER — Other Ambulatory Visit: Payer: Self-pay

## 2016-02-17 MED ORDER — ACCU-CHEK AVIVA PLUS W/DEVICE KIT: PACK | 0 refills | Status: DC

## 2016-02-17 MED ORDER — ACCU-CHEK SOFT TOUCH LANCETS MISC: 12 refills | Status: DC

## 2016-02-17 MED ORDER — GLUCOSE BLOOD VI STRP: ORAL_STRIP | 12 refills | Status: DC

## 2016-02-17 NOTE — Telephone Encounter (Signed)
Patient returned call and requests Accu check diabetic supplies.  (refer to med refill note for details).   In addition, she would like for Dr. Silvio Pate to be made aware that she found her Fluoxetine bottle. Daughter found under her bed and she apologizes for any trouble that caused.

## 2016-02-17 NOTE — Telephone Encounter (Signed)
Patient requesting orders be sent to pharmacy for Accucheck meter, strips and lancets.  Orders approved.

## 2016-02-17 NOTE — Telephone Encounter (Signed)
Pt left v/m requesting cb about diabetic supplies. Left v/m requesting pt to cb.

## 2016-02-18 NOTE — Telephone Encounter (Signed)
Okay to approve supplies  Glad to hear the med was found

## 2016-02-18 NOTE — Telephone Encounter (Signed)
It looks like the supplies were sent out yesterday

## 2016-02-26 ENCOUNTER — Telehealth: Payer: Self-pay | Admitting: Internal Medicine

## 2016-02-26 NOTE — Telephone Encounter (Signed)
Px written for call in   Will refill once in pcp absence

## 2016-02-26 NOTE — Telephone Encounter (Signed)
Both last filled 01-28-16  Diazepam #60 Tramadol #90  Last OV 12-22-15 Next OV 03-24-16  Forwarding to Dr Glori Bickers in Dr Alla German absence

## 2016-02-26 NOTE — Telephone Encounter (Signed)
Rx called in as prescribed and pt notified  

## 2016-02-26 NOTE — Telephone Encounter (Signed)
Please call patient when rx is called in to [facility] on [address].  Patient can be reached at [phone].

## 2016-03-04 ENCOUNTER — Other Ambulatory Visit: Payer: Self-pay | Admitting: Internal Medicine

## 2016-03-16 ENCOUNTER — Other Ambulatory Visit: Payer: Self-pay | Admitting: Internal Medicine

## 2016-03-17 LAB — HM DIABETES EYE EXAM

## 2016-03-23 ENCOUNTER — Encounter: Payer: Self-pay | Admitting: Internal Medicine

## 2016-03-24 ENCOUNTER — Encounter: Payer: Medicare PPO | Admitting: Internal Medicine

## 2016-03-24 ENCOUNTER — Other Ambulatory Visit: Payer: Self-pay | Admitting: Internal Medicine

## 2016-03-25 ENCOUNTER — Other Ambulatory Visit: Payer: Self-pay | Admitting: Family Medicine

## 2016-03-25 NOTE — Telephone Encounter (Signed)
Pt left v/m requesting refill tramadol (last refilled # 90 on 02/26/16) and diazepam (last refilled # 60 on 02/26/16)the patient last seen 02/26/15 for annual and has annual exam scheduled for 05/06/16. Pt requesting few days early due to long weekend and [place] holiday.

## 2016-03-25 NOTE — Telephone Encounter (Signed)
Left refill on voice mail at pharmacy  

## 2016-03-25 NOTE — Telephone Encounter (Signed)
Approved: #90 x 0 of tramadol #60 x 0 of diazepam

## 2016-04-08 ENCOUNTER — Other Ambulatory Visit: Payer: Self-pay | Admitting: Internal Medicine

## 2016-04-10 ENCOUNTER — Other Ambulatory Visit: Payer: Self-pay | Admitting: Internal Medicine

## 2016-04-18 ENCOUNTER — Other Ambulatory Visit: Payer: Self-pay | Admitting: Internal Medicine

## 2016-04-22 ENCOUNTER — Other Ambulatory Visit: Payer: Self-pay | Admitting: Internal Medicine

## 2016-04-22 NOTE — Telephone Encounter (Signed)
Diazepam last filled 03-25-16 #60 Tramadol last filled 03-25-16 #50  Last OV 12-22-15 Next OV  05-06-16  Forwarding to Dr Glori Bickers in Dr Everardo Beals absence

## 2016-04-22 NOTE — Telephone Encounter (Signed)
Rxs called in as prescribed

## 2016-04-22 NOTE — Telephone Encounter (Signed)
Please refill times one in PCP absence   Px written for call in

## 2016-05-06 ENCOUNTER — Encounter: Payer: Self-pay | Admitting: Internal Medicine

## 2016-05-06 ENCOUNTER — Ambulatory Visit (INDEPENDENT_AMBULATORY_CARE_PROVIDER_SITE_OTHER): Payer: Medicare Other | Admitting: Internal Medicine

## 2016-05-06 VITALS — BP 140/68 | HR 60 | Temp 99.1°F | Ht 65.0 in | Wt 167.0 lb

## 2016-05-06 DIAGNOSIS — Z7189 Other specified counseling: Secondary | ICD-10-CM | POA: Diagnosis not present

## 2016-05-06 DIAGNOSIS — Z23 Encounter for immunization: Secondary | ICD-10-CM | POA: Diagnosis not present

## 2016-05-06 DIAGNOSIS — I10 Essential (primary) hypertension: Secondary | ICD-10-CM | POA: Diagnosis not present

## 2016-05-06 DIAGNOSIS — Z0000 Encounter for general adult medical examination without abnormal findings: Secondary | ICD-10-CM | POA: Diagnosis not present

## 2016-05-06 DIAGNOSIS — F39 Unspecified mood [affective] disorder: Secondary | ICD-10-CM

## 2016-05-06 DIAGNOSIS — H8113 Benign paroxysmal vertigo, bilateral: Secondary | ICD-10-CM | POA: Diagnosis not present

## 2016-05-06 DIAGNOSIS — E1165 Type 2 diabetes mellitus with hyperglycemia: Secondary | ICD-10-CM

## 2016-05-06 DIAGNOSIS — E1140 Type 2 diabetes mellitus with diabetic neuropathy, unspecified: Secondary | ICD-10-CM

## 2016-05-06 DIAGNOSIS — IMO0002 Reserved for concepts with insufficient information to code with codable children: Secondary | ICD-10-CM

## 2016-05-06 LAB — HM DIABETES FOOT EXAM

## 2016-05-06 LAB — LIPID PANEL
CHOLESTEROL: 180 mg/dL (ref 0–200)
HDL: 35.5 mg/dL — ABNORMAL LOW (ref >39.00)
NonHDL: 144.9
Total CHOL/HDL Ratio: 5
Triglycerides: 376 mg/dL — ABNORMAL HIGH (ref 0.0–149.0)
VLDL: 75.2 mg/dL — AB (ref 0.0–40.0)

## 2016-05-06 LAB — CBC WITH DIFFERENTIAL/PLATELET
BASOS PCT: 0.8 % (ref 0.0–3.0)
Basophils Absolute: 0.1 10*3/uL (ref 0.0–0.1)
EOS ABS: 0.3 10*3/uL (ref 0.0–0.7)
Eosinophils Relative: 3 % (ref 0.0–5.0)
HCT: 39.5 % (ref 36.0–46.0)
Hemoglobin: 13.3 g/dL (ref 12.0–15.0)
LYMPHS ABS: 2.1 10*3/uL (ref 0.7–4.0)
Lymphocytes Relative: 19.8 % (ref 12.0–46.0)
MCHC: 33.8 g/dL (ref 30.0–36.0)
MCV: 89.8 fl (ref 78.0–100.0)
MONO ABS: 0.7 10*3/uL (ref 0.1–1.0)
Monocytes Relative: 6.9 % (ref 3.0–12.0)
NEUTROS PCT: 69.5 % (ref 43.0–77.0)
Neutro Abs: 7.5 10*3/uL (ref 1.4–7.7)
Platelets: 323 10*3/uL (ref 150.0–400.0)
RBC: 4.4 Mil/uL (ref 3.87–5.11)
RDW: 12.4 % (ref 11.5–15.5)
WBC: 10.8 10*3/uL — AB (ref 4.0–10.5)

## 2016-05-06 LAB — COMPREHENSIVE METABOLIC PANEL
ALBUMIN: 4.2 g/dL (ref 3.5–5.2)
ALT: 11 U/L (ref 0–35)
AST: 14 U/L (ref 0–37)
Alkaline Phosphatase: 87 U/L (ref 39–117)
BUN: 16 mg/dL (ref 6–23)
CO2: 29 mEq/L (ref 19–32)
CREATININE: 0.91 mg/dL (ref 0.40–1.20)
Calcium: 9.3 mg/dL (ref 8.4–10.5)
Chloride: 101 mEq/L (ref 96–112)
GFR: 63.65 mL/min (ref >60.00)
GLUCOSE: 241 mg/dL — AB (ref 70–99)
Potassium: 4 mEq/L (ref 3.5–5.1)
SODIUM: 137 meq/L (ref 135–145)
TOTAL PROTEIN: 7.2 g/dL (ref 6.0–8.3)
Total Bilirubin: 0.3 mg/dL (ref 0.2–1.2)

## 2016-05-06 LAB — HEMOGLOBIN A1C: HEMOGLOBIN A1C: 7.3 % — AB (ref 4.6–6.5)

## 2016-05-06 LAB — T4, FREE: FREE T4: 1 ng/dL (ref 0.60–1.60)

## 2016-05-06 LAB — LDL CHOLESTEROL, DIRECT: LDL DIRECT: 78 mg/dL

## 2016-05-06 MED ORDER — MECLIZINE HCL 25 MG PO TABS: 25.0000 mg | ORAL_TABLET | Freq: Three times a day (TID) | ORAL | 3 refills | Status: DC | PRN

## 2016-05-06 NOTE — Progress Notes (Signed)
Pre visit review using our clinic review tool, if applicable. No additional management support is needed unless otherwise documented below in the visit note. 

## 2016-05-06 NOTE — Progress Notes (Signed)
Subjective:    Patient ID: Jaclyn Peterson, female    DOB: Feb 27, 1941, 76 y.o.   MRN: [id number]  HPI Here for Medicare wellness visit and follow up of chronic health conditions With daughter Reviewed form and advanced directives Reviewed other doctors No alcohol Still smokes---but very few. Discussed Not exercising--due to the vertigo Vision is poor---cataract excision is planned Poor hearing--she isn't ready for aide 1 fall--- no injury Chronic mood problems Independent with instrumental ADLs--not driving now with the dizziness Mild short term memory problems  Having vertigo  Some nausea with it Meclizine helps  Sugars had been better 110-120--but was 220 this AM Same sensation loss in feet--cymbalta still seems to help No hypoglycemia  Mood has been good Still needs the valium regularly--for nerves but mostly for muscle spasms Not feeling depressed --not anhedonic  Occasionally checks BP 130-140 HR low with inderal No chest pain or SOB No syncope No palpitations No edema  Current Outpatient Prescriptions on File Prior to Visit  Medication Sig Dispense Refill  . amLODipine (NORVASC) 10 MG tablet TAKE 1 TABLET(10 MG) BY MOUTH DAILY 90 tablet 3  . aspirin 81 MG tablet Take 81 mg by mouth daily.      . Blood Glucose Monitoring Suppl (ACCU-CHEK AVIVA PLUS) w/Device KIT Test sugar once daily as directed. Dx code: E11.40 1 kit 0  . Calcium Carb-Cholecalciferol (OS-CAL 500 + D) 500-600 MG-UNIT TABS Take by mouth daily.      . cholestyramine (QUESTRAN) 4 g packet MIX AND DRINK 1 PACKET BY MOUTH DAILY AS DIRECTED 60 packet 5  . diazepam (VALIUM) 2 MG tablet TAKE 1 TABLET BY MOUTH TWICE DAILY AS NEEDED 60 tablet 0  . DULoxetine (CYMBALTA) 60 MG capsule TAKE 1 CAPSULE BY MOUTH TWICE DAILY 180 capsule 0  . FLUoxetine (PROZAC) 40 MG capsule TAKE 1 CAPSULE BY MOUTH EVERY DAY 90 capsule 0  . glipiZIDE (GLUCOTROL) 5 MG tablet TAKE 1 TABLET BY MOUTH TWICE DAILY BEFORE A  MEAL 180 tablet 3  . glucose blood test strip Use as instructed.  Dx code:  E11.40. 100 each 12  . Lancets (ACCU-CHEK SOFT TOUCH) lancets Use as instructed.  Dx code:  E11.40 100 each 12  . losartan (COZAAR) 25 MG tablet TAKE 1 TABLET(25 MG) BY MOUTH DAILY 90 tablet 3  . metFORMIN (GLUCOPHAGE-XR) 500 MG 24 hr tablet TAKE 4 TABLETS BY MOUTH EVERY DAY WITH BREAKFAST 120 tablet 5  . propranolol (INDERAL) 20 MG tablet TAKE 1 TABLET BY MOUTH TWICE DAILY 180 tablet 1  . senna-docusate (SENOKOT-S) 8.6-50 MG per tablet Take 1 tablet by mouth 2 (two) times daily.      . simvastatin (ZOCOR) 20 MG tablet TAKE 1 TABLET(20 MG) BY MOUTH DAILY 90 tablet 3  . sitaGLIPtin (JANUVIA) 100 MG tablet Take 1 tablet (100 mg total) by mouth daily. 30 tablet 11  . tiZANidine (ZANAFLEX) 2 MG tablet TAKE 1 TABLET BY MOUTH TWICE DAILY AS NEEDED FOR MUSCLE SPASMS 180 tablet 3  . traMADol (ULTRAM) 50 MG tablet TAKE 1 TABLET BY MOUTH THREE TIMES DAILY AS NEEDED FOR PAIN 90 tablet 0   No current facility-administered medications on file prior to visit.     Allergies  Allergen Reactions  . Bisoprolol Fumarate     REACTION: ha  . Chlorthalidone   . Lisinopril     REACTION: throat symptoms  . Sulfa Antibiotics     Told by mother as a child  . Tetracyclines & Related Swelling  Throat swelling    Past Medical History:  Diagnosis Date  . Chronic pain   . Depression   . Diabetes mellitus   . Falls frequently    Falls/pneumonia 1/12  . Hyperlipidemia   . Hypertension   . Osteoporosis   . SBO (small bowel obstruction)    obstipation  10/2005  . Uterine cancer ([place])    P32 INSERT    Past Surgical History:  Procedure Laterality Date  . ABDOMINAL HYSTERECTOMY     BSO 1985  cancer  . APPENDECTOMY     1946  Adhesions shortly after  . BACK SURGERY  1970's  . BLADDER REPAIR     bladder tacking- chronic pain since  1998  . CATARACT EXTRACTION  12/12   left and then laser---Dr Tobe Sos  . CHOLECYSTECTOMY      lysis of adhesions--post op complications, trach, etc  8/10  . KIDNEY STONE SURGERY     cysto-- [facility] 2003    Family History  Problem Relation Age of Onset  . Coronary artery disease Father   . Hypertension Neg Hx   . Diabetes Neg Hx     Social History   Social History  . Marital status: Married    Spouse name: N/A  . Number of children: 2  . Years of education: N/A   Occupational History  . 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 Smoking status: Current Some Day Smoker  . Smokeless tobacco: Never Used     Comment: only once in a while. Gave info on 1-800-QUIT NOW  . Alcohol use No  . Drug use: No  . Sexual activity: Not on file   Other Topics Concern  . Not on file   Social History Narrative   Enjoys crafts      No living will   Daughter, SIL, kids living with her know   Son [name] and daughter [name] should make health care decisions if POA needed   Would accept resuscitation attempts but no prolonged artificial ventilation   No feeding tubes if cognitively unaware            Review of Systems Pulled back out recently--picking up her dog Appetite is good Weight is stable Some trouble initiating sleep-- then does okay Wears seat belt Full dentures No joint swelling or pain Bowels are okay---formed with the cholestyramine. No blood Voids okay but slow stream. Some urge incontinence--pad at night Has some bumps on back    Objective:   Physical Exam  Constitutional: She is oriented to person, place, and time. She appears well-nourished. No distress.  HENT:  Mouth/Throat: Oropharynx is clear and moist. No oropharyngeal exudate.  Neck: No thyromegaly present.  Cardiovascular: Normal rate, regular rhythm and normal heart sounds.  Exam reveals no gallop.   No murmur heard. Faint pedal pulses  Pulmonary/Chest: Effort normal and breath sounds normal. No respiratory distress. She has no wheezes. She has no rales.    Abdominal: Soft. There is no tenderness.  Musculoskeletal: She exhibits no edema or tenderness.  Lymphadenopathy:    She has no cervical adenopathy.  Neurological: She is alert and oriented to person, place, and time.  President--- "Sheela Stack---- Obama" [id number] Recall 3/3  Decreased sensation in feet  Skin: No rash noted.  No foot lesions Lots of seborrheic keratoses  Psychiatric: She has a normal mood and affect. Her behavior is normal.          Assessment &  Plan:

## 2016-05-06 NOTE — Assessment & Plan Note (Signed)
Not sure about her control though recent sugars better Discussed that she would need injections next---she does have room to improve with eating

## 2016-05-06 NOTE — Assessment & Plan Note (Signed)
Chronic anxiety Doing okay on current meds

## 2016-05-06 NOTE — Addendum Note (Signed)
Addended by: Pilar Grammes on: 05/06/2016 01:22 PM   Modules accepted: Orders

## 2016-05-06 NOTE — Assessment & Plan Note (Signed)
See social history Forms given

## 2016-05-06 NOTE — Assessment & Plan Note (Signed)
Intermittent but persistent

## 2016-05-06 NOTE — Assessment & Plan Note (Signed)
I have personally reviewed the Medicare Annual Wellness questionnaire and have noted 1. The patient's medical and social history 2. Their use of alcohol, tobacco or illicit drugs 3. Their current medications and supplements 4. The patient's functional ability including ADL's, fall risks, home safety risks and hearing or visual             impairment. 5. Diet and physical activities 6. Evidence for depression or mood disorders  The patients weight, height, BMI and visual acuity have been recorded in the chart I have made referrals, counseling and provided education to the patient based review of the above and I have provided the pt with a written personalized care plan for preventive services.  I have provided you with a copy of your personalized plan for preventive services. Please take the time to review along with your updated medication list.  Will take flu vaccine--- discussed getting in October Discussed exercising again and giving up the last of the cigarettes No cancer screening due to ago

## 2016-05-06 NOTE — Assessment & Plan Note (Signed)
BP Readings from Last 3 Encounters:  05/06/16 140/68  12/22/15 140/80  11/17/15 (!) 182/78   Borderline No change for now with her regular dizziness

## 2016-05-11 ENCOUNTER — Other Ambulatory Visit: Payer: Self-pay | Admitting: Internal Medicine

## 2016-05-20 ENCOUNTER — Other Ambulatory Visit: Payer: Self-pay | Admitting: Family Medicine

## 2016-05-20 NOTE — Telephone Encounter (Signed)
Pt has f/u with Dr. Silvio Pate on 11/03/16, last filled on 04/22/16 #60 tabs with 0 refills, Dr. Silvio Pate out of the office please advise

## 2016-05-20 NOTE — Telephone Encounter (Signed)
Px written for call in   Refill times one in pcp abs

## 2016-05-20 NOTE — Telephone Encounter (Signed)
Rx called in as prescribed 

## 2016-05-20 NOTE — Telephone Encounter (Signed)
Last filled on 04/22/16 #90 with 0 refills, pt has f/u with Dr. Silvio Pate on 11/03/16, Dr. Silvio Pate out of the office, please advise

## 2016-05-20 NOTE — Telephone Encounter (Signed)
Px written for call in   Refill once in pcp absence

## 2016-06-01 ENCOUNTER — Encounter: Payer: Self-pay | Admitting: *Deleted

## 2016-06-02 ENCOUNTER — Other Ambulatory Visit: Payer: Self-pay | Admitting: Internal Medicine

## 2016-06-07 ENCOUNTER — Ambulatory Visit: Payer: Medicare Other | Admitting: Anesthesiology

## 2016-06-07 ENCOUNTER — Encounter
Admission: RE | Disposition: A | Discharge status: Home / Self Care | Payer: Self-pay | Source: Ambulatory Visit | Attending: Ophthalmology

## 2016-06-07 ENCOUNTER — Ambulatory Visit
Admission: RE | Admit: 2016-06-07 | Discharge: 2016-06-07 | Disposition: A | Discharge status: Home / Self Care | Payer: Medicare Other | Source: Ambulatory Visit | Attending: Ophthalmology | Admitting: Ophthalmology

## 2016-06-07 DIAGNOSIS — F17200 Nicotine dependence, unspecified, uncomplicated: Secondary | ICD-10-CM | POA: Diagnosis not present

## 2016-06-07 DIAGNOSIS — I10 Essential (primary) hypertension: Secondary | ICD-10-CM | POA: Insufficient documentation

## 2016-06-07 DIAGNOSIS — E1136 Type 2 diabetes mellitus with diabetic cataract: Secondary | ICD-10-CM | POA: Insufficient documentation

## 2016-06-07 DIAGNOSIS — R51 Headache: Secondary | ICD-10-CM | POA: Insufficient documentation

## 2016-06-07 HISTORY — DX: Migraine, unspecified, not intractable, without status migrainosus: G43.909

## 2016-06-07 HISTORY — DX: Polyneuropathy, unspecified: G62.9

## 2016-06-07 HISTORY — DX: Disease of pericardium, unspecified: I31.9

## 2016-06-07 HISTORY — DX: Celiac disease: K90.0

## 2016-06-07 HISTORY — PX: CATARACT EXTRACTION W/PHACO: SHX586

## 2016-06-07 HISTORY — DX: Pneumonia, unspecified organism: J18.9

## 2016-06-07 HISTORY — DX: Dizziness and giddiness: R42

## 2016-06-07 LAB — GLUCOSE, CAPILLARY: GLUCOSE-CAPILLARY: 187 mg/dL — AB (ref 65–99)

## 2016-06-07 SURGERY — PHACOEMULSIFICATION, CATARACT, WITH IOL INSERTION
Anesthesia: Monitor Anesthesia Care | Site: Eye | Laterality: Right | Wound class: Clean

## 2016-06-07 MED ORDER — LIDOCAINE HCL (PF) 4 % IJ SOLN
INTRAOCULAR | Status: DC | PRN
  Administered 2016-06-07: 2 mL via OPHTHALMIC

## 2016-06-07 MED ORDER — MIDAZOLAM HCL 2 MG/2ML IJ SOLN
INTRAMUSCULAR | Status: DC | PRN
  Administered 2016-06-07: 0.5 mg via INTRAVENOUS

## 2016-06-07 MED ORDER — EPINEPHRINE PF 1 MG/ML IJ SOLN
INTRAMUSCULAR | Status: AC
  Filled 2016-06-07: qty 2

## 2016-06-07 MED ORDER — ARMC OPHTHALMIC DILATING DROPS
1.0000 "application " | OPHTHALMIC | Status: AC
  Administered 2016-06-07 (×3): 1 via OPHTHALMIC

## 2016-06-07 MED ORDER — EPINEPHRINE PF 1 MG/ML IJ SOLN
INTRAMUSCULAR | Status: DC | PRN
  Administered 2016-06-07: 1 mL via OPHTHALMIC

## 2016-06-07 MED ORDER — CARBACHOL 0.01 % IO SOLN
INTRAOCULAR | Status: DC | PRN
  Administered 2016-06-07: .5 mL via INTRAOCULAR

## 2016-06-07 MED ORDER — MIDAZOLAM HCL 2 MG/2ML IJ SOLN
INTRAMUSCULAR | Status: AC
  Filled 2016-06-07: qty 2

## 2016-06-07 MED ORDER — NA CHONDROIT SULF-NA HYALURON 40-17 MG/ML IO SOLN
INTRAOCULAR | Status: AC
  Filled 2016-06-07: qty 1

## 2016-06-07 MED ORDER — FENTANYL CITRATE (PF) 100 MCG/2ML IJ SOLN
INTRAMUSCULAR | Status: AC
  Filled 2016-06-07: qty 2

## 2016-06-07 MED ORDER — MOXIFLOXACIN HCL 0.5 % OP SOLN: 1.0000 [drp] | OPHTHALMIC | Status: DC | PRN

## 2016-06-07 MED ORDER — NA CHONDROIT SULF-NA HYALURON 40-17 MG/ML IO SOLN
INTRAOCULAR | Status: DC | PRN
  Administered 2016-06-07: 1 mL via INTRAOCULAR

## 2016-06-07 MED ORDER — FENTANYL CITRATE (PF) 100 MCG/2ML IJ SOLN
INTRAMUSCULAR | Status: DC | PRN
  Administered 2016-06-07: 25 ug via INTRAVENOUS

## 2016-06-07 MED ORDER — SODIUM CHLORIDE 0.9 % IV SOLN
INTRAVENOUS | Status: DC
Start: 2016-06-07 — End: 2016-06-07
  Administered 2016-06-07: 10:00:00 via INTRAVENOUS

## 2016-06-07 MED ORDER — MOXIFLOXACIN HCL 0.5 % OP SOLN
OPHTHALMIC | Status: DC | PRN
  Administered 2016-06-07: .2 mL via OPHTHALMIC

## 2016-06-07 MED ORDER — POVIDONE-IODINE 5 % OP SOLN
OPHTHALMIC | Status: AC
  Filled 2016-06-07: qty 30

## 2016-06-07 MED ORDER — ARMC OPHTHALMIC DILATING DROPS: 1.0000 "application " | OPHTHALMIC | Status: AC

## 2016-06-07 SURGICAL SUPPLY — 22 items
CANNULA ANT/CHMB 27GA (MISCELLANEOUS) ×3 IMPLANT
CUP MEDICINE 2OZ PLAST GRAD ST (MISCELLANEOUS) ×3 IMPLANT
GLOVE BIO SURGEON STRL SZ8 (GLOVE) ×3 IMPLANT
GLOVE BIOGEL M 6.5 STRL (GLOVE) ×3 IMPLANT
GLOVE SURG LX 8.0 MICRO (GLOVE) ×2
GLOVE SURG LX STRL 8.0 MICRO (GLOVE) ×1 IMPLANT
GOWN STRL REUS W/ TWL LRG LVL3 (GOWN DISPOSABLE) ×2 IMPLANT
GOWN STRL REUS W/TWL LRG LVL3 (GOWN DISPOSABLE) ×6
LENS IOL ACRSF IQ ULTRA 14.0 (Intraocular Lens) IMPLANT
LENS IOL ACRYSOF IQ 14.0 (Intraocular Lens) ×3 IMPLANT
PACK CATARACT (MISCELLANEOUS) ×3 IMPLANT
PACK CATARACT BRASINGTON LX (MISCELLANEOUS) ×3 IMPLANT
PACK EYE AFTER SURG (MISCELLANEOUS) ×3 IMPLANT
SOL BSS BAG (MISCELLANEOUS) ×3
SOL PREP PVP 2OZ (MISCELLANEOUS) ×3
SOLUTION BSS BAG (MISCELLANEOUS) ×1 IMPLANT
SOLUTION PREP PVP 2OZ (MISCELLANEOUS) ×1 IMPLANT
SYR 3ML LL SCALE MARK (SYRINGE) ×3 IMPLANT
SYR 5ML LL (SYRINGE) ×3 IMPLANT
SYR TB 1ML 27GX1/2 LL (SYRINGE) ×3 IMPLANT
WATER STERILE IRR 250ML POUR (IV SOLUTION) ×3 IMPLANT
WIPE NON LINTING 3.25X3.25 (MISCELLANEOUS) ×3 IMPLANT

## 2016-06-07 NOTE — H&P (Signed)
All labs reviewed. Abnormal studies sent to patients PCP when indicated.  Previous H&P reviewed, patient examined, there are NO CHANGES.  Jaclyn Peterson LOUIS3/27/201811:15 AM

## 2016-06-07 NOTE — Op Note (Signed)
PREOPERATIVE DIAGNOSIS:  Nuclear sclerotic cataract of the right eye.   POSTOPERATIVE DIAGNOSIS:  nuclear sclerotic cataract right eye   OPERATIVE PROCEDURE: Procedure(s): CATARACT EXTRACTION PHACO AND INTRAOCULAR LENS PLACEMENT (IOC)   SURGEON:  Birder Robson, MD.   ANESTHESIA:  Anesthesiologist: Molli Barrows, MD CRNA: Aline Brochure, CRNA; Darlyne Russian, CRNA  1.      Managed anesthesia care. 2.      0.46m of Shugarcaine was instilled in the eye following the paracentesis.   COMPLICATIONS:  None.   TECHNIQUE:   Stop and chop   DESCRIPTION OF PROCEDURE:  The patient was examined and consented in the preoperative holding area where the aforementioned topical anesthesia was applied to the right eye and then brought back to the Operating Room where the right eye was prepped and draped in the usual sterile ophthalmic fashion and a lid speculum was placed. A paracentesis was created with the side port blade and the anterior chamber was filled with viscoelastic. A near clear corneal incision was performed with the steel keratome. A continuous curvilinear capsulorrhexis was performed with a cystotome followed by the capsulorrhexis forceps. Hydrodissection and hydrodelineation were carried out with BSS on a blunt cannula. The lens was removed in a stop and chop  technique and the remaining cortical material was removed with the irrigation-aspiration handpiece. The capsular bag was inflated with viscoelastic and the Technis [id number]  lens was placed in the capsular bag without complication. The remaining viscoelastic was removed from the eye with the irrigation-aspiration handpiece. The wounds were hydrated. The anterior chamber was flushed with Miostat and the eye was inflated to physiologic pressure. 0.113mof Vigamox was placed in the anterior chamber. The wounds were found to be water tight. The eye was dressed with Vigamox. The patient was given protective glasses to wear throughout the day and a  shield with which to sleep tonight. The patient was also given drops with which to begin a drop regimen today and will follow-up with me in one day.  Implant Name Type Inv. Item Serial No. Manufacturer Lot No. LRB No. Used  LENS IOL ACRYSOF IQ 14.0 - [id number] Intraocular Lens LENS IOL ACRYSOF IQ 14.0 [id number] ALCON   Right 1   Procedure(s) with comments: CATARACT EXTRACTION PHACO AND INTRAOCULAR LENS PLACEMENT (IOC) (Right) - [place]:43 AP% 20.6 CDE 8.96 Fluid pack lot # [id number]  Electronically signed: PORandolph/27/2018 11:43 AM

## 2016-06-07 NOTE — Anesthesia Postprocedure Evaluation (Signed)
Anesthesia Post Note  Patient: Jaclyn Peterson  Procedure(s) Performed: Procedure(s) (LRB): CATARACT EXTRACTION PHACO AND INTRAOCULAR LENS PLACEMENT (IOC) (Right)  Patient location during evaluation: PACU Anesthesia Type: MAC Level of consciousness: awake and alert and oriented Pain management: pain level controlled Vital Signs Assessment: post-procedure vital signs reviewed and stable Respiratory status: spontaneous breathing Cardiovascular status: stable Anesthetic complications: no     Last Vitals:  Vitals:   06/07/16 1019 06/07/16 1147  BP: (!) 179/65 (!) 145/106  Pulse:  (!) 55  Resp:  12  Temp:  36.7 C    Last Pain:  Vitals:   06/07/16 1147  TempSrc: Oral                 Estill Batten

## 2016-06-07 NOTE — Discharge Instructions (Signed)
Eye Surgery Discharge Instructions  Expect mild scratchy sensation or mild soreness. DO NOT RUB YOUR EYE!  The day of surgery:  Minimal physical activity, but bed rest is not required  No reading, computer work, or close hand work  No bending, lifting, or straining.  May watch TV  For 24 hours:  No driving, legal decisions, or alcoholic beverages  Safety precautions  Eat anything you prefer: It is better to start with liquids, then soup then solid foods.  _____ Eye patch should be worn until postoperative exam tomorrow.  ____ Solar shield eyeglasses should be worn for comfort in the sunlight/patch while sleeping  Resume all regular medications including aspirin or Coumadin if these were discontinued prior to surgery. You may shower, bathe, shave, or wash your hair. Tylenol may be taken for mild discomfort.  Call your doctor if you experience significant pain, nausea, or vomiting, fever > 101 or other signs of infection. [phone] or [phone] Specific instructions:  Follow-up Information    PORFILIO,WILLIAM LOUIS, MD Follow up on 06/08/2016.   Specialty:  Ophthalmology Why:  10:00 Contact information: [address] [place] [address] [phone]

## 2016-06-07 NOTE — Anesthesia Post-op Follow-up Note (Cosign Needed)
Anesthesia QCDR form completed.        

## 2016-06-07 NOTE — Anesthesia Preprocedure Evaluation (Signed)
Anesthesia Evaluation  Patient identified by MRN, date of birth, ID band Patient awake    Reviewed: Allergy & Precautions, H&P , NPO status , Patient's Chart, lab work & pertinent test results, reviewed documented beta blocker date and time   Airway Mallampati: II  TM Distance: >3 FB Neck ROM: full    Dental no notable dental hx. (+) Teeth Intact   Pulmonary neg pulmonary ROS, pneumonia, resolved, Current Smoker,    Pulmonary exam normal breath sounds clear to auscultation       Cardiovascular Exercise Tolerance: Good hypertension, negative cardio ROS   Rhythm:regular Rate:Normal     Neuro/Psych  Headaches, PSYCHIATRIC DISORDERS negative neurological ROS  negative psych ROS   GI/Hepatic negative GI ROS, Neg liver ROS,   Endo/Other  negative endocrine ROSdiabetes  Renal/GU      Musculoskeletal   Abdominal   Peds  Hematology negative hematology ROS (+)   Anesthesia Other Findings   Reproductive/Obstetrics negative OB ROS                             Anesthesia Physical Anesthesia Plan  ASA: III  Anesthesia Plan: MAC   Post-op Pain Management:    Induction:   Airway Management Planned:   Additional Equipment:   Intra-op Plan:   Post-operative Plan:   Informed Consent: I have reviewed the patients History and Physical, chart, labs and discussed the procedure including the risks, benefits and alternatives for the proposed anesthesia with the patient or authorized representative who has indicated his/her understanding and acceptance.     Plan Discussed with: CRNA  Anesthesia Plan Comments:         Anesthesia Quick Evaluation

## 2016-06-07 NOTE — Transfer of Care (Signed)
Immediate Anesthesia Transfer of Care Note  Patient: Jaclyn Peterson  Procedure(s) Performed: Procedure(s) with comments: CATARACT EXTRACTION PHACO AND INTRAOCULAR LENS PLACEMENT (IOC) (Right) - [place] 00:43 AP% 20.6 CDE 8.96 Fluid pack lot # [id number]  Patient Location: PACU  Anesthesia Type:MAC  Level of Consciousness: awake, alert  and oriented  Airway & Oxygen Therapy: Patient Spontanous Breathing  Post-op Assessment: Post -op Vital signs reviewed and stable  Post vital signs: stable  Last Vitals:  Vitals:   06/07/16 1019 06/07/16 1147  BP: (!) 179/65 (!) 145/106  Pulse:  (!) 55  Resp:  12  Temp:  36.7 C    Last Pain:  Vitals:   06/07/16 1147  TempSrc: Oral         Complications: No apparent anesthesia complications

## 2016-06-08 ENCOUNTER — Encounter: Payer: Self-pay | Admitting: Ophthalmology

## 2016-06-13 ENCOUNTER — Other Ambulatory Visit: Payer: Self-pay | Admitting: Internal Medicine

## 2016-06-14 NOTE — Telephone Encounter (Signed)
Approved: #[id number] x 0

## 2016-06-14 NOTE — Telephone Encounter (Signed)
Last filled 03-17-16 #180 LAst OV 05-06-16 Next OV 11-03-16

## 2016-06-17 ENCOUNTER — Other Ambulatory Visit: Payer: Self-pay | Admitting: Family Medicine

## 2016-06-17 ENCOUNTER — Other Ambulatory Visit: Payer: Self-pay | Admitting: Internal Medicine

## 2016-06-17 NOTE — Telephone Encounter (Signed)
Left refills on voice mail at pharmacy

## 2016-06-17 NOTE — Telephone Encounter (Signed)
Tramadol last filled 3/9 not 3/19.  plz phone both into pharmacy.

## 2016-06-17 NOTE — Telephone Encounter (Signed)
Pt left v/m requesting status of diazepam(last refilled # 60 on 05/20/16) and tramadol (last refilled # 90 on 05/30/16). Pt does not have enough med to last until Monday and request refill today; pt "is down in her back" last annual 05/06/16. Dr Silvio Pate out of office and not sure if on computer.

## 2016-06-20 ENCOUNTER — Telehealth: Payer: Self-pay

## 2016-06-20 NOTE — Telephone Encounter (Signed)
PLEASE NOTE: All timestamps contained within this report are represented as [place] Standard Time. CONFIDENTIALTY NOTICE: This fax transmission is intended only for the addressee. It contains information that is legally privileged, confidential or otherwise protected from use or disclosure. If you are not the intended recipient, you are strictly prohibited from reviewing, disclosing, copying using or disseminating any of this information or taking any action in reliance on or regarding this information. If you have received this fax in error, please notify us immediately by telephone so that we can arrange for its return to [place]. Phone: [phone], Toll-Free: [phone], Fax: [phone] Page: 1 of 1 Call Id: [id number] [place] Patient Name: Jaclyn Peterson Gender: Female DOB: 1940/10/03 Age: 76 Y 50 M 4 D Return Phone Number: [phone] (Primary) City/State/Zip: [place] [address] Client [facility] - Client Client Site [facility] - Day Physician Viviana Simpler - MD Who Is Calling Patient / Member / Family / Caregiver Call Type Triage / Clinical Relationship To Patient Self Return Phone Number [phone] (Primary) Chief Complaint Prescription Refill or Medication Request (non symptomatic) Reason for Call Medication Question / Request Initial Comment Caller states she needs a refill on her prescriptions Appointment Disposition EMR Appointment Not Necessary Info pasted into Epic No Nurse Assessment Guidelines Guideline Title Affirmed Question Disp. Time Eilene Ghazi Time) Disposition Final User 06/17/2016 6:40:19 PM Clinical Call Yes Tressia Danas, RN, Earnest Bailey Comments User: Dennard Nip, RN Date/Time Eilene Ghazi Time): 06/17/2016 6:40:07 PM caller states rx have been taken care of at time of call

## 2016-07-15 ENCOUNTER — Other Ambulatory Visit: Payer: Self-pay | Admitting: Internal Medicine

## 2016-07-15 NOTE — Telephone Encounter (Signed)
Approved:okay same quantities of each with no refills

## 2016-07-15 NOTE — Telephone Encounter (Signed)
Left refills on voice mail at pharmacy

## 2016-07-15 NOTE — Telephone Encounter (Signed)
Both last filled 06-17-16 Last OV 05-06-16 Next OV 11-03-16

## 2016-08-15 ENCOUNTER — Other Ambulatory Visit: Payer: Self-pay | Admitting: Internal Medicine

## 2016-08-15 NOTE — Telephone Encounter (Signed)
Both last filled 07-15-16 Last OV 05-06-16 Next OV 11-03-16 Diazepam #60 Tramadol #90  No UDS

## 2016-08-15 NOTE — Telephone Encounter (Signed)
Left refill on voice mail at pharmacy  

## 2016-08-15 NOTE — Telephone Encounter (Signed)
Spoke to pt

## 2016-08-15 NOTE — Telephone Encounter (Signed)
Approved: #90 x 0 of tramadol and #60 x 0 of diazepam UDS on next visit to office

## 2016-08-15 NOTE — Telephone Encounter (Signed)
Patient called to find out if rx is ready.  Patient can be reached at [phone].

## 2016-09-12 ENCOUNTER — Other Ambulatory Visit: Payer: Self-pay | Admitting: Internal Medicine

## 2016-09-12 NOTE — Telephone Encounter (Signed)
Ok to refill as requested 

## 2016-09-12 NOTE — Telephone Encounter (Signed)
Both last filled 08-15-16 Diazepam #60  Tramadol #90 Last OV?CPE 05-06-16 No Future OV  Forwarding to Dr Deborra Medina in Dr Alla German absence. Please send back to me when approved/denied. Thanks

## 2016-09-12 NOTE — Telephone Encounter (Signed)
Left refill on voice mail at pharmacy  

## 2016-09-13 ENCOUNTER — Other Ambulatory Visit: Payer: Self-pay | Admitting: Internal Medicine

## 2016-09-25 ENCOUNTER — Other Ambulatory Visit: Payer: Self-pay | Admitting: Internal Medicine

## 2016-10-10 ENCOUNTER — Other Ambulatory Visit: Payer: Self-pay | Admitting: Internal Medicine

## 2016-10-10 NOTE — Telephone Encounter (Signed)
Both last filled 09-12-16 Tramadol #90 Diazepam #60  Last OV 05-06-16 Next OV 11-03-16

## 2016-10-10 NOTE — Telephone Encounter (Signed)
Spoke to pt

## 2016-10-10 NOTE — Telephone Encounter (Signed)
Left message for pt to call back  °

## 2016-10-10 NOTE — Telephone Encounter (Signed)
Left refill on voice mail at pharmacy  

## 2016-10-10 NOTE — Telephone Encounter (Signed)
Approved: same amounts x 0 for each Let her know she can't call early again next month

## 2016-10-19 ENCOUNTER — Other Ambulatory Visit: Payer: Self-pay | Admitting: Internal Medicine

## 2016-11-03 ENCOUNTER — Ambulatory Visit: Payer: Medicare Other | Admitting: Internal Medicine

## 2016-11-08 ENCOUNTER — Other Ambulatory Visit: Payer: Self-pay | Admitting: Internal Medicine

## 2016-11-08 NOTE — Telephone Encounter (Signed)
Left refills on voice mail at pharmacy

## 2016-11-08 NOTE — Telephone Encounter (Signed)
Pt left v/m; pt fell and hurt her back and pt request refill tramadol(last refilled # 90 on 10/10/16) and diazepam(last refilled # 60 on 10/10/16) to [facility] [address] pts daughter can pick up meds today at pharmacy. Last annual 05/06/16.

## 2016-11-08 NOTE — Telephone Encounter (Signed)
Approved: okay #90 x 0 for tramadol and #60 x 0 for diazepam

## 2016-11-20 ENCOUNTER — Other Ambulatory Visit: Payer: Self-pay | Admitting: Internal Medicine

## 2016-11-22 ENCOUNTER — Other Ambulatory Visit: Payer: Self-pay | Admitting: Internal Medicine

## 2016-12-08 ENCOUNTER — Other Ambulatory Visit: Payer: Self-pay | Admitting: Internal Medicine

## 2016-12-08 NOTE — Telephone Encounter (Signed)
Both last filled 11-08-16 Diazepam #60 Tramadol #90  Last OV 05-06-16 Next OV 12-26-16

## 2016-12-08 NOTE — Telephone Encounter (Signed)
Approved: okay same quantities x 0

## 2016-12-08 NOTE — Telephone Encounter (Signed)
Left refills on voice mail at pharmacy

## 2016-12-09 ENCOUNTER — Ambulatory Visit: Payer: Medicare Other | Admitting: Internal Medicine

## 2016-12-09 ENCOUNTER — Other Ambulatory Visit: Payer: Self-pay | Admitting: Internal Medicine

## 2016-12-15 ENCOUNTER — Encounter: Payer: Self-pay | Admitting: Internal Medicine

## 2016-12-15 ENCOUNTER — Ambulatory Visit (INDEPENDENT_AMBULATORY_CARE_PROVIDER_SITE_OTHER): Payer: Medicare Other | Admitting: Internal Medicine

## 2016-12-15 ENCOUNTER — Telehealth: Payer: Self-pay | Admitting: Internal Medicine

## 2016-12-15 VITALS — BP 134/76 | HR 55 | Temp 98.3°F | Wt 155.0 lb

## 2016-12-15 DIAGNOSIS — Z23 Encounter for immunization: Secondary | ICD-10-CM | POA: Diagnosis not present

## 2016-12-15 DIAGNOSIS — E1142 Type 2 diabetes mellitus with diabetic polyneuropathy: Secondary | ICD-10-CM

## 2016-12-15 DIAGNOSIS — W19XXXA Unspecified fall, initial encounter: Secondary | ICD-10-CM | POA: Diagnosis not present

## 2016-12-15 DIAGNOSIS — I10 Essential (primary) hypertension: Secondary | ICD-10-CM | POA: Diagnosis not present

## 2016-12-15 DIAGNOSIS — M25512 Pain in left shoulder: Secondary | ICD-10-CM

## 2016-12-15 LAB — HEMOGLOBIN A1C: Hgb A1c MFr Bld: 7.3 % — ABNORMAL HIGH (ref 4.6–6.5)

## 2016-12-15 MED ORDER — MECLIZINE HCL 25 MG PO TABS: ORAL_TABLET | ORAL | 1 refills | Status: DC

## 2016-12-15 MED ORDER — DIAZEPAM 2 MG PO TABS: 2.0000 mg | ORAL_TABLET | Freq: Every evening | ORAL | 0 refills | Status: DC | PRN

## 2016-12-15 NOTE — Assessment & Plan Note (Signed)
Concerning for fracture--will send for x-ray Ortho if abnormal

## 2016-12-15 NOTE — Telephone Encounter (Signed)
Patient's car broke down on her way to [facility] for her x-ray.  Patient made it home.  I let her know she can come to our office tomorrow for the x-ray.

## 2016-12-15 NOTE — Addendum Note (Signed)
Addended by: Verlene Mayer A on: 12/15/2016 03:50 PM   Modules accepted: Orders

## 2016-12-15 NOTE — Assessment & Plan Note (Signed)
BP Readings from Last 3 Encounters:  12/15/16 134/76  06/07/16 (!) 145/106  05/06/16 140/68   Good control

## 2016-12-15 NOTE — Assessment & Plan Note (Signed)
Hopefully still good control cymbalta for pain Needs walker

## 2016-12-15 NOTE — Progress Notes (Signed)
Subjective:    Patient ID: Jaclyn Peterson, female    DOB: December 01, 1940, 76 y.o.   MRN: [id number]  HPI Here due to left shoulder pain Has fallen on it a few times and has been here before [place] again on it about 2 weeks ago---can't move it much at all Can't even pick it up to put something in the trash  No pain if stays still, or even in bed (as long as she takes the pain pill) Hurts terribly with any movement No specific Rx for this  Checking sugars daily Usually ~150 in AM No hypoglycemic reactions Still some pain in feet--cymbalta helps  No chest pain  No SOB No dizziness or syncope Her falls are associated with loss of balance--not conciousness  Current Outpatient Prescriptions on File Prior to Visit  Medication Sig Dispense Refill  . amLODipine (NORVASC) 10 MG tablet TAKE 1 TABLET(10 MG) BY MOUTH DAILY 90 tablet 3  . aspirin 81 MG tablet Take 81 mg by mouth daily.      . Blood Glucose Monitoring Suppl (ACCU-CHEK AVIVA PLUS) w/Device KIT Test sugar once daily as directed. Dx code: E11.40 1 kit 0  . diazepam (VALIUM) 2 MG tablet TAKE 1 TABLET BY MOUTH TWICE DAILY AS NEEDED 60 tablet 0  . DULoxetine (CYMBALTA) 60 MG capsule TAKE 1 CAPSULE BY MOUTH TWICE DAILY 180 capsule 0  . FLUoxetine (PROZAC) 40 MG capsule TAKE 1 CAPSULE BY MOUTH EVERY DAY 90 capsule 2  . glipiZIDE (GLUCOTROL) 5 MG tablet TAKE 1 TABLET BY MOUTH TWICE DAILY BEFORE A MEAL 180 tablet 3  . glucose blood test strip Use as instructed.  Dx code:  E11.40. 100 each 12  . JANUVIA 100 MG tablet TAKE 1 TABLET(100 MG) BY MOUTH DAILY 30 tablet 1  . Lancets (ACCU-CHEK SOFT TOUCH) lancets Use as instructed.  Dx code:  E11.40 100 each 12  . losartan (COZAAR) 25 MG tablet TAKE 1 TABLET(25 MG) BY MOUTH DAILY 90 tablet 3  . meclizine (ANTIVERT) 25 MG tablet TAKE 1 TABLET(25 MG) BY MOUTH THREE TIMES DAILY AS NEEDED FOR VERTIGO 90 tablet 1  . metFORMIN (GLUCOPHAGE-XR) 500 MG 24 hr tablet TAKE 4 TABLETS BY MOUTH EVERY DAY  WITH BREAKFAST 120 tablet 1  . propranolol (INDERAL) 20 MG tablet TAKE 1 TABLET BY MOUTH TWICE DAILY 180 tablet 1  . propranolol (INDERAL) 20 MG tablet TAKE 1 TABLET BY MOUTH TWICE DAILY 180 tablet 3  . simvastatin (ZOCOR) 20 MG tablet TAKE 1 TABLET(20 MG) BY MOUTH DAILY 90 tablet 2  . tiZANidine (ZANAFLEX) 2 MG tablet TAKE 1 TABLET BY MOUTH TWICE DAILY AS NEEDED FOR MUSCLE SPASMS 180 tablet 0  . traMADol (ULTRAM) 50 MG tablet TAKE 1 TABLET BY MOUTH THREE TIMES DAILY AS NEEDED FOR PAIN 90 tablet 0   No current facility-administered medications on file prior to visit.     Allergies  Allergen Reactions  . Aspirin     Able to take baby aspirin  . Bisoprolol Fumarate     REACTION: ha  . Chlorthalidone   . Lisinopril     REACTION: throat symptoms  . Sulfa Antibiotics     Told by mother as a child  . Tetracyclines & Related Swelling    Throat swelling    Past Medical History:  Diagnosis Date  . Celiac disease   . Chronic pain   . Depression   . Diabetes mellitus   . Falls frequently    Falls/pneumonia 1/12  .  Hyperlipidemia   . Hypertension   . Migraines   . Neuropathy   . Osteoporosis   . Pericarditis   . Pneumonia   . SBO (small bowel obstruction) ([facility])    obstipation  10/2005  . 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 Vertigo     Past Surgical History:  Procedure Laterality Date  . ABDOMINAL HYSTERECTOMY     BSO 1985  cancer  . APPENDECTOMY     1946  Adhesions shortly after  . BACK SURGERY  1970's  . BLADDER REPAIR     bladder tacking- chronic pain since  1998  . CATARACT EXTRACTION  12/12   left and then laser---Dr Tobe Sos  . CATARACT EXTRACTION W/PHACO Right 06/07/2016   Procedure: CATARACT EXTRACTION PHACO AND INTRAOCULAR LENS PLACEMENT (IOC);  Surgeon: Birder Robson, MD;  Location: [facility] ORS;  Service: Ophthalmology;  Laterality: Right;  [place] 00:43 AP% 20.6 CDE 8.96 Fluid pack lot # [id number]  . CHOLECYSTECTOMY     lysis of adhesions--post op complications,  trach, etc  8/10  . KIDNEY STONE SURGERY     cysto-- [place] 2003    Family History  Problem Relation Age of Onset  . Coronary artery disease Father   . Hypertension Neg Hx   . Diabetes Neg Hx     Social History   Social History  . Marital status: Married    Spouse name: N/A  . Number of children: 2  . Years of education: N/A   Occupational History  . 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 Smoking status: Current Some Day Smoker  . Smokeless tobacco: Never Used     Comment: only once in a while. Gave info on 1-800-QUIT NOW  . Alcohol use No  . Drug use: No  . Sexual activity: Not on file   Other Topics Concern  . Not on file   Social History Narrative   Enjoys crafts      No living will   Daughter, SIL, kids living with her know   Son [name] and daughter [name] should make health care decisions if POA needed   Would accept resuscitation attempts but no prolonged artificial ventilation   No feeding tubes if cognitively unaware            Review of Systems Also fell and hit coccyx Doesn't use walker in house-counseled    Objective:   Physical Exam  Constitutional: She appears well-nourished. No distress.  Neck: No thyromegaly present.  Cardiovascular: Normal rate, regular rhythm, normal heart sounds and intact distal pulses.  Exam reveals no gallop.   No murmur heard. freq extra beats  Pulmonary/Chest: Effort normal and breath sounds normal. No respiratory distress. She has no wheezes. She has no rales.  Musculoskeletal:  Crepitus and marked pain with ROM  Very limited movement in any direction  Lymphadenopathy:    She has no cervical adenopathy.  Skin: No rash noted. No erythema.  No foot lesions  Psychiatric: She has a normal mood and affect. Her behavior is normal.          Assessment & Plan:

## 2016-12-15 NOTE — Assessment & Plan Note (Signed)
Will decrease diazepam to bedtime only #30 per month Must use walker even in house

## 2016-12-15 NOTE — Telephone Encounter (Signed)
Jaclyn Peterson That is fine--not the car though.Marland KitchenMarland KitchenMarland Kitchen

## 2016-12-16 ENCOUNTER — Ambulatory Visit (INDEPENDENT_AMBULATORY_CARE_PROVIDER_SITE_OTHER)
Admission: RE | Admit: 2016-12-16 | Discharge: 2016-12-16 | Disposition: A | Discharge status: Home / Self Care | Payer: Medicare Other | Source: Ambulatory Visit | Attending: Internal Medicine | Admitting: Internal Medicine

## 2016-12-16 DIAGNOSIS — M25512 Pain in left shoulder: Secondary | ICD-10-CM | POA: Diagnosis not present

## 2016-12-26 ENCOUNTER — Ambulatory Visit: Payer: Medicare Other | Admitting: Internal Medicine

## 2016-12-29 ENCOUNTER — Other Ambulatory Visit: Payer: Self-pay | Admitting: Internal Medicine

## 2017-01-05 ENCOUNTER — Other Ambulatory Visit: Payer: Self-pay | Admitting: Internal Medicine

## 2017-01-05 NOTE — Telephone Encounter (Signed)
Please review refill request

## 2017-01-05 NOTE — Telephone Encounter (Signed)
Both last filled 12-08-16 Last OV 12-15-16 No Future OV

## 2017-01-05 NOTE — Telephone Encounter (Signed)
Copied from [place] #[id number]. Topic: Inquiry >> Jan 05, 2017  3:44 PM Oliver Pila B wrote: Reason for CRM: [facility] and put in [facility] medication request for TODAY for Diazepam and Tramadol b/c she cant pick it up tomorrow please help pt and contact her if necessary

## 2017-01-06 NOTE — Telephone Encounter (Signed)
Approved: okay to fill the same quantities x 0 for each Remind her that she cannot request meds and expect refill the same day!

## 2017-01-06 NOTE — Telephone Encounter (Signed)
Left refill on voice mail at pharmacy  Spoke to pt. She said she understands.

## 2017-01-24 ENCOUNTER — Other Ambulatory Visit: Payer: Self-pay | Admitting: Internal Medicine

## 2017-02-06 ENCOUNTER — Other Ambulatory Visit: Payer: Self-pay | Admitting: Internal Medicine

## 2017-02-06 NOTE — Telephone Encounter (Signed)
Both last filled 01-06-17  Tramadol #90 Diazepam #60  Last OV 12-15-16 No Future OV

## 2017-02-06 NOTE — Telephone Encounter (Signed)
Left refill on voice mail at pharmacy  

## 2017-02-06 NOTE — Telephone Encounter (Signed)
Approved: okay same amounts of each with no refills

## 2017-02-07 ENCOUNTER — Telehealth: Payer: Self-pay

## 2017-02-07 NOTE — Telephone Encounter (Signed)
Medications need provider approval.  Copied from [place]. Topic: Quick Communication - See Telephone Encounter >> Feb 06, 2017  3:13 PM Bea Graff, NT wrote: CRM for notification. See Telephone encounter for: Patient calling requesting a refill of tramadol and valium. [facility] uses [facility] on [address].  02/06/17.

## 2017-02-07 NOTE — Telephone Encounter (Signed)
Returning call.

## 2017-02-07 NOTE — Telephone Encounter (Signed)
Left message on voicemail for patient to call back. Refills were called to pharmacy yesterday afternoon.

## 2017-02-07 NOTE — Telephone Encounter (Signed)
Spoke to patient by telephone and was advised that she picked the scripts up last night.

## 2017-02-09 ENCOUNTER — Other Ambulatory Visit: Payer: Self-pay | Admitting: Internal Medicine

## 2017-02-24 ENCOUNTER — Other Ambulatory Visit: Payer: Self-pay | Admitting: Internal Medicine

## 2017-02-24 NOTE — Telephone Encounter (Signed)
Copied from [place]. Topic: Quick Communication - See Telephone Encounter >> Feb 24, 2017  1:01 PM Synthia Innocent wrote: CRM for notification. See Telephone encounter for:  Requesting refill on traMADol (ULTRAM) 50 MG tablet nd  diazepam (VALIUM) 2 MG tablet. Not due till 12/25, wanted to make sure it was filled before then. [address] 02/24/17.

## 2017-02-24 NOTE — Telephone Encounter (Signed)
Spoke to pt and advised request has been made too soon and she will need to make request again closer to 12/25 due date

## 2017-02-24 NOTE — Telephone Encounter (Signed)
Pt was advised when spoken to she may either make request on 12/21 or 12/24

## 2017-02-24 NOTE — Telephone Encounter (Signed)
Pt called back to say she was never requesting a refill, she was just wanting to know how soon she could get it filled close to 12/25 since that is a hoilday & it runs out on 12/25. I advised her to call back 12/21.

## 2017-02-24 NOTE — Telephone Encounter (Signed)
Pt is requesting refill on Tramadol and diazepam.

## 2017-03-03 ENCOUNTER — Other Ambulatory Visit: Payer: Self-pay | Admitting: Internal Medicine

## 2017-03-03 MED ORDER — TRAMADOL HCL 50 MG PO TABS: 50.0000 mg | ORAL_TABLET | Freq: Three times a day (TID) | ORAL | 0 refills | Status: DC | PRN

## 2017-03-03 MED ORDER — DIAZEPAM 2 MG PO TABS: 2.0000 mg | ORAL_TABLET | Freq: Two times a day (BID) | ORAL | 0 refills | Status: DC | PRN

## 2017-03-03 NOTE — Addendum Note (Signed)
Addended by: Clarene Reamer B on: 03/03/2017 05:20 PM   Modules accepted: Orders

## 2017-03-03 NOTE — Telephone Encounter (Signed)
Pt requesting refill tramadol (last refilled # 90 on 02/06/17) and diazepam (last refilled # 60 on 02/06/17). Pt last seen 12/15/16. [facility] [address] Dr Silvio Pate out of office with no computer access.

## 2017-03-03 NOTE — Telephone Encounter (Signed)
Pt calling today to see if the tramadol and diazepam can be refilled. [place]

## 2017-03-03 NOTE — Addendum Note (Signed)
Addended by: Helene Shoe on: 03/03/2017 03:30 PM   Modules accepted: Orders

## 2017-03-03 NOTE — Telephone Encounter (Signed)
Please call in tramadol and diazepam to patient's pharmacy.

## 2017-03-06 ENCOUNTER — Telehealth: Payer: Self-pay | Admitting: Internal Medicine

## 2017-03-06 MED ORDER — TRAMADOL HCL 50 MG PO TABS: 50.0000 mg | ORAL_TABLET | Freq: Three times a day (TID) | ORAL | 0 refills | Status: DC | PRN

## 2017-03-06 MED ORDER — DIAZEPAM 2 MG PO TABS: 2.0000 mg | ORAL_TABLET | Freq: Two times a day (BID) | ORAL | 0 refills | Status: DC | PRN

## 2017-03-06 NOTE — Telephone Encounter (Signed)
Refills still pending from 03/03/17.

## 2017-03-06 NOTE — Telephone Encounter (Signed)
Dr Silvio Pate is with pts. We will take care of the rxs as soon as he is done with clinic.

## 2017-03-06 NOTE — Telephone Encounter (Signed)
Copied from [place] [id number]. Topic: Inquiry >> Mar 06, 2017  8:49 AM Jaclyn Peterson wrote: Reason for CRM: Patient called to get an update on her prescriptions refill request. Patient has requested Diazepam (VALIUM) 2 MG tablet, tiZANidine (ZANAFLEX) 2 MG tablet, and traMADol (ULTRAM) 50 MG tablet. Patient states that she must have the medications this morning before the office closes as she has been waiting for a week for these refills. Patient's preferred pharmacy is [id number] - [place], [place] - [place] [phone] (Phone) [phone] (Fax).

## 2017-03-06 NOTE — Telephone Encounter (Signed)
Left refills on voice mail at pharmacy. Spoke to pt.

## 2017-03-06 NOTE — Telephone Encounter (Signed)
Pt called back wanting more information about her medication refills,  She would like call back at [phone]

## 2017-03-06 NOTE — Telephone Encounter (Signed)
Approved:okay to refill all 3 with same quantities and no refills

## 2017-03-06 NOTE — Telephone Encounter (Signed)
Copied from [place] [phone]. Topic: Inquiry >> Mar 06, 2017  8:49 AM Pricilla Handler wrote: Reason for CRM: Patient called to get an update on her prescriptions refill request. Patient has requested Diazepam (VALIUM) 2 MG tablet, tiZANidine (ZANAFLEX) 2 MG tablet, and traMADol (ULTRAM) 50 MG tablet. Patient states that she must have the medications this morning before the office closes as she has been waiting for a week for these refills. Patient's preferred pharmacy is [id number] - [place] [address], [place] - [place] [phone] (Phone) [phone] (Fax).

## 2017-03-06 NOTE — Telephone Encounter (Signed)
Pt calling about med refill again

## 2017-03-06 NOTE — Telephone Encounter (Signed)
Tramadol last filled 02-06-17 #90 Diazepam last filled 02-06-17 #60  Last OV 12-15-16. No future OV

## 2017-03-06 NOTE — Telephone Encounter (Signed)
See previous request. He is in clinic.

## 2017-03-08 ENCOUNTER — Other Ambulatory Visit: Payer: Self-pay | Admitting: Internal Medicine

## 2017-03-14 ENCOUNTER — Other Ambulatory Visit: Payer: Self-pay | Admitting: Internal Medicine

## 2017-03-15 ENCOUNTER — Ambulatory Visit: Payer: Self-pay

## 2017-03-15 MED ORDER — PROPRANOLOL HCL 20 MG PO TABS: 20.0000 mg | ORAL_TABLET | Freq: Two times a day (BID) | ORAL | 1 refills | Status: DC

## 2017-03-15 NOTE — Telephone Encounter (Signed)
Phone call from pt.  Reported her "feet, ankles, and lower legs almost to knees, are swollen."  Reported this is new for her. Stated it happened over the holidays between Christmas and New Years.  Denied shortness of breath, chest pain, or cough.  Reported she has been eating a lot of pretzels over the holidays.  Denied any redness or skin breakdown of lower extremities.  Care advice per protocol.  Advised to elevate LE's above level of heart at least 3 times/ day.  Encouraged to decrease intake of high sodium foods. Appt. Given for 03/17/17.  Encour. To call back if symptoms worsen.  Verb. Understanding and agrees with plan.   Pt. Also requested refill on Propranolol.  Advised will order refill of this.     Reason for Disposition . [1] MODERATE leg swelling (e.g., swelling extends up to knees) AND [2] new onset or worsening  Answer Assessment - Initial Assessment Questions 1. ONSET: "When did the swelling start?" (e.g., minutes, hours, days)     during the holidays between Christmas and New Years.  2. LOCATION: "What part of the leg is swollen?"  "Are both legs swollen or just one leg?"    Bilateral feet and lower legs, almost to the knee 3. SEVERITY: "How bad is the swelling?" (e.g., localized; mild, moderate, severe)  - Localized - small area of swelling localized to one leg  - MILD pedal edema - swelling limited to foot and ankle, pitting edema < 1/4 inch (6 mm) deep, rest and elevation eliminate most or all swelling  - MODERATE edema - swelling of lower leg to knee, pitting edema > 1/4 inch (6 mm) deep, rest and elevation only partially reduce swelling  - SEVERE edema - swelling extends above knee, facial or hand swelling present      Moderately swollen; skin is shiny 4. REDNESS: "Does the swelling look red or infected?"     No 5. PAIN: "Is the swelling painful to touch?" If so, ask: "How painful is it?"   (Scale 1-10; mild, moderate or severe)     Denies pain  6. FEVER: "Do you have a  fever?" If so, ask: "What is it, how was it measured, and when did it start?"      no 7. CAUSE: "What do you think is causing the leg swelling?"     Eating a lot of pretzels   8. MEDICAL HISTORY: "Do you have a history of heart failure, kidney disease, liver failure, or cancer?"     Denies CHF, Kidney disease, liver disease, or cancer; is Diabetic 9. RECURRENT SYMPTOM: "Have you had leg swelling before?" If so, ask: "When was the last time?" "What happened that time?"    No; never had this issue 10. OTHER SYMPTOMS: "Do you have any other symptoms?" (e.g., chest pain, difficulty breathing)       Denies shortness of breath or chest pain; denies cough 11. PREGNANCY: "Is there any chance you are pregnant?" "When was your last menstrual period?"     no  Protocols used: LEG SWELLING AND EDEMA-A-AH

## 2017-03-17 ENCOUNTER — Ambulatory Visit (INDEPENDENT_AMBULATORY_CARE_PROVIDER_SITE_OTHER): Payer: Medicare Other | Admitting: Family Medicine

## 2017-03-17 ENCOUNTER — Encounter: Payer: Self-pay | Admitting: Family Medicine

## 2017-03-17 VITALS — BP 140/70 | HR 67 | Temp 98.2°F | Wt 164.0 lb

## 2017-03-17 DIAGNOSIS — R600 Localized edema: Secondary | ICD-10-CM | POA: Insufficient documentation

## 2017-03-17 MED ORDER — PROPRANOLOL HCL 20 MG PO TABS: 20.0000 mg | ORAL_TABLET | Freq: Two times a day (BID) | ORAL | 0 refills | Status: DC

## 2017-03-17 NOTE — Assessment & Plan Note (Signed)
Anticipate diet related pedal edema - recommended back off pretzel intake and monitor swelling. If not improving with this, suggested 1/2 amlodipine dose for 2 wk trial and f/u with PCP. Pt agrees with plan.

## 2017-03-17 NOTE — Patient Instructions (Addendum)
Leg swelling may be due to medicine (amlodipine) or salty pretzels. Back off pretzels. If this doesn't help, decrease amlodipine to 1/2 tablet daily and follow up with Dr Silvio Pate.  Work on decreased salt in the diet, increase water, and keep legs elevated.  Good to see you today, call us with questions.  Edema Edema is when you have too much fluid in your body or under your skin. Edema may make your legs, feet, and ankles swell up. Swelling is also common in looser tissues, like around your eyes. This is a common condition. It gets more common as you get older. There are many possible causes of edema. Eating too much salt (sodium) and being on your feet or sitting for a long time can cause edema in your legs, feet, and ankles. Hot weather may make edema worse. Edema is usually painless. Your skin may look swollen or shiny. Follow these instructions at home:  Keep the swollen body part raised (elevated) above the level of your heart when you are sitting or lying down.  Do not sit still or stand for a long time.  Do not wear tight clothes. Do not wear garters on your upper legs.  Exercise your legs. This can help the swelling go down.  Wear elastic bandages or support stockings as told by your doctor.  Eat a low-salt (low-sodium) diet to reduce fluid as told by your doctor.  Depending on the cause of your swelling, you may need to limit how much fluid you drink (fluid restriction).  Take over-the-counter and prescription medicines only as told by your doctor. Contact a doctor if:  Treatment is not working.  You have heart, liver, or kidney disease and have symptoms of edema.  You have sudden and unexplained weight gain. Get help right away if:  You have shortness of breath or chest pain.  You cannot breathe when you lie down.  You have pain, redness, or warmth in the swollen areas.  You have heart, liver, or kidney disease and get edema all of a sudden.  You have a fever and  your symptoms get worse all of a sudden. Summary  Edema is when you have too much fluid in your body or under your skin.  Edema may make your legs, feet, and ankles swell up. Swelling is also common in looser tissues, like around your eyes.  Raise (elevate) the swollen body part above the level of your heart when you are sitting or lying down.  Follow your doctor's instructions about diet and how much fluid you can drink (fluid restriction). This information is not intended to replace advice given to you by your health care provider. Make sure you discuss any questions you have with your health care provider. Document Released: 08/17/2007 Document Revised: 03/18/2016 Document Reviewed: 03/18/2016 Elsevier Interactive Patient Education  2017 [employer].

## 2017-03-17 NOTE — Progress Notes (Signed)
BP 140/70 (BP Location: Left Arm, Patient Position: Sitting, Cuff Size: Normal)   Pulse 67   Temp 98.2 F (36.8 C) (Oral)   Wt 164 lb (74.4 kg)   SpO2 99%   BMI 27.29 kg/m    CC: BLE swelling Subjective:    Patient ID: Jaclyn Peterson, female    DOB: August 30, 1940, 77 y.o.   MRN: [id number]  HPI: Jaclyn Peterson is a 77 y.o. female presenting on 03/17/2017 for Leg Swelling (in bilateral lower LEs. Denies any pain.Started about 03/08/17.)   Pt of Dr Alla German presents today with 1 wk h/o BLE swelling over holidays. Notes swelling from feet up to knees. Bilateral symmetrical swelling associated with itching. Mild redness R>L. Denies warmth of legs. No pain. New prominent veins. No chest pain, dyspnea, dizziness or palpitations.   No new medications other than januvia started 01/2017.  No increased salt intake.  She does enjoy eating salty stick pretzels and had increased intake of this over holidays as her family went to beach and left her alone.   1 month ago did have fall when she stepped onto empty water bottle.  She ran out of propranolol - requests refill locally. She lost several of these tablets and her pharmacy won't fill early.   Relevant past medical, surgical, family and social history reviewed and updated as indicated. Interim medical history since our last visit reviewed. Allergies and medications reviewed and updated. Outpatient Medications Prior to Visit  Medication Sig Dispense Refill  . ACCU-CHEK AVIVA PLUS test strip USE AS DIRECTED 100 each 2  . amLODipine (NORVASC) 10 MG tablet TAKE 1 TABLET(10 MG) BY MOUTH DAILY 90 tablet 3  . aspirin 81 MG tablet Take 81 mg by mouth daily.      . Blood Glucose Monitoring Suppl (ACCU-CHEK AVIVA PLUS) w/Device KIT TEST BLOOD SUGAR DAILY AS DIRECTED 1 kit 0  . cholestyramine (QUESTRAN) 4 g packet MIX AND DRINK 1 PACKET BY MOUTH DAILY AS DIRECTED 60 packet 0  . diazepam (VALIUM) 2 MG tablet Take 1 tablet (2 mg total) by  mouth 2 (two) times daily as needed. 60 tablet 0  . DULoxetine (CYMBALTA) 60 MG capsule TAKE 1 CAPSULE BY MOUTH TWICE DAILY 180 capsule 0  . FLUoxetine (PROZAC) 40 MG capsule TAKE 1 CAPSULE BY MOUTH EVERY DAY 90 capsule 3  . glipiZIDE (GLUCOTROL) 5 MG tablet TAKE 1 TABLET BY MOUTH TWICE DAILY BEFORE A MEAL 180 tablet 3  . JANUVIA 100 MG tablet TAKE 1 TABLET(100 MG) BY MOUTH DAILY 30 tablet 11  . Lancets (ACCU-CHEK SOFT TOUCH) lancets Use as instructed.  Dx code:  E11.40 100 each 12  . losartan (COZAAR) 25 MG tablet TAKE 1 TABLET(25 MG) BY MOUTH DAILY 90 tablet 3  . meclizine (ANTIVERT) 25 MG tablet TAKE 1 TABLET(25 MG) BY MOUTH THREE TIMES DAILY AS NEEDED FOR VERTIGO 90 tablet 1  . metFORMIN (GLUCOPHAGE-XR) 500 MG 24 hr tablet TAKE 4 TABLETS BY MOUTH EVERY DAY WITH BREAKFAST 120 tablet 11  . propranolol (INDERAL) 20 MG tablet Take 1 tablet (20 mg total) by mouth 2 (two) times daily. 180 tablet 1  . simvastatin (ZOCOR) 20 MG tablet TAKE 1 TABLET(20 MG) BY MOUTH DAILY 90 tablet 0  . tiZANidine (ZANAFLEX) 2 MG tablet TAKE 1 TABLET BY MOUTH TWICE DAILY AS NEEDED FOR MUSCLE SPASMS 180 tablet 0  . traMADol (ULTRAM) 50 MG tablet Take 1 tablet (50 mg total) by mouth 3 (three) times daily as needed.  for pain 90 tablet 0  . glipiZIDE (GLUCOTROL) 5 MG tablet TAKE 1 TABLET BY MOUTH TWICE DAILY BEFORE A MEAL 180 tablet 3   No facility-administered medications prior to visit.      Per HPI unless specifically indicated in ROS section below Review of Systems     Objective:    BP 140/70 (BP Location: Left Arm, Patient Position: Sitting, Cuff Size: Normal)   Pulse 67   Temp 98.2 F (36.8 C) (Oral)   Wt 164 lb (74.4 kg)   SpO2 99%   BMI 27.29 kg/m   Wt Readings from Last 3 Encounters:  03/17/17 164 lb (74.4 kg)  12/15/16 155 lb (70.3 kg)  06/01/16 166 lb (75.3 kg)    Physical Exam  Constitutional: She appears well-developed and well-nourished. No distress.  HENT:  Mouth/Throat: Oropharynx is  clear and moist. No oropharyngeal exudate.  Eyes: Conjunctivae are normal. Pupils are equal, round, and reactive to light.  Cardiovascular: Normal rate, regular rhythm, normal heart sounds and intact distal pulses.  No murmur heard. Pulmonary/Chest: Effort normal and breath sounds normal. No respiratory distress. She has no wheezes. She has no rales.  Musculoskeletal: She exhibits edema (1+ nonpitting edema from mid foot to knees).  2+ DP bilaterally  Skin: Skin is warm and dry. No rash noted.  Nursing note and vitals reviewed.  Results for orders placed or performed in visit on 12/15/16  Hemoglobin A1c  Result Value Ref Range   Hgb A1c MFr Bld 7.3 (H) 4.6 - 6.5 %   Lab Results  Component Value Date   CREATININE 0.91 05/06/2016       Assessment & Plan:  Propranolol #10 tablets printed for patient to take to local pharmacy to fill.  Problem List Items Addressed This Visit    Pedal edema - Primary    Anticipate diet related pedal edema - recommended back off pretzel intake and monitor swelling. If not improving with this, suggested 1/2 amlodipine dose for 2 wk trial and f/u with PCP. Pt agrees with plan.           Follow up plan: No Follow-up on file.  Ria Bush, MD

## 2017-03-19 ENCOUNTER — Other Ambulatory Visit: Payer: Self-pay | Admitting: Internal Medicine

## 2017-03-22 ENCOUNTER — Other Ambulatory Visit: Payer: Self-pay | Admitting: Internal Medicine

## 2017-03-28 ENCOUNTER — Emergency Department
Admission: EM | Admit: 2017-03-28 | Discharge: 2017-03-29 | Disposition: A | Discharge status: Home / Self Care | Payer: Medicare Other | Source: Home / Self Care

## 2017-03-28 ENCOUNTER — Emergency Department
Admission: EM | Admit: 2017-03-28 | Discharge: 2017-03-28 | Disposition: A | Discharge status: Home / Self Care | Payer: Medicare Other | Attending: Emergency Medicine | Admitting: Emergency Medicine

## 2017-03-28 ENCOUNTER — Encounter: Payer: Self-pay | Admitting: Emergency Medicine

## 2017-03-28 ENCOUNTER — Other Ambulatory Visit: Payer: Self-pay

## 2017-03-28 ENCOUNTER — Emergency Department: Payer: Medicare Other

## 2017-03-28 DIAGNOSIS — F17200 Nicotine dependence, unspecified, uncomplicated: Secondary | ICD-10-CM | POA: Insufficient documentation

## 2017-03-28 DIAGNOSIS — R1084 Generalized abdominal pain: Secondary | ICD-10-CM

## 2017-03-28 DIAGNOSIS — K5900 Constipation, unspecified: Secondary | ICD-10-CM | POA: Insufficient documentation

## 2017-03-28 DIAGNOSIS — I10 Essential (primary) hypertension: Secondary | ICD-10-CM | POA: Diagnosis not present

## 2017-03-28 DIAGNOSIS — E1143 Type 2 diabetes mellitus with diabetic autonomic (poly)neuropathy: Secondary | ICD-10-CM | POA: Insufficient documentation

## 2017-03-28 DIAGNOSIS — Z7984 Long term (current) use of oral hypoglycemic drugs: Secondary | ICD-10-CM | POA: Diagnosis not present

## 2017-03-28 DIAGNOSIS — Z7982 Long term (current) use of aspirin: Secondary | ICD-10-CM

## 2017-03-28 DIAGNOSIS — Z79899 Other long term (current) drug therapy: Secondary | ICD-10-CM | POA: Insufficient documentation

## 2017-03-28 DIAGNOSIS — Z882 Allergy status to sulfonamides status: Secondary | ICD-10-CM | POA: Diagnosis not present

## 2017-03-28 DIAGNOSIS — R110 Nausea: Secondary | ICD-10-CM | POA: Insufficient documentation

## 2017-03-28 DIAGNOSIS — K567 Ileus, unspecified: Secondary | ICD-10-CM | POA: Insufficient documentation

## 2017-03-28 DIAGNOSIS — Z8541 Personal history of malignant neoplasm of cervix uteri: Secondary | ICD-10-CM | POA: Diagnosis not present

## 2017-03-28 LAB — URINALYSIS, COMPLETE (UACMP) WITH MICROSCOPIC
Bilirubin Urine: NEGATIVE
Glucose, UA: NEGATIVE mg/dL
KETONES UR: NEGATIVE mg/dL
Leukocytes, UA: NEGATIVE
Nitrite: NEGATIVE
PROTEIN: 100 mg/dL — AB
Specific Gravity, Urine: 1.016 (ref 1.005–1.030)
pH: 5 (ref 5.0–8.0)

## 2017-03-28 LAB — COMPREHENSIVE METABOLIC PANEL
ALBUMIN: 3.8 g/dL (ref 3.5–5.0)
ALT: 14 U/L (ref 14–54)
AST: 22 U/L (ref 15–41)
Alkaline Phosphatase: 85 U/L (ref 38–126)
Anion gap: 11 (ref 5–15)
BILIRUBIN TOTAL: 0.5 mg/dL (ref 0.3–1.2)
BUN: 26 mg/dL — AB (ref 6–20)
CO2: 19 mmol/L — AB (ref 22–32)
Calcium: 9.2 mg/dL (ref 8.9–10.3)
Chloride: 107 mmol/L (ref 101–111)
Creatinine, Ser: 1.16 mg/dL — ABNORMAL HIGH (ref 0.44–1.00)
GFR calc Af Amer: 51 mL/min — ABNORMAL LOW (ref >60)
GFR calc non Af Amer: 44 mL/min — ABNORMAL LOW (ref >60)
GLUCOSE: 159 mg/dL — AB (ref 65–99)
POTASSIUM: 3.8 mmol/L (ref 3.5–5.1)
SODIUM: 137 mmol/L (ref 135–145)
TOTAL PROTEIN: 7.7 g/dL (ref 6.5–8.1)

## 2017-03-28 LAB — CBC
HEMATOCRIT: 39.4 % (ref 35.0–47.0)
Hemoglobin: 13.3 g/dL (ref 12.0–16.0)
MCH: 30.8 pg (ref 26.0–34.0)
MCHC: 33.6 g/dL (ref 32.0–36.0)
MCV: 91.6 fL (ref 80.0–100.0)
Platelets: 369 10*3/uL (ref 150–440)
RBC: 4.31 MIL/uL (ref 3.80–5.20)
RDW: 13 % (ref 11.5–14.5)
WBC: 13.3 10*3/uL — ABNORMAL HIGH (ref 3.6–11.0)

## 2017-03-28 LAB — LIPASE, BLOOD: Lipase: 35 U/L (ref 11–51)

## 2017-03-28 LAB — TROPONIN I: Troponin I: <0.03 ng/mL (ref <0.03)

## 2017-03-28 MED ORDER — IOPAMIDOL (ISOVUE-300) INJECTION 61%
30.0000 mL | Freq: Once | INTRAVENOUS | Status: AC
  Administered 2017-03-28: 30 mL via ORAL

## 2017-03-28 MED ORDER — POLYETHYLENE GLYCOL 3350 17 G PO PACK
PACK | ORAL | Status: AC
  Filled 2017-03-28: qty 1

## 2017-03-28 MED ORDER — TRAMADOL HCL 50 MG PO TABS
50.0000 mg | ORAL_TABLET | Freq: Once | ORAL | Status: AC
  Administered 2017-03-28: 50 mg via ORAL
  Filled 2017-03-28: qty 1

## 2017-03-28 MED ORDER — IOPAMIDOL (ISOVUE-300) INJECTION 61%
75.0000 mL | Freq: Once | INTRAVENOUS | Status: AC | PRN
  Administered 2017-03-28: 75 mL via INTRAVENOUS

## 2017-03-28 MED ORDER — SORBITOL 70 % SOLN
960.0000 mL | TOPICAL_OIL | Freq: Once | ORAL | Status: AC
  Administered 2017-03-28: 960 mL via RECTAL
  Filled 2017-03-28: qty 473

## 2017-03-28 MED ORDER — SENNOSIDES-DOCUSATE SODIUM 8.6-50 MG PO TABS
2.0000 | ORAL_TABLET | Freq: Once | ORAL | Status: AC
Start: 2017-03-28 — End: 2017-03-28
  Administered 2017-03-28: 2 via ORAL
  Filled 2017-03-28: qty 2

## 2017-03-28 MED ORDER — ONDANSETRON HCL 4 MG/2ML IJ SOLN
4.0000 mg | Freq: Once | INTRAMUSCULAR | Status: AC
  Administered 2017-03-28: 4 mg via INTRAVENOUS
  Filled 2017-03-28: qty 2

## 2017-03-28 MED ORDER — KETOROLAC TROMETHAMINE 30 MG/ML IJ SOLN
15.0000 mg | Freq: Once | INTRAMUSCULAR | Status: AC
  Administered 2017-03-28: 15 mg via INTRAVENOUS

## 2017-03-28 MED ORDER — PROPRANOLOL HCL 20 MG PO TABS
20.0000 mg | ORAL_TABLET | Freq: Once | ORAL | Status: AC
  Administered 2017-03-28: 20 mg via ORAL
  Filled 2017-03-28: qty 1

## 2017-03-28 MED ORDER — POLYETHYLENE GLYCOL 3350 17 G PO PACK
17.0000 g | PACK | Freq: Every day | ORAL | Status: DC
  Administered 2017-03-28: 17 g via ORAL

## 2017-03-28 MED ORDER — SODIUM CHLORIDE 0.9 % IV BOLUS (SEPSIS)
1000.0000 mL | Freq: Once | INTRAVENOUS | Status: AC
  Administered 2017-03-28: 1000 mL via INTRAVENOUS

## 2017-03-28 MED ORDER — FLEET ENEMA 7-19 GM/118ML RE ENEM
1.0000 | ENEMA | Freq: Once | RECTAL | Status: AC
  Administered 2017-03-28: 1 via RECTAL

## 2017-03-28 MED ORDER — KETOROLAC TROMETHAMINE 30 MG/ML IJ SOLN
INTRAMUSCULAR | Status: AC
  Administered 2017-03-28: 15 mg via INTRAVENOUS
  Filled 2017-03-28: qty 1

## 2017-03-28 MED ORDER — ONDANSETRON 4 MG PO TBDP
4.0000 mg | ORAL_TABLET | Freq: Once | ORAL | Status: AC
  Administered 2017-03-28: 4 mg via ORAL
  Filled 2017-03-28: qty 1

## 2017-03-28 MED ORDER — AMLODIPINE BESYLATE 5 MG PO TABS
10.0000 mg | ORAL_TABLET | Freq: Once | ORAL | Status: AC
  Administered 2017-03-28: 10 mg via ORAL
  Filled 2017-03-28: qty 2

## 2017-03-28 MED ORDER — MORPHINE SULFATE (PF) 4 MG/ML IV SOLN
4.0000 mg | Freq: Once | INTRAVENOUS | Status: AC
  Administered 2017-03-28: 4 mg via INTRAVENOUS
  Filled 2017-03-28: qty 1

## 2017-03-28 NOTE — ED Notes (Signed)
Patient transported to CT 

## 2017-03-28 NOTE — ED Triage Notes (Addendum)
Pt to triage via w/c, appears uncomfortable; pt st generalized abd pain and nausea with hx bowel obstruction and "feels same"; st last BM 2 days ago; pt taken to room 6 and placed on card monitor

## 2017-03-28 NOTE — ED Notes (Signed)
Pt changed, given peri care, lying back on bed in NAD at this time.

## 2017-03-28 NOTE — ED Notes (Signed)
Pt verbally verified her prescribed BP medications: Propranolol 68m and Amlodipine 126mthat she takes daily. Pt states she did not take her BP medications yest. EDP made aware, see MAR for follow up.

## 2017-03-28 NOTE — ED Provider Notes (Signed)
Signout from Dr. Owens Shark in this 77 year old female presenting to the emergency department for abdominal pain.  Plan is to discharge home.  Just pending urinalysis at this time.  Physical Exam  BP (!) 183/68   Pulse 63   Temp 97.9 F (36.6 C) (Oral)   Resp 17   Ht 5' 5"  (1.651 m)   Wt 72.6 kg (160 lb)   SpO2 95%   BMI 26.63 kg/m  ----------------------------------------- 7:46 AM on 03/28/2017 -----------------------------------------   Physical Exam Abdomen is mildly tender throughout but soft and without any rebound or guarding ED Course/Procedures     Procedures  MDM  Urinalysis with many bacteria but also with positive squamous cells.  Patient denies any burning with urination or urinary frequency.  I will not treat her at this time.  We will send the urine for culture.  I discussed this plan with the family as well as the patient and they are understanding and willing to comply.  Patient to be discharged at this time.       Orbie Pyo, MD 03/28/17 [phone]

## 2017-03-28 NOTE — ED Provider Notes (Signed)
[facility] Emergency Department Provider Note ____________________________________________  Time seen: 2000  I have reviewed the triage vital signs and the nursing notes.  HISTORY  Chief Complaint  Constipation  HPI Jaclyn Peterson is a 77 y.o. female returns to the ED, accompanied by her adult daughter, for evaluation and management of continued constipation and abdominal pain.  Patient was seen in the ED yesterday for abdominal pain.  She was found on CT scan to have a moderate stool burden without signs of obstruction.  She was apparently discharged with Senokot to dose at home.  She returns today stating no benefit from the Senokot.  She denies any nausea or vomiting.  She notes continued abdominal pressure and denies any flatulence or stool passage prior to arrival.  Past Medical History:  Diagnosis Date  . Celiac disease   . Chronic pain   . Depression   . Diabetes mellitus   . Falls frequently    Falls/pneumonia 1/12  . Hyperlipidemia   . Hypertension   . Migraines   . Neuropathy   . Osteoporosis   . Pericarditis   . Pneumonia   . SBO (small bowel obstruction) (H[facility])    obstipation  10/2005  . Uterine cancer ([place])    P32 INSERT  . Vertigo     Patient Active Problem List   Diagnosis Date Noted  . Pedal edema 03/17/2017  . Left shoulder pain 12/15/2016  . Fall with injury 12/15/2016  . Benign paroxysmal positional vertigo 12/22/2015  . Advanced directives, counseling/discussion 11/01/2013  . Routine general medical examination at a health care facility 08/11/2011  . Type 2 diabetes mellitus with polyneuropathy ([place]) 09/02/2006  . HYPERLIPIDEMIA 08/29/2006  . Episodic mood disorder ([place]) 08/29/2006  . PAIN, CHRONIC NEC 08/29/2006  . Essential hypertension, benign 08/29/2006  . OSTEOPOROSIS 08/29/2006  . UTERINE CANCER, HX OF 08/29/2006    Past Surgical History:  Procedure Laterality Date  . ABDOMINAL HYSTERECTOMY     BSO 1985   cancer  . APPENDECTOMY     1946  Adhesions shortly after  . BACK SURGERY  1970's  . BLADDER REPAIR     bladder tacking- chronic pain since  1998  . CATARACT EXTRACTION  12/12   left and then laser---Dr Tobe Sos  . CATARACT EXTRACTION W/PHACO Right 06/07/2016   Procedure: CATARACT EXTRACTION PHACO AND INTRAOCULAR LENS PLACEMENT (IOC);  Surgeon: Birder Robson, MD;  Location: [facility] ORS;  Service: Ophthalmology;  Laterality: Right;  [place] 00:43 AP% 20.6 CDE 8.96 Fluid pack lot # [id number]  . CHOLECYSTECTOMY     lysis of adhesions--post op complications, trach, etc  8/10  . KIDNEY STONE SURGERY     cysto-- [place] 2003    Prior to Admission medications   Medication Sig Start Date End Date Taking? Authorizing Provider  ACCU-CHEK AVIVA PLUS test strip USE AS DIRECTED 03/08/17   Viviana Simpler I, MD  amLODipine (NORVASC) 10 MG tablet TAKE 1 TABLET(10 MG) BY MOUTH DAILY 04/11/16   Viviana Simpler I, MD  aspirin 81 MG tablet Take 81 mg by mouth daily.      [provider]  Blood Glucose Monitoring Suppl (ACCU-CHEK AVIVA PLUS) w/Device KIT TEST BLOOD SUGAR DAILY AS DIRECTED 03/08/17   Venia Carbon, MD  cholestyramine (QUESTRAN) 4 g packet MIX AND DRINK 1 PACKET BY MOUTH DAILY AS DIRECTED 03/20/17   Venia Carbon, MD  diazepam (VALIUM) 2 MG tablet Take 1 tablet (2 mg total) by mouth 2 (two) times daily  as needed. 03/06/17   Venia Carbon, MD  DULoxetine (CYMBALTA) 60 MG capsule TAKE 1 CAPSULE BY MOUTH TWICE DAILY 02/09/17   Viviana Simpler I, MD  FLUoxetine (PROZAC) 40 MG capsule TAKE 1 CAPSULE BY MOUTH EVERY DAY 12/29/16   Viviana Simpler I, MD  glipiZIDE (GLUCOTROL) 5 MG tablet TAKE 1 TABLET BY MOUTH TWICE DAILY BEFORE A MEAL 01/24/17   Venia Carbon, MD  JANUVIA 100 MG tablet TAKE 1 TABLET(100 MG) BY MOUTH DAILY 01/24/17   Venia Carbon, MD  Lancets (ACCU-CHEK SOFT TOUCH) lancets Use as instructed.  Dx code:  E11.40 02/17/16   Viviana Simpler I, MD  losartan (COZAAR) 25  MG tablet TAKE 1 TABLET(25 MG) BY MOUTH DAILY 04/18/16   Venia Carbon, MD  meclizine (ANTIVERT) 25 MG tablet TAKE 1 TABLET(25 MG) BY MOUTH THREE TIMES DAILY AS NEEDED FOR VERTIGO 03/23/17   Viviana Simpler I, MD  metFORMIN (GLUCOPHAGE-XR) 500 MG 24 hr tablet TAKE 4 TABLETS BY MOUTH EVERY DAY WITH BREAKFAST 12/29/16   Venia Carbon, MD  propranolol (INDERAL) 20 MG tablet Take 1 tablet (20 mg total) by mouth 2 (two) times daily. 03/15/17   Venia Carbon, MD  simvastatin (ZOCOR) 20 MG tablet TAKE 1 TABLET(20 MG) BY MOUTH DAILY 02/24/17   Viviana Simpler I, MD  tiZANidine (ZANAFLEX) 2 MG tablet TAKE 1 TABLET BY MOUTH TWICE DAILY AS NEEDED FOR MUSCLE SPASMS 03/06/17   Venia Carbon, MD  traMADol (ULTRAM) 50 MG tablet Take 1 tablet (50 mg total) by mouth 3 (three) times daily as needed. for pain 03/06/17   Viviana Simpler I, MD    Allergies Aspirin; Bisoprolol fumarate; Chlorthalidone; Lisinopril; Sulfa antibiotics; and Tetracyclines & related  Family History  Problem Relation Age of Onset  . Coronary artery disease Father   . Hypertension Neg Hx   . Diabetes Neg Hx     Social History Social History   Tobacco Use  . Smoking status: Current Some Day Smoker  . Smokeless tobacco: Never Used  . Tobacco comment: only once in a while. Gave info on 1-800-QUIT NOW  Substance Use Topics  . Alcohol use: No    Alcohol/week: 0.0 oz  . Drug use: No    Review of Systems  Constitutional: Negative for fever. Cardiovascular: Negative for chest pain. Respiratory: Negative for shortness of breath. Gastrointestinal: Negative for abdominal pain, vomiting and diarrhea. Abdominal pain due to constipation Genitourinary: Negative for dysuria. Musculoskeletal: Negative for back pain. Skin: Negative for rash. Neurological: Negative for headaches, focal weakness or numbness. ____________________________________________  PHYSICAL EXAM:  VITAL SIGNS: ED Triage Vitals  Enc Vitals Group     BP  03/28/17 2023 (!) 193/60     Pulse Rate 03/28/17 2023 65     Resp 03/28/17 2023 16     Temp 03/28/17 2023 98.2 F (36.8 C)     Temp Source 03/28/17 2023 Oral     SpO2 03/28/17 2023 100 %     Weight 03/28/17 2024 160 lb (72.6 kg)     Height 03/28/17 2024 5' 5.5" (1.664 m)     Head Circumference --      Peak Flow --      Pain Score 03/28/17 2023 10     Pain Loc --      Pain Edu? --      Excl. in [place]? --     Constitutional: Alert and oriented. Well appearing and in no distress. Head: Normocephalic and atraumatic. Cardiovascular:  Normal rate, regular rhythm. Normal distal pulses. Respiratory: Normal respiratory effort. No wheezes/rales/rhonchi. Gastrointestinal: Soft and nontender. No distention, no rebound, guarding, or rigidity.  Normal bowel sounds appreciated. Musculoskeletal: Nontender with normal range of motion in all extremities.  Neurologic:  Normal gait without ataxia. Normal speech and language. No gross focal neurologic deficits are appreciated. Skin:  Skin is warm, dry and intact. No rash noted. Psychiatric: Mood and affect are normal. Patient exhibits appropriate insight and judgment. ____________________________________________   RADIOLOGY  CT ABD (03/28/17)  IMPRESSION: 1. Moderate colonic stool burden. No bowel obstruction or active inflammation. 2. Dilatation of the intrahepatic biliary tree and CBD with interval progression compared to the prior CT. A 7 mm density in the central CBD appears to be metallic and likely surgical clips and less likely a stone. No associated obstruction. Decrease in the dilatation of the CBD compared to the prior CT may represent natural progression post cholecystectomy. MRCP may provide better evaluation if clinically indicated. 3.  Aortic Atherosclerosis (ICD10-I70.0). ____________________________________________  PROCEDURES  Procedures Ultram 50 mg PO Miralax 17 g PO SMOG  enema ____________________________________________  INITIAL IMPRESSION / ASSESSMENT AND PLAN / ED COURSE  Geriatric patient with ED evaluation for continued constipation.  Patient's exam is overall benign.  She has had passage of moderate amount of watery brown stool following 1/2-1/3 of her enema in the ED.    ----------------------------------------- 11:45 AM on 03/28/2017 ----------------------------------------- Spoke to Dr. Beather Arbour, will transfer care of this patient to her at the time of this disposition.  Patient is in the ED with continued complaints of abdominal pain and constipation.   FINAL CLINICAL IMPRESSION(S) / ED DIAGNOSES  Final diagnoses:  Constipation, unspecified constipation type      Melvenia Needles, PA-C 03/29/17 0106

## 2017-03-28 NOTE — ED Notes (Signed)
Patient only able to get half of the enema before needing to use restroom.  Patient up to bedside commode at this time.

## 2017-03-28 NOTE — ED Triage Notes (Signed)
PT to ED reporting continued constipation from last night. PT reports she was seen in ED and had a CT done that showed large amounts of stool but no obstruction. Pt reports she was not given an enema and was sent home with medication that did not help. Pt back and requesting an enema. No changes in symptoms reported other than increased pain. No BM today.

## 2017-03-28 NOTE — ED Provider Notes (Signed)
[facility] Emergency Department Provider Note    First MD Initiated Contact with Patient 03/28/17 [phone]     (approximate)  I have reviewed the triage vital signs and the nursing notes.   HISTORY  Chief Complaint Abdominal Pain    HPI Jaclyn Peterson is a 77 y.o. female with below list of chronic medical conditions including previous bowel obstructions presents emergency department with 8 out of 10 generalized abdominal pain accompanied by nausea.  Patient states last bowel movement was 2 days ago.  Patient concern for possible bowel obstruction stating symptoms were similar to previous bowel obstructions.  Patient denies any vomiting no fever.  Patient denies any urine   Past Medical History:  Diagnosis Date  . Celiac disease   . Chronic pain   . Depression   . Diabetes mellitus   . Falls frequently    Falls/pneumonia 1/12  . Hyperlipidemia   . Hypertension   . Migraines   . Neuropathy   . Osteoporosis   . Pericarditis   . Pneumonia   . SBO (small bowel obstruction) (HCC)    obstipation  10/2005  . Uterine cancer ([place])    P32 INSERT  . Vertigo     Patient Active Problem List   Diagnosis Date Noted  . Pedal edema 03/17/2017  . Left shoulder pain 12/15/2016  . Fall with injury 12/15/2016  . Benign paroxysmal positional vertigo 12/22/2015  . Advanced directives, counseling/discussion 11/01/2013  . Routine general medical examination at a health care facility 08/11/2011  . Type 2 diabetes mellitus with polyneuropathy ([place]) 09/02/2006  . HYPERLIPIDEMIA 08/29/2006  . Episodic mood disorder ([place]) 08/29/2006  . PAIN, CHRONIC NEC 08/29/2006  . Essential hypertension, benign 08/29/2006  . OSTEOPOROSIS 08/29/2006  . UTERINE CANCER, HX OF 08/29/2006    Past Surgical History:  Procedure Laterality Date  . ABDOMINAL HYSTERECTOMY     BSO 1985  cancer  . APPENDECTOMY     1946  Adhesions shortly after  . BACK SURGERY  1970's  . BLADDER  REPAIR     bladder tacking- chronic pain since  1998  . CATARACT EXTRACTION  12/12   left and then laser---Dr Tobe Sos  . CATARACT EXTRACTION W/PHACO Right 06/07/2016   Procedure: CATARACT EXTRACTION PHACO AND INTRAOCULAR LENS PLACEMENT (IOC);  Surgeon: Birder Robson, MD;  Location: [facility] ORS;  Service: Ophthalmology;  Laterality: Right;  [place] 00:43 AP% 20.6 CDE 8.96 Fluid pack lot # [id number]  . CHOLECYSTECTOMY     lysis of adhesions--post op complications, trach, etc  8/10  . KIDNEY STONE SURGERY     cysto-- [place] 2003    Prior to Admission medications   Medication Sig Start Date End Date Taking? Authorizing Provider  ACCU-CHEK AVIVA PLUS test strip USE AS DIRECTED 03/08/17   Viviana Simpler I, MD  amLODipine (NORVASC) 10 MG tablet TAKE 1 TABLET(10 MG) BY MOUTH DAILY 04/11/16   Viviana Simpler I, MD  aspirin 81 MG tablet Take 81 mg by mouth daily.      [provider]  Blood Glucose Monitoring Suppl (ACCU-CHEK AVIVA PLUS) w/Device KIT TEST BLOOD SUGAR DAILY AS DIRECTED 03/08/17   Venia Carbon, MD  cholestyramine (QUESTRAN) 4 g packet MIX AND DRINK 1 PACKET BY MOUTH DAILY AS DIRECTED 03/20/17   Venia Carbon, MD  diazepam (VALIUM) 2 MG tablet Take 1 tablet (2 mg total) by mouth 2 (two) times daily as needed. 03/06/17   Venia Carbon, MD  DULoxetine (CYMBALTA) 60  MG capsule TAKE 1 CAPSULE BY MOUTH TWICE DAILY 02/09/17   Viviana Simpler I, MD  FLUoxetine (PROZAC) 40 MG capsule TAKE 1 CAPSULE BY MOUTH EVERY DAY 12/29/16   Viviana Simpler I, MD  glipiZIDE (GLUCOTROL) 5 MG tablet TAKE 1 TABLET BY MOUTH TWICE DAILY BEFORE A MEAL 01/24/17   Venia Carbon, MD  JANUVIA 100 MG tablet TAKE 1 TABLET(100 MG) BY MOUTH DAILY 01/24/17   Venia Carbon, MD  Lancets (ACCU-CHEK SOFT TOUCH) lancets Use as instructed.  Dx code:  E11.40 02/17/16   Viviana Simpler I, MD  losartan (COZAAR) 25 MG tablet TAKE 1 TABLET(25 MG) BY MOUTH DAILY 04/18/16   Venia Carbon, MD  meclizine  (ANTIVERT) 25 MG tablet TAKE 1 TABLET(25 MG) BY MOUTH THREE TIMES DAILY AS NEEDED FOR VERTIGO 03/23/17   Viviana Simpler I, MD  metFORMIN (GLUCOPHAGE-XR) 500 MG 24 hr tablet TAKE 4 TABLETS BY MOUTH EVERY DAY WITH BREAKFAST 12/29/16   Venia Carbon, MD  propranolol (INDERAL) 20 MG tablet Take 1 tablet (20 mg total) by mouth 2 (two) times daily. 03/15/17   Venia Carbon, MD  simvastatin (ZOCOR) 20 MG tablet TAKE 1 TABLET(20 MG) BY MOUTH DAILY 02/24/17   Viviana Simpler I, MD  tiZANidine (ZANAFLEX) 2 MG tablet TAKE 1 TABLET BY MOUTH TWICE DAILY AS NEEDED FOR MUSCLE SPASMS 03/06/17   Venia Carbon, MD  traMADol (ULTRAM) 50 MG tablet Take 1 tablet (50 mg total) by mouth 3 (three) times daily as needed. for pain 03/06/17   Viviana Simpler I, MD    Allergies Aspirin; Bisoprolol fumarate; Chlorthalidone; Lisinopril; Sulfa antibiotics; and Tetracyclines & related  Family History  Problem Relation Age of Onset  . Coronary artery disease Father   . Hypertension Neg Hx   . Diabetes Neg Hx     Social History Social History   Tobacco Use  . Smoking status: Current Some Day Smoker  . Smokeless tobacco: Never Used  . Tobacco comment: only once in a while. Gave info on 1-800-QUIT NOW  Substance Use Topics  . Alcohol use: No    Alcohol/week: 0.0 oz  . Drug use: No    Review of Systems Constitutional: No fever/chills Eyes: No visual changes. ENT: No sore throat. Cardiovascular: Denies chest pain. Respiratory: Denies shortness of breath. Gastrointestinal: Positive for abdominal pain.  No nausea, no vomiting.  No diarrhea.  No constipation. Genitourinary: Negative for dysuria. Musculoskeletal: Negative for neck pain.  Negative for back pain. Integumentary: Negative for rash. Neurological: Negative for headaches, focal weakness or numbness.  ____________________________________________   PHYSICAL EXAM:  VITAL SIGNS: ED Triage Vitals [03/28/17 0403]  Enc Vitals Group     BP (!)  174/146     Pulse Rate 68     Resp 18     Temp 97.9 F (36.6 C)     Temp Source Oral     SpO2 100 %     Weight 72.6 kg (160 lb)     Height 1.651 m (5' 5" )     Head Circumference      Peak Flow      Pain Score 10     Pain Loc      Pain Edu?      Excl. in [place]?     Constitutional: Alert and oriented. Well appearing and in no acute distress. Eyes: Conjunctivae are normal.  Head: Atraumatic. Mouth/Throat: Mucous membranes are moist.  Oropharynx non-erythematous. Neck: No stridor.  Cardiovascular: Normal rate, regular rhythm.  Good peripheral circulation. Grossly normal heart sounds. Respiratory: Normal respiratory effort.  No retractions. Lungs CTAB. Gastrointestinal: Soft and nontender. No distention.  Musculoskeletal: No lower extremity tenderness nor edema. No gross deformities of extremities. Neurologic:  Normal speech and language. No gross focal neurologic deficits are appreciated.  Skin:  Skin is warm, dry and intact. No rash noted. Psychiatric: Mood and affect are normal. Speech and behavior are normal.  ____________________________________________   LABS (all labs ordered are listed, but only abnormal results are displayed)  Labs Reviewed  COMPREHENSIVE METABOLIC PANEL - Abnormal; Notable for the following components:      Result Value   CO2 19 (*)    Glucose, Bld 159 (*)    BUN 26 (*)    Creatinine, Ser 1.16 (*)    GFR calc non Af Amer 44 (*)    GFR calc Af Amer 51 (*)    All other components within normal limits  CBC - Abnormal; Notable for the following components:   WBC 13.3 (*)    All other components within normal limits  TROPONIN I  LIPASE, BLOOD  URINALYSIS, COMPLETE (UACMP) WITH MICROSCOPIC   ____________________________________________  EKG  ED ECG REPORT I, [place] N Yunior Jain, the attending physician, personally viewed and interpreted this ECG.   Date: 03/28/2017  EKG Time: 4:16 AM  Rate: 64  Rhythm: Normal sinus rhythm  Axis: Normal   Intervals: Normal  ST&T Change: None  ____________________________________________  RADIOLOGY I,  N Arty Lantzy, personally viewed and evaluated these images (plain radiographs) as part of my medical decision making, as well as reviewing the written report by the radiologist.  Ct Abdomen Pelvis W Contrast  Result Date: 03/28/2017 CLINICAL DATA:  77 year old female with abdominal pain.  Nausea. EXAM: CT ABDOMEN AND PELVIS WITH CONTRAST TECHNIQUE: Multidetector CT imaging of the abdomen and pelvis was performed using the standard protocol following bolus administration of intravenous contrast. CONTRAST:  31m ISOVUE-300 IOPAMIDOL (ISOVUE-300) INJECTION 61% COMPARISON:  Abdominal radiograph dated 05/11/2012 and CT dated 11/15/2008 FINDINGS: Lower chest: Minimal bibasilar atelectasis/scarring. No intra-abdominal free air or free fluid. Hepatobiliary: The liver is unremarkable. Cholecystectomy. There is mild intrahepatic biliary ductal dilatation. The common bile duct is distended measuring up to 2 cm in diameter. A 7 mm linear high attenuating density in the central CBD likely represents a surgical clip and less likely a stone. This does not appear to be obstructive. Overall there has been interval increase in the dilatation of the common bile duct compared to the prior CT. Pancreas: Unremarkable. No pancreatic ductal dilatation or surrounding inflammatory changes. Spleen: Normal in size without focal abnormality. Adrenals/Urinary Tract: The adrenal glands are unremarkable. Left renal cysts measure up to 2.6 cm. Multiple subcentimeter bilateral renal hypodense lesions are too small to characterize. There is no hydronephrosis on either side. The visualized ureters appear unremarkable. The urinary bladder is distended. Air noted within the urinary bladder likely related to recent instrumentation. Clinical correlation is recommended. Stomach/Bowel: Moderate amount of stool noted throughout the colon. There is  no bowel obstruction or active inflammation. Appendectomy. Vascular/Lymphatic: There is advanced aortoiliac atherosclerotic disease. There is a 2 cm ectasia of infrarenal abdominal aorta. Surgical clips with associated streak artifact in the pelvis and along the aorta. The IVC is grossly unremarkable as visualized. The SMV, splenic vein, and main portal vein are patent. Reproductive: Hysterectomy. Other: Midline vertical anterior abdominal wall incisional scar. There is abutment of loops of small bowel to the anterior peritoneal wall compatible with adhesions. Musculoskeletal: Osteopenia  with degenerative changes of the spine. Mild old compression deformity of the superior endplate of L2. Chronic changes and deformity of the right iliac bone may be related to prior surgery or bone harvest. No acute fracture. IMPRESSION: 1. Moderate colonic stool burden. No bowel obstruction or active inflammation. 2. Dilatation of the intrahepatic biliary tree and CBD with interval progression compared to the prior CT. A 7 mm density in the central CBD appears to be metallic and likely surgical clips and less likely a stone. No associated obstruction. Decrease in the dilatation of the CBD compared to the prior CT may represent natural progression post cholecystectomy. MRCP may provide better evaluation if clinically indicated. 3.  Aortic Atherosclerosis (ICD10-I70.0). Electronically Signed   By: Anner Crete M.D.   On: 03/28/2017 06:04      Procedures   ____________________________________________   INITIAL IMPRESSION / ASSESSMENT AND PLAN / ED COURSE  As part of my medical decision making, I reviewed the following data within the electronic MEDICAL RECORD [id number] year old female presenting with above-stated history and physical exam generalized abdominal discomfort with concern for possible bowel obstruction versus other intra-abdominal pathology.  CT scan revealed moderate stool burden without any evidence of bowel  obstruction. ____________________________________________  FINAL CLINICAL IMPRESSION(S) / ED DIAGNOSES  Final diagnoses:  Constipation, unspecified constipation type     MEDICATIONS GIVEN DURING THIS VISIT:  Medications  morphine 4 MG/ML injection 4 mg (4 mg Intravenous Given 03/28/17 0438)  ondansetron (ZOFRAN) injection 4 mg (4 mg Intravenous Given 03/28/17 0436)  iopamidol (ISOVUE-300) 61 % injection 30 mL (30 mLs Oral Contrast Given 03/28/17 0449)  iopamidol (ISOVUE-300) 61 % injection 75 mL (75 mLs Intravenous Contrast Given 03/28/17 0530)     ED Discharge Orders    None       Note:  This document was prepared using Dragon voice recognition software and may include unintentional dictation errors.    Gregor Hams, MD 03/28/17 [phone]

## 2017-03-29 ENCOUNTER — Telehealth: Payer: Self-pay

## 2017-03-29 MED ORDER — DICYCLOMINE HCL 10 MG/ML IM SOLN
20.0000 mg | Freq: Once | INTRAMUSCULAR | Status: AC
  Administered 2017-03-29: 20 mg via INTRAMUSCULAR
  Filled 2017-03-29: qty 2

## 2017-03-29 MED ORDER — LACTULOSE 10 GM/15ML PO SOLN: 20.0000 g | Freq: Every day | ORAL | 0 refills | Status: DC | PRN

## 2017-03-29 MED ORDER — LACTULOSE 10 GM/15ML PO SOLN
30.0000 g | Freq: Once | ORAL | Status: AC
  Administered 2017-03-29: 30 g via ORAL
  Filled 2017-03-29: qty 60

## 2017-03-29 MED ORDER — ONDANSETRON 4 MG PO TBDP
4.0000 mg | ORAL_TABLET | Freq: Once | ORAL | Status: AC
  Administered 2017-03-29: 4 mg via ORAL
  Filled 2017-03-29: qty 1

## 2017-03-29 MED ORDER — ONDANSETRON 4 MG PO TBDP: 4.0000 mg | ORAL_TABLET | Freq: Three times a day (TID) | ORAL | 0 refills | Status: AC | PRN

## 2017-03-29 MED ORDER — DICYCLOMINE HCL 20 MG PO TABS: 20.0000 mg | ORAL_TABLET | Freq: Four times a day (QID) | ORAL | 0 refills | Status: DC | PRN

## 2017-03-29 NOTE — ED Provider Notes (Signed)
-----------------------------------------   1:15 AM on 03/29/2017 -----------------------------------------  Care assumed from [place].  In summary, this is a 76 year old female who returns to the ED for persistent constipation, abdominal pain and nausea.  Patient states was seen in the ED 1/15, had a CT scan and was found to have a moderate stool burden without obstruction. She was discharged home with Senokot.  Returns tonight stating no benefit from Senokot.  Denies worsening or different symptoms.  Denies active vomiting.  Patient was given SMOG enema and Flex and transferred to the major side at the close of Flex.  Per PA report, there was no stool in the rectal vault to disimpact.  At this time patient is having some liquid stools and declines further enemas.  She desires to be discharged home.  I had a long discussion with the patient and her daughter and we will place her on a bowel regimen including daily MiraLAX and Colace.  Will discharge with prescriptions for Lactulose, Bentyl, Zofran and patient will follow-up closely with her PCP this week.  Strict return precautions given.  Both verbalize understanding and agree with plan of care.   Paulette Blanch, MD 03/29/17 [phone]

## 2017-03-29 NOTE — Telephone Encounter (Signed)
Per Dr Silvio Pate: Should be seen next week if she finally did clear out with the treatment--or in the next 2 days if not   Left message to call office. CRM [phone] created

## 2017-03-29 NOTE — ED Notes (Signed)
Patient rings call bell to say that she wants pain medication. States she feels like if she didn't have so much pain she could have a bowel movement. Daughter at bedside is frustrated with patient asking to go home instead of staying to get "the problem taken care of." Patient seen here yesterday for same issue and sent home with Senokot without relief.

## 2017-03-29 NOTE — ED Notes (Signed)
Patient tolerated IM injection without incident. Will do med hold for 20 mins prior to discharge. Daughter remains at bedside.

## 2017-03-29 NOTE — Discharge Instructions (Signed)
1.  Take these medicines daily to regulate and soften your bowels: MiraLAX (you can find this over-the-counter) Colace twice daily (you can find this over-the-counter) 2.  Take laxative (Lactulose) as needed to have bowel movements. 3.  Take medicines as needed for nausea and intestinal spasms (Zofran/Bentyl). 4.  Drink plenty of fluids and increase your fiber intake daily. 5.  Return to the ER for worsening symptoms, persistent vomiting, difficulty breathing or other concerns.

## 2017-03-29 NOTE — Telephone Encounter (Signed)
Relation to pt: self Call back number: [phone] Pharmacy: [place], [place] [phone] (Phone) [phone] (Fax)     Reason for call:  Informed patient of message below, patient started laxatives 2 hours ago and she remains constipated. Patient denied appt due to transportation concerns and will call back to to schedule appt with PCP.

## 2017-03-30 LAB — URINE CULTURE: Culture: >=100000 — AB

## 2017-03-30 NOTE — Telephone Encounter (Signed)
Left a message to check on her progress today.

## 2017-04-04 ENCOUNTER — Telehealth: Payer: Self-pay | Admitting: Internal Medicine

## 2017-04-04 MED ORDER — TRAMADOL HCL 50 MG PO TABS: 50.0000 mg | ORAL_TABLET | Freq: Three times a day (TID) | ORAL | 0 refills | Status: DC | PRN

## 2017-04-04 MED ORDER — DIAZEPAM 2 MG PO TABS: 2.0000 mg | ORAL_TABLET | Freq: Two times a day (BID) | ORAL | 0 refills | Status: DC | PRN

## 2017-04-04 NOTE — Telephone Encounter (Signed)
Spoke to pt

## 2017-04-04 NOTE — Telephone Encounter (Signed)
Copied from [place]. Topic: Quick Communication - Rx Refill/Question >> Apr 04, 2017 11:53 AM Ahmed Prima L wrote: Medication: traMADol (ULTRAM) 50 MG tablet &  diazepam (VALIUM) 2 MG tablet Has the patient contacted their pharmacy? yes   (Agent: If no, request that the patient contact the pharmacy for the refill.)   Preferred Pharmacy (with phone number or street name): [id number] - [place], [place]   Agent: Please be advised that RX refills may take up to 3 business days. We ask that you follow-up with your pharmacy.

## 2017-04-04 NOTE — Telephone Encounter (Signed)
Last office visit 03/17/17/acute Last refill Tramadol 03/06/17 #90 Last refill Diazepam 03/06/17 #60

## 2017-04-24 ENCOUNTER — Other Ambulatory Visit: Payer: Self-pay | Admitting: Internal Medicine

## 2017-05-04 ENCOUNTER — Other Ambulatory Visit: Payer: Self-pay | Admitting: Internal Medicine

## 2017-05-04 NOTE — Telephone Encounter (Signed)
Both meds last filled: 04/04/17 Tramadol #90 Diazepam #60 Last OV: 12/15/16 No future OV

## 2017-05-04 NOTE — Telephone Encounter (Signed)
Have her set up AMW within the next 3 months

## 2017-05-10 NOTE — Telephone Encounter (Signed)
Spoke to pt. She said she is no longer driving. She will get with her daughter and see what will work for her and call to schedule the appt

## 2017-05-11 ENCOUNTER — Other Ambulatory Visit: Payer: Self-pay | Admitting: Internal Medicine

## 2017-05-25 ENCOUNTER — Other Ambulatory Visit: Payer: Self-pay | Admitting: Internal Medicine

## 2017-05-26 ENCOUNTER — Telehealth: Payer: Self-pay | Admitting: Internal Medicine

## 2017-05-26 NOTE — Telephone Encounter (Signed)
See below message   Copied from [place] [id number]. Topic: Appointment Scheduling - Scheduling Inquiry for Clinic >> May 26, 2017 10:00 AM Scherrie Gerlach wrote: Reason for CRM:  pt states she does not drive anymore and would like to have her med wellness visit the week of 4/15-4/19 because her daughter is off work that week (she is a Pharmacist, hospital) And her daughter drives her now.  Dr Silvio Pate has appts, but not for medicare well visit. Pt last had 05/11/16. Is it ok to work the pt in that week? Pt does want to do this week Dr Silvio Pate

## 2017-05-26 NOTE — Telephone Encounter (Signed)
Yes, she needs to get in . Find what you can that week. Thanks!

## 2017-05-26 NOTE — Telephone Encounter (Signed)
Left message asking pt to call office  °

## 2017-06-01 ENCOUNTER — Other Ambulatory Visit: Payer: Self-pay | Admitting: Internal Medicine

## 2017-06-01 NOTE — Telephone Encounter (Signed)
Tramadol last filled on 05/04/17 #90 tabs with 0 refills, diazepam last filled on 05/04/17 #60 tabs with 0 refills, medicare wellness scheduled for 06/28/17

## 2017-06-09 ENCOUNTER — Other Ambulatory Visit: Payer: Self-pay

## 2017-06-09 ENCOUNTER — Emergency Department: Payer: Medicare Other

## 2017-06-09 ENCOUNTER — Emergency Department
Admission: EM | Admit: 2017-06-09 | Discharge: 2017-06-09 | Disposition: A | Discharge status: Home / Self Care | Payer: Medicare Other | Attending: Emergency Medicine | Admitting: Emergency Medicine

## 2017-06-09 ENCOUNTER — Encounter: Payer: Self-pay | Admitting: Emergency Medicine

## 2017-06-09 DIAGNOSIS — Y999 Unspecified external cause status: Secondary | ICD-10-CM | POA: Insufficient documentation

## 2017-06-09 DIAGNOSIS — F17200 Nicotine dependence, unspecified, uncomplicated: Secondary | ICD-10-CM | POA: Diagnosis not present

## 2017-06-09 DIAGNOSIS — E119 Type 2 diabetes mellitus without complications: Secondary | ICD-10-CM | POA: Diagnosis not present

## 2017-06-09 DIAGNOSIS — Z79899 Other long term (current) drug therapy: Secondary | ICD-10-CM | POA: Insufficient documentation

## 2017-06-09 DIAGNOSIS — W19XXXA Unspecified fall, initial encounter: Secondary | ICD-10-CM | POA: Diagnosis not present

## 2017-06-09 DIAGNOSIS — S22080A Wedge compression fracture of T11-T12 vertebra, initial encounter for closed fracture: Secondary | ICD-10-CM | POA: Diagnosis not present

## 2017-06-09 DIAGNOSIS — Z7982 Long term (current) use of aspirin: Secondary | ICD-10-CM | POA: Diagnosis not present

## 2017-06-09 DIAGNOSIS — Z8542 Personal history of malignant neoplasm of other parts of uterus: Secondary | ICD-10-CM | POA: Diagnosis not present

## 2017-06-09 DIAGNOSIS — E876 Hypokalemia: Secondary | ICD-10-CM | POA: Insufficient documentation

## 2017-06-09 DIAGNOSIS — Y939 Activity, unspecified: Secondary | ICD-10-CM | POA: Insufficient documentation

## 2017-06-09 DIAGNOSIS — Y929 Unspecified place or not applicable: Secondary | ICD-10-CM | POA: Insufficient documentation

## 2017-06-09 DIAGNOSIS — M545 Low back pain: Secondary | ICD-10-CM | POA: Diagnosis present

## 2017-06-09 DIAGNOSIS — R51 Headache: Secondary | ICD-10-CM | POA: Diagnosis not present

## 2017-06-09 LAB — URINALYSIS, COMPLETE (UACMP) WITH MICROSCOPIC
BILIRUBIN URINE: NEGATIVE
GLUCOSE, UA: NEGATIVE mg/dL
Ketones, ur: 20 mg/dL — AB
LEUKOCYTES UA: NEGATIVE
NITRITE: NEGATIVE
PH: 5 (ref 5.0–8.0)
Protein, ur: >=300 mg/dL — AB
RBC / HPF: NONE SEEN RBC/hpf (ref 0–5)
SPECIFIC GRAVITY, URINE: 1.015 (ref 1.005–1.030)
Squamous Epithelial / LPF: NONE SEEN
WBC, UA: NONE SEEN WBC/hpf (ref 0–5)

## 2017-06-09 LAB — CBC WITH DIFFERENTIAL/PLATELET
BASOS ABS: 0.1 10*3/uL (ref 0–0.1)
BASOS PCT: 1 %
EOS ABS: 0.2 10*3/uL (ref 0–0.7)
Eosinophils Relative: 2 %
HCT: 40.3 % (ref 35.0–47.0)
HEMOGLOBIN: 13.5 g/dL (ref 12.0–16.0)
Lymphocytes Relative: 8 %
Lymphs Abs: 1.1 10*3/uL (ref 1.0–3.6)
MCH: 30.7 pg (ref 26.0–34.0)
MCHC: 33.6 g/dL (ref 32.0–36.0)
MCV: 91.4 fL (ref 80.0–100.0)
MONOS PCT: 8 %
Monocytes Absolute: 1.1 10*3/uL — ABNORMAL HIGH (ref 0.2–0.9)
NEUTROS PCT: 81 %
Neutro Abs: 11.3 10*3/uL — ABNORMAL HIGH (ref 1.4–6.5)
Platelets: 383 10*3/uL (ref 150–440)
RBC: 4.41 MIL/uL (ref 3.80–5.20)
RDW: 13.4 % (ref 11.5–14.5)
WBC: 13.9 10*3/uL — ABNORMAL HIGH (ref 3.6–11.0)

## 2017-06-09 LAB — COMPREHENSIVE METABOLIC PANEL
ALK PHOS: 157 U/L — AB (ref 38–126)
ALT: 27 U/L (ref 14–54)
AST: 29 U/L (ref 15–41)
Albumin: 3.4 g/dL — ABNORMAL LOW (ref 3.5–5.0)
Anion gap: 15 (ref 5–15)
BILIRUBIN TOTAL: 1 mg/dL (ref 0.3–1.2)
BUN: 12 mg/dL (ref 6–20)
CALCIUM: 8.9 mg/dL (ref 8.9–10.3)
CO2: 20 mmol/L — AB (ref 22–32)
CREATININE: 0.83 mg/dL (ref 0.44–1.00)
Chloride: 102 mmol/L (ref 101–111)
Glucose, Bld: 177 mg/dL — ABNORMAL HIGH (ref 65–99)
Potassium: 3.1 mmol/L — ABNORMAL LOW (ref 3.5–5.1)
SODIUM: 137 mmol/L (ref 135–145)
TOTAL PROTEIN: 7.7 g/dL (ref 6.5–8.1)

## 2017-06-09 LAB — TROPONIN I: TROPONIN I: 0.03 ng/mL — AB (ref <0.03)

## 2017-06-09 MED ORDER — SODIUM CHLORIDE 0.9 % IV SOLN
Freq: Once | INTRAVENOUS | Status: AC
  Administered 2017-06-09: 08:00:00 via INTRAVENOUS

## 2017-06-09 MED ORDER — ONDANSETRON HCL 4 MG/2ML IJ SOLN
INTRAMUSCULAR | Status: AC
  Administered 2017-06-09: 4 mg via INTRAVENOUS
  Filled 2017-06-09: qty 2

## 2017-06-09 MED ORDER — MORPHINE SULFATE (PF) 2 MG/ML IV SOLN
INTRAVENOUS | Status: AC
  Filled 2017-06-09: qty 1

## 2017-06-09 MED ORDER — MORPHINE SULFATE (PF) 2 MG/ML IV SOLN
2.0000 mg | Freq: Once | INTRAVENOUS | Status: AC
  Administered 2017-06-09: 2 mg via INTRAVENOUS

## 2017-06-09 MED ORDER — MORPHINE SULFATE (PF) 2 MG/ML IV SOLN
INTRAVENOUS | Status: AC
Start: 2017-06-09 — End: 2017-06-09
  Administered 2017-06-09: 2 mg via INTRAVENOUS
  Filled 2017-06-09: qty 1

## 2017-06-09 MED ORDER — DIAZEPAM 2 MG PO TABS: ORAL_TABLET | ORAL | 0 refills | Status: DC

## 2017-06-09 MED ORDER — MORPHINE SULFATE (PF) 4 MG/ML IV SOLN
4.0000 mg | Freq: Once | INTRAVENOUS | Status: AC
  Administered 2017-06-09: 4 mg via INTRAVENOUS
  Filled 2017-06-09: qty 1

## 2017-06-09 MED ORDER — POTASSIUM CHLORIDE CRYS ER 20 MEQ PO TBCR
40.0000 meq | EXTENDED_RELEASE_TABLET | Freq: Once | ORAL | Status: AC
  Administered 2017-06-09: 40 meq via ORAL
  Filled 2017-06-09: qty 2

## 2017-06-09 MED ORDER — KETOROLAC TROMETHAMINE 30 MG/ML IJ SOLN
30.0000 mg | Freq: Once | INTRAMUSCULAR | Status: AC
  Administered 2017-06-09: 30 mg via INTRAVENOUS
  Filled 2017-06-09: qty 1

## 2017-06-09 MED ORDER — ONDANSETRON HCL 4 MG/2ML IJ SOLN
4.0000 mg | Freq: Once | INTRAMUSCULAR | Status: AC
  Administered 2017-06-09: 4 mg via INTRAVENOUS

## 2017-06-09 MED ORDER — TRAMADOL HCL 50 MG PO TABS: ORAL_TABLET | ORAL | 0 refills | Status: DC

## 2017-06-09 MED ORDER — TRAMADOL HCL 50 MG PO TABS
50.0000 mg | ORAL_TABLET | Freq: Once | ORAL | Status: AC
  Administered 2017-06-09: 50 mg via ORAL
  Filled 2017-06-09: qty 1

## 2017-06-09 NOTE — ED Notes (Addendum)
Per daughter, pt had "tramadol and valium filled on 3/21 and has no more left at this time". MD made aware

## 2017-06-09 NOTE — ED Notes (Signed)
PT up to wheelchair at departure, daughter at bedside , signed for pt. PT in NAD

## 2017-06-09 NOTE — Discharge Instructions (Addendum)
Return to emergency department immediately for any worsening or uncontrolled pain, weakness, numbness, or any other symptoms concerning to you.

## 2017-06-09 NOTE — ED Notes (Signed)
Patient transported to X-ray 

## 2017-06-09 NOTE — ED Provider Notes (Signed)
[facility] Emergency Department Provider Note ____________________________________________   I have reviewed the triage vital signs and the triage nursing note.  HISTORY  Chief Complaint Fall   Historian Patient  HPI Jaclyn Peterson is a 77 y.o. female who lives at home with her daughter and son-in-law, had a full and struck her back of the head on this past Saturday, almost a week ago and also has been complaining of mid/lower back pain since that time.  She has been walking but the pain in the back has gotten significantly worse and now she is really having trouble getting up and out of bed on her own.  She was not evaluated at the time.  She is now not been eating and drinking very well and her mouth is extremely dry.  No chest pain or abdominal pain.  In the back is moderate to severe and worse with movement.     Past Medical History:  Diagnosis Date  . Celiac disease   . Chronic pain   . Depression   . Diabetes mellitus   . Falls frequently    Falls/pneumonia 1/12  . Hyperlipidemia   . Hypertension   . Migraines   . Neuropathy   . Osteoporosis   . Pericarditis   . Pneumonia   . SBO (small bowel obstruction) (HCC)    obstipation  10/2005  . Uterine cancer ([place])    P32 INSERT  . Vertigo     Patient Active Problem List   Diagnosis Date Noted  . Pedal edema 03/17/2017  . Left shoulder pain 12/15/2016  . Fall with injury 12/15/2016  . Benign paroxysmal positional vertigo 12/22/2015  . Advanced directives, counseling/discussion 11/01/2013  . Routine general medical examination at a health care facility 08/11/2011  . Type 2 diabetes mellitus with polyneuropathy ([place]) 09/02/2006  . HYPERLIPIDEMIA 08/29/2006  . Episodic mood disorder ([place]) 08/29/2006  . PAIN, CHRONIC NEC 08/29/2006  . Essential hypertension, benign 08/29/2006  . OSTEOPOROSIS 08/29/2006  . UTERINE CANCER, HX OF 08/29/2006    Past Surgical History:  Procedure  Laterality Date  . ABDOMINAL HYSTERECTOMY     BSO 1985  cancer  . APPENDECTOMY     1946  Adhesions shortly after  . BACK SURGERY  1970's  . BLADDER REPAIR     bladder tacking- chronic pain since  1998  . CATARACT EXTRACTION  12/12   left and then laser---Dr Tobe Sos  . CATARACT EXTRACTION W/PHACO Right 06/07/2016   Procedure: CATARACT EXTRACTION PHACO AND INTRAOCULAR LENS PLACEMENT (IOC);  Surgeon: Birder Robson, MD;  Location: [facility] ORS;  Service: Ophthalmology;  Laterality: Right;  [place] 00:43 AP% 20.6 CDE 8.96 Fluid pack lot # [id number]  . CHOLECYSTECTOMY     lysis of adhesions--post op complications, trach, etc  8/10  . KIDNEY STONE SURGERY     cysto-- [place] 2003    Prior to Admission medications   Medication Sig Start Date End Date Taking? Authorizing Provider  ACCU-CHEK AVIVA PLUS test strip USE AS DIRECTED 03/08/17  Yes Viviana Simpler I, MD  amLODipine (NORVASC) 10 MG tablet TAKE 1 TABLET(10 MG) BY MOUTH DAILY 04/24/17  Yes Venia Carbon, MD  aspirin 81 MG tablet Take 81 mg by mouth daily.     Yes [provider]  Blood Glucose Monitoring Suppl (ACCU-CHEK AVIVA PLUS) w/Device KIT TEST BLOOD SUGAR DAILY AS DIRECTED 03/08/17  Yes Venia Carbon, MD  cholestyramine (QUESTRAN) 4 g packet MIX AND DRINK 1 PACKET BY MOUTH DAILY  AS DIRECTED 03/20/17  Yes Venia Carbon, MD  dicyclomine (BENTYL) 20 MG tablet Take 1 tablet (20 mg total) by mouth every 6 (six) hours as needed. 03/29/17  Yes Paulette Blanch, MD  DULoxetine (CYMBALTA) 60 MG capsule TAKE 1 CAPSULE BY MOUTH TWICE DAILY 05/11/17  Yes Viviana Simpler I, MD  FLUoxetine (PROZAC) 40 MG capsule TAKE 1 CAPSULE BY MOUTH EVERY DAY 12/29/16  Yes Viviana Simpler I, MD  glipiZIDE (GLUCOTROL) 5 MG tablet TAKE 1 TABLET BY MOUTH TWICE DAILY BEFORE A MEAL 01/24/17  Yes Venia Carbon, MD  JANUVIA 100 MG tablet TAKE 1 TABLET(100 MG) BY MOUTH DAILY 01/24/17  Yes Venia Carbon, MD  lactulose (CHRONULAC) 10 GM/15ML solution  Take 30 mLs (20 g total) by mouth daily as needed for mild constipation. 03/29/17  Yes Paulette Blanch, MD  Lancets (ACCU-CHEK SOFT TOUCH) lancets Use as instructed.  Dx code:  E11.40 02/17/16  Yes Venia Carbon, MD  losartan (COZAAR) 25 MG tablet TAKE 1 TABLET(25 MG) BY MOUTH DAILY 04/24/17  Yes Venia Carbon, MD  meclizine (ANTIVERT) 25 MG tablet TAKE 1 TABLET(25 MG) BY MOUTH THREE TIMES DAILY AS NEEDED FOR VERTIGO 03/23/17  Yes Venia Carbon, MD  metFORMIN (GLUCOPHAGE-XR) 500 MG 24 hr tablet TAKE 4 TABLETS BY MOUTH EVERY DAY WITH BREAKFAST 12/29/16  Yes Venia Carbon, MD  ondansetron (ZOFRAN ODT) 4 MG disintegrating tablet Take 1 tablet (4 mg total) by mouth every 8 (eight) hours as needed for nausea or vomiting. 03/29/17  Yes Paulette Blanch, MD  propranolol (INDERAL) 20 MG tablet Take 1 tablet (20 mg total) by mouth 2 (two) times daily. 03/15/17  Yes Viviana Simpler I, MD  simvastatin (ZOCOR) 20 MG tablet TAKE 1 TABLET(20 MG) BY MOUTH DAILY 05/25/17  Yes Viviana Simpler I, MD  tiZANidine (ZANAFLEX) 2 MG tablet TAKE 1 TABLET BY MOUTH TWICE DAILY AS NEEDED FOR MUSCLE SPASMS 03/06/17  Yes Viviana Simpler I, MD  diazepam (VALIUM) 2 MG tablet TAKE 1 TABLET(2 MG) BY MOUTH TWICE DAILY AS NEEDED 06/09/17   Lisa Roca, MD  traMADol (ULTRAM) 50 MG tablet TAKE 1 TABLET(50 MG) BY MOUTH THREE TIMES DAILY AS NEEDED FOR PAIN 06/09/17   Lisa Roca, MD    Allergies  Allergen Reactions  . Aspirin     Able to take baby aspirin  . Bisoprolol Fumarate     REACTION: ha  . Chlorthalidone   . Lisinopril     REACTION: throat symptoms  . Sulfa Antibiotics     Told by mother as a child  . Tetracyclines & Related Swelling    Throat swelling    Family History  Problem Relation Age of Onset  . Coronary artery disease Father   . Hypertension Neg Hx   . Diabetes Neg Hx     Social History Social History   Tobacco Use  . Smoking status: Current Some Day Smoker  . Smokeless tobacco: Never Used  .  Tobacco comment: only once in a while. Gave info on 1-800-QUIT NOW  Substance Use Topics  . Alcohol use: No    Alcohol/week: 0.0 oz  . Drug use: No    Review of Systems  Constitutional: Negative for fever. Eyes: Negative for visual changes. ENT: Negative for sore throat. Cardiovascular: Negative for chest pain. Respiratory: Negative for shortness of breath. Gastrointestinal: Negative for abdominal pain, vomiting and diarrhea. Genitourinary: Negative for dysuria. Musculoskeletal: Positive as per HPI for back pain. Skin: Negative for  rash. Neurological: Negative for headache.  ____________________________________________   PHYSICAL EXAM:  VITAL SIGNS: ED Triage Vitals  Enc Vitals Group     BP 06/09/17 0734 (!) 133/102     Pulse Rate 06/09/17 0734 (!) 119     Resp 06/09/17 0734 (!) 24     Temp 06/09/17 0734 98.1 F (36.7 C)     Temp Source 06/09/17 0734 Oral     SpO2 06/09/17 0734 91 %     Weight 06/09/17 0735 162 lb (73.5 kg)     Height 06/09/17 0735 _0  (1.651 m)     Head Circumference --      Peak Flow --      Pain Score 06/09/17 0734 10     Pain Loc --      Pain Edu? --      Excl. in [place]? --      Constitutional: Alert and oriented.  Really complaining of a lot of pain in her low back. HEENT      Head: Normocephalic and atraumatic.      Eyes: Conjunctivae are normal. Pupils equal and round.       Ears:         Nose: No congestion/rhinnorhea.      Mouth/Throat: Mucous membranes are generally dry.      Neck: No stridor.  Nontender C-spine to palpation and range of motion. Cardiovascular/Chest: Normal rate, regular rhythm.  No murmurs, rubs, or gallops. Respiratory: Normal respiratory effort without tachypnea nor retractions. Breath sounds are clear and equal bilaterally. No wheezes/rales/rhonchi. Gastrointestinal: Soft. No distention, no guarding, no rebound. Nontender.    Genitourinary/rectal:Deferred Musculoskeletal: Mid low back pain medially and  paraspinous.  No step-offs.  Pelvis stable.  She is moving her hips without pain.  No upper extremity injuries.  Neurologic:  Normal speech and language. No gross or focal neurologic deficits are appreciated. Skin:  Skin is warm, dry and intact. No rash noted. Psychiatric: Mood and affect are normal. Speech and behavior are normal. Patient exhibits appropriate insight and judgment.   ____________________________________________  LABS (pertinent positives/negatives) I, Lisa Roca, MD the attending physician have reviewed the labs noted below.  Labs Reviewed  URINALYSIS, COMPLETE (UACMP) WITH MICROSCOPIC - Abnormal; Notable for the following components:      Result Value   Color, Urine AMBER (*)    APPearance CLOUDY (*)    Hgb urine dipstick SMALL (*)    Ketones, ur 20 (*)    Protein, ur >=300 (*)    Bacteria, UA MANY (*)    All other components within normal limits  COMPREHENSIVE METABOLIC PANEL - Abnormal; Notable for the following components:   Potassium 3.1 (*)    CO2 20 (*)    Glucose, Bld 177 (*)    Albumin 3.4 (*)    Alkaline Phosphatase 157 (*)    All other components within normal limits  CBC WITH DIFFERENTIAL/PLATELET - Abnormal; Notable for the following components:   WBC 13.9 (*)    Neutro Abs 11.3 (*)    Monocytes Absolute 1.1 (*)    All other components within normal limits  TROPONIN I - Abnormal; Notable for the following components:   Troponin I 0.03 (*)    All other components within normal limits    ____________________________________________    EKG I, Lisa Roca, MD, the attending physician have personally viewed and interpreted all ECGs.  76 bpm.  Nonspecific intraventricular conduction delay.  Normal sinus rhythm.  Normal axis.  Nonspecific ST and  T wave ____________________________________________  RADIOLOGY   CT head without contrast interpreted by radiologist:  IMPRESSION: Atrophy with supratentorial small vessel disease. No acute  infarct. No mass or hemorrhage.  There are foci of arterial vascular calcification. There are foci of paranasal sinus disease.  DG lumbar spine radiologist to rotation:  IMPRESSION: Stable mild anterior wedging of the L2 vertebral body. No new fracture. No spondylolisthesis. No appreciable arthropathy. There is aortic atherosclerosis.  Aortic Atherosclerosis (ICD10-I70.0).  DG thoracic spine:  IMPRESSION: Mild scoliosis. Mild osteoarthritic change at several levels. No fracture or spondylolisthesis.  CT lumbar spine radiologist interpretation:  IMPRESSION: Acute/subacute superior endplate fracture at Q65 with loss of height of 10-20%. Minimal posterior bowing of the posterosuperior margin of the vertebral body but no protamine upon the neural structures.  Old healed superior endplate fracture at L2.  Degenerative changes at L4-5 with shallow disc herniation and facet and ligamentous hypertrophy with lateral recess stenosis that could possibly cause neural compression. __________________________________________  PROCEDURES  Procedure(s) performed: None  Procedures  Critical Care performed: None   ____________________________________________  ED COURSE / ASSESSMENT AND PLAN  Pertinent labs & imaging results that were available during my care of the patient were reviewed by me and considered in my medical decision making (see chart for details).    Concern for likely compression fracture based on persistence of pain after about 1 week after fall onto her low back.  She does have chronic low back pain and apparently had gone through her home chronic pain medications.  Patient's mouth is extremely dry and she was started on IV fluid bolus.  Laboratory studies are overall reassuring without any acute renal failure.  Slight hypokalemia she was given repletion p.o. here.  Patient continues to have significant pain, initially started with lower doses of morphine, received 2  mg then 2 mg and 4 mg and she still in pain.  Her x-rays were read as negative by the radiologist, there is some possible height loss at T12 to my view, and so I did elect to get a CT which did confirm slight compression of T12 consistent with compression fracture.  We will try to get the patient slightly more pain control.  Patient states that she did take extra of the diazepam and the Ultram which were prescribed about a week ago by her primary care doctor, however she states that most of them fell into a box when she is emptying them such that she did not take the entire 60 tablets of diazepam and 90 tablets of Ultram which were prescribed.  In any case she does not think that she has those pills available.  I discussed with the daughter that the daughter needs to go home immediately and try to find any of those loss pills.  And she needs to be in control of offering the pain medication because of the concern that the patient herself was taking extra.  I am prescribing just enough for 5 days.  Daughter understands the severity and making sure that the patient is being dispensed her medications and that there are no extra pills available or spelled for her to take.   CONSULTATIONS:   None   Patient / Family / Caregiver informed of clinical course, medical decision-making process, and agree with plan.   I discussed return precautions, follow-up instructions, and discharge instructions with patient and/or family.  Discharge Instructions : Return to emergency department immediately for any worsening or uncontrolled pain, weakness, numbness, or any other symptoms  concerning to you.    ___________________________________________   FINAL CLINICAL IMPRESSION(S) / ED DIAGNOSES   Final diagnoses:  Hypokalemia  T12 compression fracture ([place])      ___________________________________________         Note: This dictation was prepared with Dragon dictation. Any transcriptional errors  that result from this process are unintentional    Lisa Roca, MD 06/09/17 1203

## 2017-06-09 NOTE — ED Notes (Signed)
ED Provider at bedside. 

## 2017-06-09 NOTE — ED Triage Notes (Signed)
Arrives c/o fall on Saturday.  States fell in bedroom, backward against stand in closet.  C/O low back pain.  Denies LOC with fall.  Reports no BM since Saturday.

## 2017-06-09 NOTE — ED Notes (Signed)
Pt continues to call out for pain meds, RN informed pt that she has been given pain meds and will continue to monitor. PT restless in bed, screaming out. NSR, VSS

## 2017-06-09 NOTE — ED Notes (Signed)
Pt changed at this time of urine, pt clean and dry . Linen change at this time as well

## 2017-06-09 NOTE — ED Notes (Signed)
Date and time results received: 06/09/17 0845 (use smartphrase ".now" to insert current time)  Test: troponin Critical Value: 0.03  Name of Provider Notified: MD Reita Cliche   Orders Received? Or Actions Taken?: No new orders at this time

## 2017-06-09 NOTE — ED Notes (Signed)
Patient transported to CT 

## 2017-06-12 ENCOUNTER — Encounter: Payer: Self-pay | Admitting: Emergency Medicine

## 2017-06-12 ENCOUNTER — Other Ambulatory Visit: Payer: Self-pay

## 2017-06-12 ENCOUNTER — Emergency Department: Payer: Medicare Other

## 2017-06-12 ENCOUNTER — Observation Stay
Admission: EM | Admit: 2017-06-12 | Discharge: 2017-06-15 | Disposition: A | Discharge status: Skilled Nursing Facility | Payer: Medicare Other | Attending: Internal Medicine | Admitting: Internal Medicine

## 2017-06-12 ENCOUNTER — Observation Stay: Payer: Medicare Other

## 2017-06-12 ENCOUNTER — Inpatient Hospital Stay: Payer: Medicare Other | Admitting: Internal Medicine

## 2017-06-12 DIAGNOSIS — E119 Type 2 diabetes mellitus without complications: Secondary | ICD-10-CM | POA: Insufficient documentation

## 2017-06-12 DIAGNOSIS — F419 Anxiety disorder, unspecified: Secondary | ICD-10-CM | POA: Insufficient documentation

## 2017-06-12 DIAGNOSIS — N179 Acute kidney failure, unspecified: Secondary | ICD-10-CM | POA: Diagnosis not present

## 2017-06-12 DIAGNOSIS — Z7984 Long term (current) use of oral hypoglycemic drugs: Secondary | ICD-10-CM | POA: Insufficient documentation

## 2017-06-12 DIAGNOSIS — M549 Dorsalgia, unspecified: Secondary | ICD-10-CM | POA: Diagnosis not present

## 2017-06-12 DIAGNOSIS — R296 Repeated falls: Secondary | ICD-10-CM | POA: Insufficient documentation

## 2017-06-12 DIAGNOSIS — K900 Celiac disease: Secondary | ICD-10-CM | POA: Diagnosis not present

## 2017-06-12 DIAGNOSIS — F17200 Nicotine dependence, unspecified, uncomplicated: Secondary | ICD-10-CM | POA: Diagnosis not present

## 2017-06-12 DIAGNOSIS — I10 Essential (primary) hypertension: Secondary | ICD-10-CM | POA: Diagnosis not present

## 2017-06-12 DIAGNOSIS — F329 Major depressive disorder, single episode, unspecified: Secondary | ICD-10-CM | POA: Diagnosis not present

## 2017-06-12 DIAGNOSIS — W010XXA Fall on same level from slipping, tripping and stumbling without subsequent striking against object, initial encounter: Secondary | ICD-10-CM | POA: Diagnosis not present

## 2017-06-12 DIAGNOSIS — S22088A Other fracture of T11-T12 vertebra, initial encounter for closed fracture: Principal | ICD-10-CM | POA: Insufficient documentation

## 2017-06-12 DIAGNOSIS — Z886 Allergy status to analgesic agent status: Secondary | ICD-10-CM | POA: Insufficient documentation

## 2017-06-12 DIAGNOSIS — T43215A Adverse effect of selective serotonin and norepinephrine reuptake inhibitors, initial encounter: Secondary | ICD-10-CM | POA: Insufficient documentation

## 2017-06-12 DIAGNOSIS — G92 Toxic encephalopathy: Secondary | ICD-10-CM | POA: Diagnosis not present

## 2017-06-12 DIAGNOSIS — F39 Unspecified mood [affective] disorder: Secondary | ICD-10-CM | POA: Insufficient documentation

## 2017-06-12 DIAGNOSIS — Z881 Allergy status to other antibiotic agents status: Secondary | ICD-10-CM | POA: Diagnosis not present

## 2017-06-12 DIAGNOSIS — E785 Hyperlipidemia, unspecified: Secondary | ICD-10-CM | POA: Diagnosis not present

## 2017-06-12 DIAGNOSIS — T43225A Adverse effect of selective serotonin reuptake inhibitors, initial encounter: Secondary | ICD-10-CM | POA: Diagnosis not present

## 2017-06-12 DIAGNOSIS — Y92009 Unspecified place in unspecified non-institutional (private) residence as the place of occurrence of the external cause: Secondary | ICD-10-CM | POA: Diagnosis not present

## 2017-06-12 DIAGNOSIS — Z419 Encounter for procedure for purposes other than remedying health state, unspecified: Secondary | ICD-10-CM

## 2017-06-12 DIAGNOSIS — Z79899 Other long term (current) drug therapy: Secondary | ICD-10-CM | POA: Insufficient documentation

## 2017-06-12 DIAGNOSIS — N3000 Acute cystitis without hematuria: Secondary | ICD-10-CM

## 2017-06-12 DIAGNOSIS — G8929 Other chronic pain: Secondary | ICD-10-CM | POA: Insufficient documentation

## 2017-06-12 DIAGNOSIS — R4182 Altered mental status, unspecified: Secondary | ICD-10-CM | POA: Diagnosis present

## 2017-06-12 DIAGNOSIS — Z882 Allergy status to sulfonamides status: Secondary | ICD-10-CM | POA: Insufficient documentation

## 2017-06-12 DIAGNOSIS — Z8542 Personal history of malignant neoplasm of other parts of uterus: Secondary | ICD-10-CM | POA: Insufficient documentation

## 2017-06-12 DIAGNOSIS — M810 Age-related osteoporosis without current pathological fracture: Secondary | ICD-10-CM | POA: Diagnosis not present

## 2017-06-12 DIAGNOSIS — R410 Disorientation, unspecified: Secondary | ICD-10-CM

## 2017-06-12 DIAGNOSIS — S22089A Unspecified fracture of T11-T12 vertebra, initial encounter for closed fracture: Secondary | ICD-10-CM | POA: Diagnosis present

## 2017-06-12 DIAGNOSIS — Z888 Allergy status to other drugs, medicaments and biological substances status: Secondary | ICD-10-CM | POA: Insufficient documentation

## 2017-06-12 DIAGNOSIS — W19XXXD Unspecified fall, subsequent encounter: Secondary | ICD-10-CM

## 2017-06-12 LAB — COMPREHENSIVE METABOLIC PANEL
ALBUMIN: 3.2 g/dL — AB (ref 3.5–5.0)
ALT: 29 U/L (ref 14–54)
AST: 23 U/L (ref 15–41)
Alkaline Phosphatase: 138 U/L — ABNORMAL HIGH (ref 38–126)
Anion gap: 17 — ABNORMAL HIGH (ref 5–15)
BUN: 29 mg/dL — AB (ref 6–20)
CHLORIDE: 107 mmol/L (ref 101–111)
CO2: 13 mmol/L — ABNORMAL LOW (ref 22–32)
Calcium: 8.9 mg/dL (ref 8.9–10.3)
Creatinine, Ser: 1.13 mg/dL — ABNORMAL HIGH (ref 0.44–1.00)
GFR calc Af Amer: 53 mL/min — ABNORMAL LOW (ref >60)
GFR, EST NON AFRICAN AMERICAN: 45 mL/min — AB (ref >60)
Glucose, Bld: 144 mg/dL — ABNORMAL HIGH (ref 65–99)
POTASSIUM: 3.9 mmol/L (ref 3.5–5.1)
Sodium: 137 mmol/L (ref 135–145)
Total Bilirubin: 1 mg/dL (ref 0.3–1.2)
Total Protein: 6.8 g/dL (ref 6.5–8.1)

## 2017-06-12 LAB — URINE DRUG SCREEN, QUALITATIVE (ARMC ONLY)
Amphetamines, Ur Screen: NOT DETECTED
BARBITURATES, UR SCREEN: NOT DETECTED
Benzodiazepine, Ur Scrn: POSITIVE — AB
CANNABINOID 50 NG, UR ~~LOC~~: NOT DETECTED
Cocaine Metabolite,Ur ~~LOC~~: NOT DETECTED
MDMA (Ecstasy)Ur Screen: NOT DETECTED
Methadone Scn, Ur: NOT DETECTED
OPIATE, UR SCREEN: POSITIVE — AB
PHENCYCLIDINE (PCP) UR S: NOT DETECTED
Tricyclic, Ur Screen: POSITIVE — AB

## 2017-06-12 LAB — URINALYSIS, COMPLETE (UACMP) WITH MICROSCOPIC
BILIRUBIN URINE: NEGATIVE
GLUCOSE, UA: NEGATIVE mg/dL
Hgb urine dipstick: NEGATIVE
KETONES UR: 20 mg/dL — AB
Leukocytes, UA: NEGATIVE
NITRITE: NEGATIVE
PH: 5 (ref 5.0–8.0)
Protein, ur: >=300 mg/dL — AB
Specific Gravity, Urine: 1.024 (ref 1.005–1.030)
Squamous Epithelial / LPF: NONE SEEN

## 2017-06-12 LAB — CBC WITH DIFFERENTIAL/PLATELET
BASOS ABS: 0.1 10*3/uL (ref 0–0.1)
BASOS PCT: 1 %
EOS ABS: 0.1 10*3/uL (ref 0–0.7)
EOS PCT: 0 %
HCT: 42.4 % (ref 35.0–47.0)
Hemoglobin: 13.8 g/dL (ref 12.0–16.0)
Lymphocytes Relative: 8 %
Lymphs Abs: 1.3 10*3/uL (ref 1.0–3.6)
MCH: 30.5 pg (ref 26.0–34.0)
MCHC: 32.6 g/dL (ref 32.0–36.0)
MCV: 93.6 fL (ref 80.0–100.0)
Monocytes Absolute: 1.4 10*3/uL — ABNORMAL HIGH (ref 0.2–0.9)
Monocytes Relative: 8 %
Neutro Abs: 14.2 10*3/uL — ABNORMAL HIGH (ref 1.4–6.5)
Neutrophils Relative %: 83 %
PLATELETS: 496 10*3/uL — AB (ref 150–440)
RBC: 4.52 MIL/uL (ref 3.80–5.20)
RDW: 13.8 % (ref 11.5–14.5)
WBC: 17 10*3/uL — ABNORMAL HIGH (ref 3.6–11.0)

## 2017-06-12 LAB — GLUCOSE, CAPILLARY: Glucose-Capillary: 163 mg/dL — ABNORMAL HIGH (ref 65–99)

## 2017-06-12 LAB — TROPONIN I: Troponin I: 0.04 ng/mL

## 2017-06-12 MED ORDER — SODIUM CHLORIDE 0.9 % IV SOLN
1.0000 g | Freq: Once | INTRAVENOUS | Status: AC
  Administered 2017-06-12: 1 g via INTRAVENOUS
  Filled 2017-06-12: qty 10

## 2017-06-12 MED ORDER — SODIUM CHLORIDE 0.9 % IV SOLN
INTRAVENOUS | Status: DC
  Administered 2017-06-13 (×2): via INTRAVENOUS

## 2017-06-12 MED ORDER — AMLODIPINE BESYLATE 5 MG PO TABS
5.0000 mg | ORAL_TABLET | Freq: Every day | ORAL | Status: DC
  Administered 2017-06-12 – 2017-06-13 (×2): 5 mg via ORAL
  Filled 2017-06-12 (×2): qty 1

## 2017-06-12 MED ORDER — DOCUSATE SODIUM 100 MG PO CAPS: 100.0000 mg | ORAL_CAPSULE | Freq: Two times a day (BID) | ORAL | Status: DC | PRN

## 2017-06-12 MED ORDER — LORAZEPAM 2 MG/ML IJ SOLN
0.5000 mg | Freq: Once | INTRAMUSCULAR | Status: AC
  Administered 2017-06-12: 0.5 mg via INTRAVENOUS
  Filled 2017-06-12: qty 1

## 2017-06-12 MED ORDER — SODIUM CHLORIDE 0.9 % IV SOLN
Freq: Once | INTRAVENOUS | Status: AC
  Administered 2017-06-12: 14:00:00 via INTRAVENOUS

## 2017-06-12 MED ORDER — PROPRANOLOL HCL 20 MG PO TABS
20.0000 mg | ORAL_TABLET | Freq: Two times a day (BID) | ORAL | Status: DC
  Administered 2017-06-12 – 2017-06-15 (×6): 20 mg via ORAL
  Filled 2017-06-12 (×7): qty 1

## 2017-06-12 MED ORDER — MORPHINE SULFATE (PF) 4 MG/ML IV SOLN
4.0000 mg | Freq: Once | INTRAVENOUS | Status: AC
  Administered 2017-06-12: 4 mg via INTRAVENOUS
  Filled 2017-06-12: qty 1

## 2017-06-12 MED ORDER — ASPIRIN EC 81 MG PO TBEC
81.0000 mg | DELAYED_RELEASE_TABLET | Freq: Every day | ORAL | Status: DC
  Administered 2017-06-12 – 2017-06-15 (×4): 81 mg via ORAL
  Filled 2017-06-12 (×4): qty 1

## 2017-06-12 MED ORDER — SIMVASTATIN 20 MG PO TABS
10.0000 mg | ORAL_TABLET | Freq: Every day | ORAL | Status: DC
  Administered 2017-06-13 – 2017-06-14 (×2): 10 mg via ORAL
  Filled 2017-06-12 (×2): qty 1

## 2017-06-12 MED ORDER — MECLIZINE HCL 25 MG PO TABS
25.0000 mg | ORAL_TABLET | Freq: Three times a day (TID) | ORAL | Status: DC | PRN
  Filled 2017-06-12: qty 1

## 2017-06-12 MED ORDER — KETOROLAC TROMETHAMINE 30 MG/ML IJ SOLN
15.0000 mg | Freq: Once | INTRAMUSCULAR | Status: AC
  Administered 2017-06-12: 15 mg via INTRAVENOUS
  Filled 2017-06-12: qty 1

## 2017-06-12 MED ORDER — DIAZEPAM 2 MG PO TABS
2.0000 mg | ORAL_TABLET | Freq: Four times a day (QID) | ORAL | Status: DC | PRN
  Administered 2017-06-12: 2 mg via ORAL
  Filled 2017-06-12: qty 1

## 2017-06-12 MED ORDER — INSULIN ASPART 100 UNIT/ML ~~LOC~~ SOLN
0.0000 [IU] | Freq: Three times a day (TID) | SUBCUTANEOUS | Status: DC
  Administered 2017-06-13: 01:00:00 1 [IU] via SUBCUTANEOUS
  Administered 2017-06-13: 2 [IU] via SUBCUTANEOUS
  Administered 2017-06-13: 1 [IU] via SUBCUTANEOUS
  Administered 2017-06-14: 19:00:00 3 [IU] via SUBCUTANEOUS
  Administered 2017-06-14 – 2017-06-15 (×3): 2 [IU] via SUBCUTANEOUS
  Filled 2017-06-12 (×7): qty 1

## 2017-06-12 MED ORDER — LACTULOSE 10 GM/15ML PO SOLN: 20.0000 g | Freq: Every day | ORAL | Status: DC | PRN

## 2017-06-12 MED ORDER — LOSARTAN POTASSIUM 25 MG PO TABS
25.0000 mg | ORAL_TABLET | Freq: Every day | ORAL | Status: DC
  Administered 2017-06-13: 09:00:00 25 mg via ORAL
  Filled 2017-06-12: qty 1

## 2017-06-12 MED ORDER — ONDANSETRON 4 MG PO TBDP
4.0000 mg | ORAL_TABLET | Freq: Three times a day (TID) | ORAL | Status: DC | PRN
  Filled 2017-06-12: qty 1

## 2017-06-12 MED ORDER — HEPARIN SODIUM (PORCINE) 5000 UNIT/ML IJ SOLN
5000.0000 [IU] | Freq: Three times a day (TID) | INTRAMUSCULAR | Status: DC
  Administered 2017-06-13 – 2017-06-14 (×3): 5000 [IU] via SUBCUTANEOUS
  Filled 2017-06-12 (×3): qty 1

## 2017-06-12 NOTE — ED Triage Notes (Signed)
Pt fell couple saturdays ago, came in here on a Friday and found thoracic fracture. Pt went home.  Has fallen twice since last Friday, unsure why was unwitnessed.  AMS noted in triage with slurred speech and disorientation  time which is new per daughter.  Pain has not improved.

## 2017-06-12 NOTE — ED Provider Notes (Addendum)
[facility] Emergency Department Provider Note     Level V caveat: History/ROS limited by altered mental status  Time seen: ----------------------------------------- 1:10 PM on 06/12/2017 -----------------------------------------   I have reviewed the triage vital signs and the nursing notes.  HISTORY   Chief Complaint Back Pain    HPI Jaclyn Peterson is a 77 y.o. female with a history of chronic pain, depression, diabetes, hyperlipidemia, hypertension, pericarditis, pneumonia, small bowel obstruction, who presents to the ED for frequent falls.  Patient reportedly had 2 recent falls and was seen here for thoracic fracture on Friday.  She has reportedly not had a bowel movement in the last week, is confused according to the daughter and the pain is not improved.  She was complaining of some hip pain earlier.  No further information is available.  Past Medical History:  Diagnosis Date  . Celiac disease   . Chronic pain   . Depression   . Diabetes mellitus   . Falls frequently    Falls/pneumonia 1/12  . Hyperlipidemia   . Hypertension   . Migraines   . Neuropathy   . Osteoporosis   . Pericarditis   . Pneumonia   . SBO (small bowel obstruction) (HCC)    obstipation  10/2005  . Uterine cancer ([place])    P32 INSERT  . Vertigo     Patient Active Problem List   Diagnosis Date Noted  . Pedal edema 03/17/2017  . Left shoulder pain 12/15/2016  . Fall with injury 12/15/2016  . Benign paroxysmal positional vertigo 12/22/2015  . Advanced directives, counseling/discussion 11/01/2013  . Routine general medical examination at a health care facility 08/11/2011  . Type 2 diabetes mellitus with polyneuropathy ([place]) 09/02/2006  . HYPERLIPIDEMIA 08/29/2006  . Episodic mood disorder ([place]) 08/29/2006  . PAIN, CHRONIC NEC 08/29/2006  . Essential hypertension, benign 08/29/2006  . OSTEOPOROSIS 08/29/2006  . UTERINE CANCER, HX OF 08/29/2006    Past  Surgical History:  Procedure Laterality Date  . ABDOMINAL HYSTERECTOMY     BSO 1985  cancer  . APPENDECTOMY     1946  Adhesions shortly after  . BACK SURGERY  1970's  . BLADDER REPAIR     bladder tacking- chronic pain since  1998  . CATARACT EXTRACTION  12/12   left and then laser---Dr Tobe Sos  . CATARACT EXTRACTION W/PHACO Right 06/07/2016   Procedure: CATARACT EXTRACTION PHACO AND INTRAOCULAR LENS PLACEMENT (IOC);  Surgeon: Birder Robson, MD;  Location: [facility] ORS;  Service: Ophthalmology;  Laterality: Right;  [place] 00:43 AP% 20.6 CDE 8.96 Fluid pack lot # [id number]  . CHOLECYSTECTOMY     lysis of adhesions--post op complications, trach, etc  8/10  . KIDNEY STONE SURGERY     cysto-- Aline Brochure 2003    Allergies Aspirin; Bisoprolol fumarate; Chlorthalidone; Lisinopril; Sulfa antibiotics; and Tetracyclines & related  Social History Social History   Tobacco Use  . Smoking status: Current Some Day Smoker  . Smokeless tobacco: Never Used  . Tobacco comment: only once in a while. Gave info on 1-800-QUIT NOW  Substance Use Topics  . Alcohol use: No    Alcohol/week: 0.0 oz  . Drug use: No    Review of Systems Positive for back pain, constipation, falls, slurred speech and confusion  All systems negative/normal/unremarkable except as stated in the HPI  ____________________________________________   PHYSICAL EXAM:  VITAL SIGNS: ED Triage Vitals [06/12/17 1305]  Enc Vitals Group     BP (!) 162/69     Pulse  Rate 82     Resp (!) 24     Temp (!) 97.5 F (36.4 C)     Temp Source Oral     SpO2 95 %     Weight 162 lb (73.5 kg)     Height 5' 5"  (1.651 m)     Head Circumference      Peak Flow      Pain Score 10     Pain Loc      Pain Edu?      Excl. in [place]?    Constitutional: Alert but disoriented.  Mild distress Eyes: Conjunctivae are normal. Normal extraocular movements. ENT   Head: Normocephalic and atraumatic.   Nose: No congestion/rhinnorhea.    Mouth/Throat: Mucous membranes are dry   Neck: No stridor. Cardiovascular: Normal rate, regular rhythm. No murmurs, rubs, or gallops. Respiratory: Normal respiratory effort without tachypnea nor retractions. Breath sounds are clear and equal bilaterally. No wheezes/rales/rhonchi. Gastrointestinal: Soft and nontender. Normal bowel sounds Musculoskeletal: Limited but unremarkable range of motion of the lower extremities Neurologic:  Normal speech and language. No gross focal neurologic deficits are appreciated.  Skin:  Skin is warm, dry and intact. No rash noted. Psychiatric: Slightly slurred speech otherwise normal ____________________________________________  ED COURSE:  As part of my medical decision making, I reviewed the following data within the [facility] History obtained from family if available, nursing notes, old chart and ekg, as well as notes from prior ED visits. Patient presented for altered mental status with recent falls, we will assess with labs and imaging as indicated at this time.   Procedures ____________________________________________   LABS (pertinent positives/negatives)  Labs Reviewed  CBC WITH DIFFERENTIAL/PLATELET - Abnormal; Notable for the following components:      Result Value   WBC 17.0 (*)    Platelets 496 (*)    Neutro Abs 14.2 (*)    Monocytes Absolute 1.4 (*)    All other components within normal limits  COMPREHENSIVE METABOLIC PANEL - Abnormal; Notable for the following components:   CO2 13 (*)    Glucose, Bld 144 (*)    BUN 29 (*)    Creatinine, Ser 1.13 (*)    Albumin 3.2 (*)    Alkaline Phosphatase 138 (*)    GFR calc non Af Amer 45 (*)    GFR calc Af Amer 53 (*)    Anion gap 17 (*)    All other components within normal limits  TROPONIN I - Abnormal; Notable for the following components:   Troponin I 0.04 (*)    All other components within normal limits  URINALYSIS, COMPLETE (UACMP) WITH MICROSCOPIC  URINE DRUG  SCREEN, QUALITATIVE ([facility] ONLY)    RADIOLOGY  Thoracic spine x-rays, pelvis x-ray, CT head Did not reveal any acute process ____________________________________________  DIFFERENTIAL DIAGNOSIS   Fracture, dehydration, electrolyte abnormality, subdural hematoma, occult infection  FINAL ASSESSMENT AND PLAN  Fall, confusion, UTI   Plan: The patient had presented for persistent falls with new confusion. Patient's labs did reveal an increasing white count and slightly increased troponin of 0.04. Patient's imaging was negative for any acute process.  Her urine grew out E. coli from 2 days ago resistant to Cipro.  I have ordered IV Rocephin for her.  Given the fact that she is still confused I will discussed with the hospitalist for admission.   Laurence Aly, MD   Note: This note was generated in part or whole with voice recognition software. Voice recognition  is usually quite accurate but there are transcription errors that can and very often do occur. I apologize for any typographical errors that were not detected and corrected.     Earleen Newport, MD 06/12/17 1532    Earleen Newport, MD 06/28/17 [phone]

## 2017-06-12 NOTE — ED Notes (Signed)
Attempted to start IV x1 without success - attempted by [facility] RN x1 without success - lab called to come and draw labs

## 2017-06-12 NOTE — H&P (Signed)
[facility] [place] NAME: Jaclyn Peterson    MR#:  [id number]  DATE OF BIRTH:  28-Nov-1940  DATE OF ADMISSION:  06/12/2017  PRIMARY CARE PHYSICIAN: Venia Carbon, MD   REQUESTING/REFERRING PHYSICIAN: william  CHIEF COMPLAINT:   Chief Complaint  Patient presents with  . Back Pain    HISTORY OF PRESENT ILLNESS: Jaclyn Peterson  is a 77 y.o. female with a known history of diabetes, hyperlipidemia, hypertension, migraine, small bowel obstruction, uterine cancer- lives with her daughter,walks with minimal support, no memory issues at baseline. For last 1 week has worsening in functional status and had a fall last week. She came to emergency room, UA was negative and she was noted to have a compression fracture on vertebrae, sent home with pain medications.  She continued to have pain, decreased oral appetite, decreased movements, and more confused- concerned with this, daughter brought her to emergency room today. She was noted to have no clear reason, as per initial workup but because of significant decline in overall status in last 1 week, suggested to observe in hospital for further workup.  PAST MEDICAL HISTORY:   Past Medical History:  Diagnosis Date  . Celiac disease   . Chronic pain   . Depression   . Diabetes mellitus   . Falls frequently    Falls/pneumonia 1/12  . Hyperlipidemia   . Hypertension   . Migraines   . Neuropathy   . Osteoporosis   . Pericarditis   . Pneumonia   . SBO (small bowel obstruction) (HCC)    obstipation  10/2005  . Uterine cancer ([place])    P32 INSERT  . Vertigo     PAST SURGICAL HISTORY:  Past Surgical History:  Procedure Laterality Date  . ABDOMINAL HYSTERECTOMY     BSO 1985  cancer  . APPENDECTOMY     1946  Adhesions shortly after  . BACK SURGERY  1970's  . BLADDER REPAIR     bladder tacking- chronic pain since  1998  . CATARACT EXTRACTION  12/12   left and then laser---Dr Tobe Sos   . CATARACT EXTRACTION W/PHACO Right 06/07/2016   Procedure: CATARACT EXTRACTION PHACO AND INTRAOCULAR LENS PLACEMENT (IOC);  Surgeon: Birder Robson, MD;  Location: [facility] ORS;  Service: Ophthalmology;  Laterality: Right;  [place] 00:43 AP% 20.6 CDE 8.96 Fluid pack lot # [id number]  . CHOLECYSTECTOMY     lysis of adhesions--post op complications, trach, etc  8/10  . KIDNEY STONE SURGERY     cysto-- [place] 2003    SOCIAL HISTORY:  Social History   Tobacco Use  . Smoking status: Current Some Day Smoker  . Smokeless tobacco: Never Used  . Tobacco comment: only once in a while. Gave info on 1-800-QUIT NOW  Substance Use Topics  . Alcohol use: No    Alcohol/week: 0.0 oz    FAMILY HISTORY:  Family History  Problem Relation Age of Onset  . Coronary artery disease Father   . Hypertension Neg Hx   . Diabetes Neg Hx     DRUG ALLERGIES:  Allergies  Allergen Reactions  . Aspirin     Able to take baby aspirin  . Bisoprolol Fumarate     REACTION: ha  . Chlorthalidone   . Lisinopril     REACTION: throat symptoms  . Sulfa Antibiotics     Told by mother as a child  . Tetracyclines & Related Swelling    Throat swelling    REVIEW  OF SYSTEMS:   CONSTITUTIONAL: No fever, positive for fatigue or weakness.  EYES: No blurred or double vision.  EARS, NOSE, AND THROAT: No tinnitus or ear pain.  RESPIRATORY: No cough, shortness of breath, wheezing or hemoptysis.  CARDIOVASCULAR: No chest pain, orthopnea, edema.  GASTROINTESTINAL: No nausea, vomiting, diarrhea or abdominal pain.  GENITOURINARY: No dysuria, hematuria.  ENDOCRINE: No polyuria, nocturia,  HEMATOLOGY: No anemia, easy bruising or bleeding SKIN: No rash or lesion. MUSCULOSKELETAL: No joint pain or arthritis. Pain in the back. NEUROLOGIC: No tingling, numbness, weakness.  PSYCHIATRY: No anxiety or depression.   MEDICATIONS AT HOME:  Prior to Admission medications   Medication Sig Start Date End Date Taking? Authorizing  Provider  amLODipine (NORVASC) 10 MG tablet TAKE 1 TABLET(10 MG) BY MOUTH DAILY 04/24/17  Yes Venia Carbon, MD  aspirin 81 MG tablet Take 81 mg by mouth daily.     Yes [provider]  cholestyramine (QUESTRAN) 4 g packet MIX AND DRINK 1 PACKET BY MOUTH DAILY AS DIRECTED 03/20/17  Yes Viviana Simpler I, MD  DULoxetine (CYMBALTA) 60 MG capsule TAKE 1 CAPSULE BY MOUTH TWICE DAILY 05/11/17  Yes Viviana Simpler I, MD  FLUoxetine (PROZAC) 40 MG capsule TAKE 1 CAPSULE BY MOUTH EVERY DAY 12/29/16  Yes Viviana Simpler I, MD  glipiZIDE (GLUCOTROL) 5 MG tablet TAKE 1 TABLET BY MOUTH TWICE DAILY BEFORE A MEAL 01/24/17  Yes Viviana Simpler I, MD  JANUVIA 100 MG tablet TAKE 1 TABLET(100 MG) BY MOUTH DAILY 01/24/17  Yes Viviana Simpler I, MD  losartan (COZAAR) 25 MG tablet TAKE 1 TABLET(25 MG) BY MOUTH DAILY 04/24/17  Yes Viviana Simpler I, MD  metFORMIN (GLUCOPHAGE-XR) 500 MG 24 hr tablet TAKE 4 TABLETS BY MOUTH EVERY DAY WITH BREAKFAST 12/29/16  Yes Venia Carbon, MD  propranolol (INDERAL) 20 MG tablet Take 1 tablet (20 mg total) by mouth 2 (two) times daily. 03/15/17  Yes Venia Carbon, MD  simvastatin (ZOCOR) 20 MG tablet TAKE 1 TABLET(20 MG) BY MOUTH DAILY 05/25/17  Yes Venia Carbon, MD  ACCU-CHEK AVIVA PLUS test strip USE AS DIRECTED 03/08/17   Viviana Simpler I, MD  Blood Glucose Monitoring Suppl (ACCU-CHEK AVIVA PLUS) w/Device KIT TEST BLOOD SUGAR DAILY AS DIRECTED 03/08/17   Venia Carbon, MD  diazepam (VALIUM) 2 MG tablet TAKE 1 TABLET(2 MG) BY MOUTH TWICE DAILY AS NEEDED 06/09/17   Lisa Roca, MD  dicyclomine (BENTYL) 20 MG tablet Take 1 tablet (20 mg total) by mouth every 6 (six) hours as needed. 03/29/17   Paulette Blanch, MD  lactulose (CHRONULAC) 10 GM/15ML solution Take 30 mLs (20 g total) by mouth daily as needed for mild constipation. 03/29/17   Paulette Blanch, MD  Lancets (ACCU-CHEK SOFT TOUCH) lancets Use as instructed.  Dx code:  E11.40 02/17/16   Viviana Simpler I, MD   meclizine (ANTIVERT) 25 MG tablet TAKE 1 TABLET(25 MG) BY MOUTH THREE TIMES DAILY AS NEEDED FOR VERTIGO 03/23/17   Venia Carbon, MD  ondansetron (ZOFRAN ODT) 4 MG disintegrating tablet Take 1 tablet (4 mg total) by mouth every 8 (eight) hours as needed for nausea or vomiting. 03/29/17   Paulette Blanch, MD  tiZANidine (ZANAFLEX) 2 MG tablet TAKE 1 TABLET BY MOUTH TWICE DAILY AS NEEDED FOR MUSCLE SPASMS 03/06/17   Venia Carbon, MD  traMADol (ULTRAM) 50 MG tablet TAKE 1 TABLET(50 MG) BY MOUTH THREE TIMES DAILY AS NEEDED FOR PAIN 06/09/17   Lisa Roca, MD  PHYSICAL EXAMINATION:   VITAL SIGNS: Blood pressure (!) 192/66, pulse 77, temperature 98.3 F (36.8 C), temperature source Axillary, resp. rate 18, height 5' 5"  (1.651 m), weight 69.4 kg (153 lb), SpO2 100 %.  GENERAL:  77 y.o.-year-old patient lying in the bed with no acute distress.  EYES: Pupils equal, round, reactive to light and accommodation. No scleral icterus. Extraocular muscles intact.  HEENT: Head atraumatic, normocephalic. Oropharynx and nasopharynx clear.  NECK:  Supple, no jugular venous distention. No thyroid enlargement, no tenderness.  LUNGS: Normal breath sounds bilaterally, no wheezing, some crepitation. No use of accessory muscles of respiration.  CARDIOVASCULAR: S1, S2 normal. No murmurs, rubs, or gallops.  ABDOMEN: Soft, nontender, nondistended. Bowel sounds present. No organomegaly or mass.  EXTREMITIES: No pedal edema, cyanosis, or clubbing. Tender in mid to lower back. NEUROLOGIC: Cranial nerves II through XII are intact. Muscle strength 4/5 in all extremities. Sensation intact. Gait not checked.  PSYCHIATRIC: The patient is alert and oriented x 2.  SKIN: No obvious rash, lesion, or ulcer.   LABORATORY PANEL:   CBC Recent Labs  Lab 06/09/17 0751 06/12/17 1441  WBC 13.9* 17.0*  HGB 13.5 13.8  HCT 40.3 42.4  PLT 383 496*  MCV 91.4 93.6  MCH 30.7 30.5  MCHC 33.6 32.6  RDW 13.4 13.8  LYMPHSABS  1.1 1.3  MONOABS 1.1* 1.4*  EOSABS 0.2 0.1  BASOSABS 0.1 0.1   ------------------------------------------------------------------------------------------------------------------  Chemistries  Recent Labs  Lab 06/09/17 0751 06/12/17 1441  NA 137 137  K 3.1* 3.9  CL 102 107  CO2 20* 13*  GLUCOSE 177* 144*  BUN 12 29*  CREATININE 0.83 1.13*  CALCIUM 8.9 8.9  AST 29 23  ALT 27 29  ALKPHOS 157* 138*  BILITOT 1.0 1.0   ------------------------------------------------------------------------------------------------------------------ estimated creatinine clearance is 40.2 mL/min (A) (by C-G formula based on SCr of 1.13 mg/dL (H)). ------------------------------------------------------------------------------------------------------------------ No results for input(s): TSH, T4TOTAL, T3FREE, THYROIDAB in the last 72 hours.  Invalid input(s): FREET3   Coagulation profile No results for input(s): INR, PROTIME in the last 168 hours. ------------------------------------------------------------------------------------------------------------------- No results for input(s): DDIMER in the last 72 hours. -------------------------------------------------------------------------------------------------------------------  Cardiac Enzymes Recent Labs  Lab 06/09/17 0751 06/12/17 1441  TROPONINI 0.03* 0.04*   ------------------------------------------------------------------------------------------------------------------ Invalid input(s): POCBNP  ---------------------------------------------------------------------------------------------------------------  Urinalysis    Component Value Date/Time   COLORURINE YELLOW (A) 06/12/2017 1810   APPEARANCEUR CLOUDY (A) 06/12/2017 1810   LABSPEC 1.024 06/12/2017 1810   PHURINE 5.0 06/12/2017 1810   GLUCOSEU NEGATIVE 06/12/2017 1810   HGBUR NEGATIVE 06/12/2017 1810   HGBUR negative 05/03/2007 1016   BILIRUBINUR NEGATIVE 06/12/2017 1810    KETONESUR 20 (A) 06/12/2017 1810   PROTEINUR >=300 (A) 06/12/2017 1810   UROBILINOGEN 0.2 05/03/2007 1016   NITRITE NEGATIVE 06/12/2017 1810   LEUKOCYTESUR NEGATIVE 06/12/2017 1810     RADIOLOGY: Dg Thoracic Spine 2 View  Result Date: 06/12/2017 CLINICAL DATA:  Multiple recent falls. Altered mental status. Previous spinal fracture. EXAM: THORACIC SPINE 2 VIEWS COMPARISON:  Radiographs 06/09/2017 and 05/06/2014. CT lumbar spine 06/09/2017. FINDINGS: Twelve rib-bearing thoracic type vertebral bodies with small ribs at T12. Superior endplate compression fracture appears unchanged from the recent studies with 10-20% loss of vertebral body height. There is no widening of the interpedicular distance. Chronic Schmorl's node involving the superior endplate of L2. The alignment is normal. No new fractures are seen. IMPRESSION: Superior endplate compression fracture at T12 appears unchanged from recent studies. No new findings. Electronically Signed   By: Gwyndolyn Saxon  Lin Landsman M.D.   On: 06/12/2017 14:09   Dg Pelvis 1-2 Views  Result Date: 06/12/2017 CLINICAL DATA:  Recent falls.  Recent T12 fracture. EXAM: PELVIS - 1-2 VIEW COMPARISON:  Lumbar spine radiographs 06/09/2017. Abdominopelvic CT 03/28/2017. FINDINGS: The bones are mildly demineralized. No evidence of acute fracture or dislocation. There is transitional anatomy in the lower lumbar spine with postsurgical changes. Postsurgical changes are also present in the right iliac bone as correlated with prior CT. There are mild sacroiliac degenerative changes bilaterally. IMPRESSION: No acute osseous findings. Electronically Signed   By: Richardean Sale M.D.   On: 06/12/2017 14:11   Ct Head Wo Contrast  Result Date: 06/12/2017 CLINICAL DATA:  Several recent falls. Altered mental status with disorientation slurred speech EXAM: CT HEAD WITHOUT CONTRAST TECHNIQUE: Contiguous axial images were obtained from the base of the skull through the vertex without intravenous  contrast. COMPARISON:  June 09, 2017 FINDINGS: Brain: There is stable moderate diffuse atrophy. There is no intracranial mass, hemorrhage, extra-axial fluid collection, or midline shift. Small vessel disease throughout the centra semiovale is stable. There is no new gray-white compartment lesion. No acute infarct evident. Vascular: No hyperdense vessel. There is calcification in each carotid siphon and distal left vertebral artery regions. Skull: Bony calvarium appears intact. Sinuses/Orbits: Mild mucosal thickening in several ethmoid air cells is stable. Other visualized paranasal sinuses are clear. Visualized orbits appear symmetric bilaterally. Other: Visualized mastoid air cells are clear. IMPRESSION: Stable atrophy with supratentorial small vessel disease. No new gray-white compartment lesion. No mass or hemorrhage. Stable foci of arterial vascular calcification. Mucosal thickening in several ethmoid air cells. Electronically Signed   By: Lowella Grip III M.D.   On: 06/12/2017 13:44    EKG: Orders placed or performed during the hospital encounter of 06/12/17  . ED EKG  . ED EKG    IMPRESSION AND PLAN:  * confusion   Metabolic encehalopathy   Likely secondary to multiple medications.   Hold all pain medication and psychiatric medications.   History would like to rule out acute stroke or infections.   I had ordered in and out catheter and urinalysis and drug screen from that.   MRI on brain.   Chest x-ray.   Check ammonia and TSH.  * fall   Likely due to confusion, physical therapy evaluation.  * DM   Insulin sliding scale coverage.  * elevated WBCs   ER gave 1 dose of ceftriaxone, suspecting UTI.   Check for UA and chest x-ray, decide the need of antibiotics.  * acute renal failure   Metabolic acidos   IV fluids for now and follow bicarbonate tomorrow.  All the records are reviewed and case discussed with ED provider. Management plans discussed with the patient, family  and they are in agreement.  CODE STATUS: full.    Code Status Orders  (From admission, onward)        Start     Ordered   06/12/17 1834  Full code  Continuous     06/12/17 1833    Code Status History    This patient has a current code status but no historical code status.     Discussed with her daughter in  rom.  TOTAL TIME TAKING CARE OF THIS PATIENT: 50 minutes.    Vaughan Basta M.D on 06/12/2017   Between 7am to 6pm - Pager - [phone]  After 6pm go to [email or web address] - Proofreader  [employer]  [phone]  CC: Primary care physician; Venia Carbon, MD   Note: This dictation was prepared with Dragon dictation along with smaller phrase technology. Any transcriptional errors that result from this process are unintentional.

## 2017-06-12 NOTE — ED Notes (Signed)
PT to the floor via stretcher. Report called to [employer]. All questions answered. VSS. NAD

## 2017-06-12 NOTE — ED Notes (Signed)
Pt taken to CT.

## 2017-06-12 NOTE — Progress Notes (Signed)
Family Meeting Note  Advance Directive:no  Today a meeting took place with the Patient and daughter.  The following clinical team members were present during this meeting:MD  The following were discussed:Patient's diagnosis: Confusion, Elevated WBCs, fall. , Patient's progosis: Unable to determine and Goals for treatment: Full Code  Additional follow-up to be provided: Neurology consult.  Time spent during discussion:20 minutes  Vaughan Basta, MD

## 2017-06-12 NOTE — ED Notes (Signed)
Pt returned from scans.

## 2017-06-13 ENCOUNTER — Observation Stay: Payer: Medicare Other

## 2017-06-13 ENCOUNTER — Telehealth: Payer: Self-pay | Admitting: Internal Medicine

## 2017-06-13 DIAGNOSIS — R410 Disorientation, unspecified: Secondary | ICD-10-CM

## 2017-06-13 LAB — BASIC METABOLIC PANEL
Anion gap: 12 (ref 5–15)
BUN: 25 mg/dL — AB (ref 6–20)
CHLORIDE: 107 mmol/L (ref 101–111)
CO2: 17 mmol/L — AB (ref 22–32)
CREATININE: 0.86 mg/dL (ref 0.44–1.00)
Calcium: 8.7 mg/dL — ABNORMAL LOW (ref 8.9–10.3)
GFR calc Af Amer: >60 mL/min (ref >60)
GFR calc non Af Amer: >60 mL/min (ref >60)
GLUCOSE: 156 mg/dL — AB (ref 65–99)
Potassium: 3.4 mmol/L — ABNORMAL LOW (ref 3.5–5.1)
SODIUM: 136 mmol/L (ref 135–145)

## 2017-06-13 LAB — CBC
HEMATOCRIT: 41.7 % (ref 35.0–47.0)
Hemoglobin: 13.8 g/dL (ref 12.0–16.0)
MCH: 30.3 pg (ref 26.0–34.0)
MCHC: 33.2 g/dL (ref 32.0–36.0)
MCV: 91.5 fL (ref 80.0–100.0)
PLATELETS: 458 10*3/uL — AB (ref 150–440)
RBC: 4.56 MIL/uL (ref 3.80–5.20)
RDW: 14.1 % (ref 11.5–14.5)
WBC: 13.1 10*3/uL — AB (ref 3.6–11.0)

## 2017-06-13 LAB — AMMONIA: Ammonia: 17 umol/L (ref 9–35)

## 2017-06-13 LAB — TSH: TSH: 1.916 u[IU]/mL (ref 0.350–4.500)

## 2017-06-13 LAB — GLUCOSE, CAPILLARY
GLUCOSE-CAPILLARY: 143 mg/dL — AB (ref 65–99)
GLUCOSE-CAPILLARY: 186 mg/dL — AB (ref 65–99)
Glucose-Capillary: 166 mg/dL — ABNORMAL HIGH (ref 65–99)
Glucose-Capillary: 140 mg/dL — ABNORMAL HIGH (ref 65–99)

## 2017-06-13 MED ORDER — AMLODIPINE BESYLATE 10 MG PO TABS
10.0000 mg | ORAL_TABLET | Freq: Every day | ORAL | Status: DC
  Administered 2017-06-14 – 2017-06-15 (×2): 10 mg via ORAL
  Filled 2017-06-13 (×2): qty 1

## 2017-06-13 MED ORDER — LACTATED RINGERS IV BOLUS
1000.0000 mL | Freq: Once | INTRAVENOUS | Status: AC
  Administered 2017-06-13: 13:00:00 1000 mL via INTRAVENOUS

## 2017-06-13 MED ORDER — POTASSIUM CHLORIDE 20 MEQ PO PACK
40.0000 meq | PACK | Freq: Once | ORAL | Status: AC
  Administered 2017-06-13: 13:00:00 40 meq via ORAL
  Filled 2017-06-13: qty 2

## 2017-06-13 MED ORDER — HYDRALAZINE HCL 20 MG/ML IJ SOLN: 10.0000 mg | INTRAMUSCULAR | Status: DC | PRN

## 2017-06-13 MED ORDER — HYDRALAZINE HCL 20 MG/ML IJ SOLN: 20.0000 mg | INTRAMUSCULAR | Status: DC | PRN

## 2017-06-13 MED ORDER — GABAPENTIN 100 MG PO CAPS
100.0000 mg | ORAL_CAPSULE | Freq: Three times a day (TID) | ORAL | Status: DC
  Administered 2017-06-13 – 2017-06-15 (×8): 100 mg via ORAL
  Filled 2017-06-13 (×8): qty 1

## 2017-06-13 MED ORDER — HYDRALAZINE HCL 20 MG/ML IJ SOLN
10.0000 mg | INTRAMUSCULAR | Status: DC | PRN
  Administered 2017-06-13 – 2017-06-14 (×3): 10 mg via INTRAVENOUS
  Filled 2017-06-13 (×3): qty 1

## 2017-06-13 MED ORDER — ACETAMINOPHEN 500 MG PO TABS
500.0000 mg | ORAL_TABLET | Freq: Three times a day (TID) | ORAL | Status: DC
  Administered 2017-06-13 – 2017-06-15 (×8): 500 mg via ORAL
  Filled 2017-06-13 (×8): qty 1

## 2017-06-13 MED ORDER — MELOXICAM 7.5 MG PO TABS
15.0000 mg | ORAL_TABLET | Freq: Every day | ORAL | Status: DC
  Administered 2017-06-13 – 2017-06-15 (×3): 15 mg via ORAL
  Filled 2017-06-13 (×3): qty 2

## 2017-06-13 MED ORDER — SODIUM CHLORIDE 0.9 % IV SOLN
1.0000 g | INTRAVENOUS | Status: DC
  Administered 2017-06-13 – 2017-06-14 (×2): 1 g via INTRAVENOUS
  Filled 2017-06-13 (×3): qty 10

## 2017-06-13 NOTE — Consult Note (Signed)
Reason for Consult:Confusion Referring Physician: Anselm Jungling  CC: Confusion  HPI: Jaclyn Peterson is an 77 y.o. female brought to the ED by daughter for falls and confusion.  Patient incurred a vertebral compression fracture with her previous fall.  Has required quite a bit of pain medication per her.  No significant confusion noted in ED from review of notes but wbc count elevated.  .    Past Medical History:  Diagnosis Date  . Celiac disease   . Chronic pain   . Depression   . Diabetes mellitus   . Falls frequently    Falls/pneumonia 1/12  . Hyperlipidemia   . Hypertension   . Migraines   . Neuropathy   . Osteoporosis   . Pericarditis   . Pneumonia   . SBO (small bowel obstruction) (HCC)    obstipation  10/2005  . 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 Vertigo     Past Surgical History:  Procedure Laterality Date  . ABDOMINAL HYSTERECTOMY     BSO 1985  cancer  . APPENDECTOMY     1946  Adhesions shortly after  . BACK SURGERY  1970's  . BLADDER REPAIR     bladder tacking- chronic pain since  1998  . CATARACT EXTRACTION  12/12   left and then laser---Dr Tobe Sos  . CATARACT EXTRACTION W/PHACO Right 06/07/2016   Procedure: CATARACT EXTRACTION PHACO AND INTRAOCULAR LENS PLACEMENT (IOC);  Surgeon: Birder Robson, MD;  Location: [facility] ORS;  Service: Ophthalmology;  Laterality: Right;  [place] 00:43 AP% 20.6 CDE 8.96 Fluid pack lot # [id number]  . CHOLECYSTECTOMY     lysis of adhesions--post op complications, trach, etc  8/10  . KIDNEY STONE SURGERY     cysto-- [place] 2003    Family History  Problem Relation Age of Onset  . Coronary artery disease Father   . Hypertension Neg Hx   . Diabetes Neg Hx     Social History:  reports that she has been smoking.  She has never used smokeless tobacco. She reports that she does not drink alcohol or use drugs.  Allergies  Allergen Reactions  . Aspirin     Able to take baby aspirin  . Bisoprolol Fumarate     REACTION: ha  .  Chlorthalidone   . Lisinopril     REACTION: throat symptoms  . Sulfa Antibiotics     Told by mother as a child  . Tetracyclines & Related Swelling    Throat swelling    Medications:  I have reviewed the patient's current medications. Prior to Admission:  Medications Prior to Admission  Medication Sig Dispense Refill Last Dose  . amLODipine (NORVASC) 10 MG tablet TAKE 1 TABLET(10 MG) BY MOUTH DAILY 90 tablet 3 Past Week at Unknown time  . aspirin 81 MG tablet Take 81 mg by mouth daily.     Past Week at Unknown time  . cholestyramine (QUESTRAN) 4 g packet MIX AND DRINK 1 PACKET BY MOUTH DAILY AS DIRECTED 60 packet 5 Past Week at Unknown time  . DULoxetine (CYMBALTA) 60 MG capsule TAKE 1 CAPSULE BY MOUTH TWICE DAILY 180 capsule 3 Past Week at Unknown time  . FLUoxetine (PROZAC) 40 MG capsule TAKE 1 CAPSULE BY MOUTH EVERY DAY 90 capsule 3 Past Week at Unknown time  . glipiZIDE (GLUCOTROL) 5 MG tablet TAKE 1 TABLET BY MOUTH TWICE DAILY BEFORE A MEAL 180 tablet 3 Past Week at Unknown time  . JANUVIA 100 MG tablet TAKE 1  TABLET(100 MG) BY MOUTH DAILY 30 tablet 11 Past Week at Unknown time  . losartan (COZAAR) 25 MG tablet TAKE 1 TABLET(25 MG) BY MOUTH DAILY 90 tablet 3 Past Week at Unknown time  . metFORMIN (GLUCOPHAGE-XR) 500 MG 24 hr tablet TAKE 4 TABLETS BY MOUTH EVERY DAY WITH BREAKFAST 120 tablet 11 Past Week at Unknown time  . propranolol (INDERAL) 20 MG tablet Take 1 tablet (20 mg total) by mouth 2 (two) times daily. 180 tablet 1 Past Week at Unknown time  . simvastatin (ZOCOR) 20 MG tablet TAKE 1 TABLET(20 MG) BY MOUTH DAILY 90 tablet 0 Past Week at Unknown time  . ACCU-CHEK AVIVA PLUS test strip USE AS DIRECTED 100 each 2 UTD at UTD  . Blood Glucose Monitoring Suppl (ACCU-CHEK AVIVA PLUS) w/Device KIT TEST BLOOD SUGAR DAILY AS DIRECTED 1 kit 0 UTD at UTD  . diazepam (VALIUM) 2 MG tablet TAKE 1 TABLET(2 MG) BY MOUTH TWICE DAILY AS NEEDED 10 tablet 0 PRN at PRN  . dicyclomine (BENTYL) 20  MG tablet Take 1 tablet (20 mg total) by mouth every 6 (six) hours as needed. 20 tablet 0 PRN at PRN  . lactulose (CHRONULAC) 10 GM/15ML solution Take 30 mLs (20 g total) by mouth daily as needed for mild constipation. 120 mL 0 PRN at PRN  . Lancets (ACCU-CHEK SOFT TOUCH) lancets Use as instructed.  Dx code:  E11.40 100 each 12 UTD at UTD  . meclizine (ANTIVERT) 25 MG tablet TAKE 1 TABLET(25 MG) BY MOUTH THREE TIMES DAILY AS NEEDED FOR VERTIGO 90 tablet 0 PRN at PRN  . ondansetron (ZOFRAN ODT) 4 MG disintegrating tablet Take 1 tablet (4 mg total) by mouth every 8 (eight) hours as needed for nausea or vomiting. 20 tablet 0 PRN at PRN  . tiZANidine (ZANAFLEX) 2 MG tablet TAKE 1 TABLET BY MOUTH TWICE DAILY AS NEEDED FOR MUSCLE SPASMS 180 tablet 0 PRN at PRN  . traMADol (ULTRAM) 50 MG tablet TAKE 1 TABLET(50 MG) BY MOUTH THREE TIMES DAILY AS NEEDED FOR PAIN 15 tablet 0 prn at prn   Scheduled: . acetaminophen  500 mg Oral TID  . amLODipine  5 mg Oral Daily  . aspirin EC  81 mg Oral Daily  . gabapentin  100 mg Oral TID  . heparin  5,000 Units Subcutaneous Q8H  . insulin aspart  0-9 Units Subcutaneous TID WC  . losartan  25 mg Oral Daily  . meloxicam  15 mg Oral Daily  . potassium chloride  40 mEq Oral Once  . propranolol  20 mg Oral BID  . simvastatin  10 mg Oral q1800    ROS: History obtained from the patient  General ROS: negative for - chills, fatigue, fever, night sweats, weight gain or weight loss Psychological ROS: negative for - behavioral disorder, hallucinations, memory difficulties, mood swings or suicidal ideation Ophthalmic ROS: negative for - blurry vision, double vision, eye pain or loss of vision ENT ROS: negative for - epistaxis, nasal discharge, oral lesions, sore throat, tinnitus or vertigo Allergy and Immunology ROS: negative for - hives or itchy/watery eyes Hematological and Lymphatic ROS: negative for - bleeding problems, bruising or swollen lymph nodes Endocrine ROS:  negative for - galactorrhea, hair pattern changes, polydipsia/polyuria or temperature intolerance Respiratory ROS: negative for - cough, hemoptysis, shortness of breath or wheezing Cardiovascular ROS: negative for - chest pain, dyspnea on exertion, edema or irregular heartbeat Gastrointestinal ROS: negative for - abdominal pain, diarrhea, hematemesis, nausea/vomiting or stool incontinence  Genito-Urinary ROS: negative for - dysuria, hematuria, incontinence or urinary frequency/urgency Musculoskeletal ROS: back pain Neurological ROS: as noted in HPI Dermatological ROS: negative for rash and skin lesion changes  Physical Examination: Blood pressure (!) 198/64, pulse 72, temperature 98 F (36.7 C), temperature source Oral, resp. rate 18, height 5' 5"  (1.651 m), weight 69.4 kg (153 lb), SpO2 99 %.  HEENT-  Normocephalic, no lesions, without obvious abnormality.  Normal external eye and conjunctiva.  Normal TM's bilaterally.  Normal auditory canals and external ears. Normal external nose, mucus membranes and septum.  Normal pharynx. Cardiovascular- S1, S2 normal, pulses palpable throughout   Lungs- chest clear, no wheezing, rales, normal symmetric air entry Abdomen- soft, non-tender; bowel sounds normal; no masses,  no organomegaly Extremities- no edema Lymph-no adenopathy palpable Musculoskeletal-no joint tenderness, deformity or swelling Skin-extensive bruising  Neurological Examination   Mental Status: Alert, oriented, thought content appropriate.  Speech fluent without evidence of aphasia.  Able to follow 3 step commands without difficulty. Cranial Nerves: II: Discs flat bilaterally; Visual fields grossly normal, pupils equal, round, reactive to light and accommodation III,IV, VI: ptosis not present, extra-ocular motions intact bilaterally V,VII: smile symmetric, facial light touch sensation normal bilaterally VIII: hearing normal bilaterally IX,X: gag reflex present XI: bilateral  shoulder shrug XII: midline tongue extension Motor: Moves all extremities against gravity with no focal weakness noted.  Moves lower extremities less due to pain Sensory: Pinprick and light touch intact throughout, bilaterally Deep Tendon Reflexes: 2+ and symmetric with absent AJ's bilaterally Plantars: Right: upgoing   Left: upgoing Cerebellar: Normal finger-to-nose testing bilaterally Gait: not tested due to safety concerns   Laboratory Studies:   Basic Metabolic Panel: Recent Labs  Lab 06/09/17 0751 06/12/17 1441 06/13/17 0518  NA 137 137 136  K 3.1* 3.9 3.4*  CL 102 107 107  CO2 20* 13* 17*  GLUCOSE 177* 144* 156*  BUN 12 29* 25*  CREATININE 0.83 1.13* 0.86  CALCIUM 8.9 8.9 8.7*    Liver Function Tests: Recent Labs  Lab 06/09/17 0751 06/12/17 1441  AST 29 23  ALT 27 29  ALKPHOS 157* 138*  BILITOT 1.0 1.0  PROT 7.7 6.8  ALBUMIN 3.4* 3.2*   No results for input(s): LIPASE, AMYLASE in the last 168 hours. Recent Labs  Lab 06/13/17 0518  AMMONIA 17    CBC: Recent Labs  Lab 06/09/17 0751 06/12/17 1441 06/13/17 0518  WBC 13.9* 17.0* 13.1*  NEUTROABS 11.3* 14.2*  --   HGB 13.5 13.8 13.8  HCT 40.3 42.4 41.7  MCV 91.4 93.6 91.5  PLT 383 496* 458*    Cardiac Enzymes: Recent Labs  Lab 06/09/17 0751 06/12/17 1441  TROPONINI 0.03* 0.04*    BNP: Invalid input(s): POCBNP  CBG: Recent Labs  Lab 06/12/17 2006 06/13/17 0726 06/13/17 1142  GLUCAP 163* 166* 140*    Microbiology: Results for orders placed or performed during the hospital encounter of 03/28/17  Urine Culture     Status: Abnormal   Collection Time: 03/28/17  7:09 AM  Result Value Ref Range Status   Specimen Description   Final    URINE, RANDOM Performed at [facility], [address] [place], [place] [address]    Special Requests   Final    NONE Performed at [facility], [address] [place], [place] [address]    Culture >=100,000 COLONIES/mL  ESCHERICHIA COLI (A)  Final   Report Status 03/30/2017 FINAL  Final   Organism ID, Bacteria ESCHERICHIA COLI (  A)  Final      Susceptibility   Escherichia coli - MIC*    AMPICILLIN 4 SENSITIVE Sensitive     CEFAZOLIN <=4 SENSITIVE Sensitive     CEFTRIAXONE <=1 SENSITIVE Sensitive     CIPROFLOXACIN >=4 RESISTANT Resistant     GENTAMICIN <=1 SENSITIVE Sensitive     IMIPENEM <=0.25 SENSITIVE Sensitive     NITROFURANTOIN <=16 SENSITIVE Sensitive     TRIMETH/SULFA <=20 SENSITIVE Sensitive     AMPICILLIN/SULBACTAM <=2 SENSITIVE Sensitive     PIP/TAZO <=4 SENSITIVE Sensitive     Extended ESBL NEGATIVE Sensitive     * >=100,000 COLONIES/mL ESCHERICHIA COLI    Coagulation Studies: No results for input(s): LABPROT, INR in the last 72 hours.  Urinalysis:  Recent Labs  Lab 06/09/17 0751 06/12/17 1810  COLORURINE AMBER* YELLOW*  LABSPEC 1.015 1.024  PHURINE 5.0 5.0  GLUCOSEU NEGATIVE NEGATIVE  HGBUR SMALL* NEGATIVE  BILIRUBINUR NEGATIVE NEGATIVE  KETONESUR 20* 20*  PROTEINUR >=300* >=300*  NITRITE NEGATIVE NEGATIVE  LEUKOCYTESUR NEGATIVE NEGATIVE    Lipid Panel:     Component Value Date/Time   CHOL 180 05/06/2016 1327   TRIG 376.0 (H) 05/06/2016 1327   HDL 35.50 (L) 05/06/2016 1327   CHOLHDL 5 05/06/2016 1327   VLDL 75.2 (H) 05/06/2016 1327    HgbA1C:  Lab Results  Component Value Date   HGBA1C 7.3 (H) 12/15/2016    Urine Drug Screen:      Component Value Date/Time   LABOPIA POSITIVE (A) 06/12/2017 1810   COCAINSCRNUR NONE DETECTED 06/12/2017 1810   LABBENZ POSITIVE (A) 06/12/2017 1810   AMPHETMU NONE DETECTED 06/12/2017 1810   THCU NONE DETECTED 06/12/2017 1810   LABBARB NONE DETECTED 06/12/2017 1810    Alcohol Level: No results for input(s): ETH in the last 168 hours.  Other results: EKG: sinus rhythm at 76 bpm.  Imaging: Dg Chest 2 View  Result Date: 06/13/2017 CLINICAL DATA:  Confusion. History of hypertension and diabetes, current smoker. EXAM: CHEST - 2  VIEW COMPARISON:  PA and lateral chest x-ray of March 06, 2015 and thoracic spine series of June 12, 2017. FINDINGS: The lungs are well-expanded. There is no focal infiltrate. The interstitial markings are coarse though stable. The heart and pulmonary vascularity are normal. There is calcification in the wall of the aortic arch. There has been further compression of the body of T12 with loss of height anteriorly now of approximately 30-40% and posteriorly 15%. IMPRESSION: Chronic bronchitic changes, stable. No pneumonia, CHF, nor other acute cardiopulmonary abnormality. Slight interval worsening in the degree of compression of T12 since yesterday's study. Electronically Signed   By: David  Martinique M.D.   On: 06/13/2017 08:15   Dg Thoracic Spine 2 View  Result Date: 06/12/2017 CLINICAL DATA:  Multiple recent falls. Altered mental status. Previous spinal fracture. EXAM: THORACIC SPINE 2 VIEWS COMPARISON:  Radiographs 06/09/2017 and 05/06/2014. CT lumbar spine 06/09/2017. FINDINGS: Twelve rib-bearing thoracic type vertebral bodies with small ribs at T12. Superior endplate compression fracture appears unchanged from the recent studies with 10-20% loss of vertebral body height. There is no widening of the interpedicular distance. Chronic Schmorl's node involving the superior endplate of L2. The alignment is normal. No new fractures are seen. IMPRESSION: Superior endplate compression fracture at T12 appears unchanged from recent studies. No new findings. Electronically Signed   By: Richardean Sale M.D.   On: 06/12/2017 14:09   Dg Pelvis 1-2 Views  Result Date: 06/12/2017 CLINICAL DATA:  Recent falls.  Recent T12 fracture. EXAM: PELVIS - 1-2 VIEW COMPARISON:  Lumbar spine radiographs 06/09/2017. Abdominopelvic CT 03/28/2017. FINDINGS: The bones are mildly demineralized. No evidence of acute fracture or dislocation. There is transitional anatomy in the lower lumbar spine with postsurgical changes. Postsurgical  changes are also present in the right iliac bone as correlated with prior CT. There are mild sacroiliac degenerative changes bilaterally. IMPRESSION: No acute osseous findings. Electronically Signed   By: Richardean Sale M.D.   On: 06/12/2017 14:11   Ct Head Wo Contrast  Result Date: 06/12/2017 CLINICAL DATA:  Several recent falls. Altered mental status with disorientation slurred speech EXAM: CT HEAD WITHOUT CONTRAST TECHNIQUE: Contiguous axial images were obtained from the base of the skull through the vertex without intravenous contrast. COMPARISON:  June 09, 2017 FINDINGS: Brain: There is stable moderate diffuse atrophy. There is no intracranial mass, hemorrhage, extra-axial fluid collection, or midline shift. Small vessel disease throughout the centra semiovale is stable. There is no new gray-white compartment lesion. No acute infarct evident. Vascular: No hyperdense vessel. There is calcification in each carotid siphon and distal left vertebral artery regions. Skull: Bony calvarium appears intact. Sinuses/Orbits: Mild mucosal thickening in several ethmoid air cells is stable. Other visualized paranasal sinuses are clear. Visualized orbits appear symmetric bilaterally. Other: Visualized mastoid air cells are clear. IMPRESSION: Stable atrophy with supratentorial small vessel disease. No new gray-white compartment lesion. No mass or hemorrhage. Stable foci of arterial vascular calcification. Mucosal thickening in several ethmoid air cells. Electronically Signed   By: Lowella Grip III M.D.   On: 06/12/2017 13:44   Mr Brain Wo Contrast  Result Date: 06/13/2017 CLINICAL DATA:  Worsening mental altered mental status, worsening after fall last week. History of migraines, hypertension, hyperlipidemia. EXAM: MRI HEAD WITHOUT CONTRAST TECHNIQUE: Multiplanar, multiecho pulse sequences of the brain and surrounding structures were obtained without intravenous contrast. COMPARISON:  CT HEAD June 12, 2016  FINDINGS: Moderately motion degraded examination. INTRACRANIAL CONTENTS: No reduced diffusion to suggest acute ischemia, typical infection or hypercellular tumor. No susceptibility artifact to suggest hemorrhage. Moderate to severe parenchymal brain volume loss. Confluent supratentorial white matter FLAIR T2 hyperintensities. No midline shift, mass effect or masses though limited by patient motion. No abnormal extra-axial fluid collections. No extra-axial masses. VASCULAR: Normal major intracranial vascular flow voids present at skull base. SKULL AND UPPER CERVICAL SPINE: No abnormal sellar expansion. No suspicious calvarial bone marrow signal. Craniocervical junction maintained. SINUSES/ORBITS: The mastoid air-cells and included paranasal sinuses are well-aerated.The included ocular globes and orbital contents are non-suspicious. Status post bilateral ocular lens implants. OTHER: Patient is edentulous. IMPRESSION: 1. No acute intracranial process on this motion degraded examination. 2. Moderate to severe parenchymal brain volume loss and moderate to severe chronic small vessel ischemic disease. Electronically Signed   By: Elon Alas M.D.   On: 06/13/2017 00:25     Assessment/Plan: 77 year old female presenting with confusion.  Recent compression fracture.  UDS positive for tricyclics, benzos and opioids.  Medications addressed.  Patient appears to be a what is likely baseline today but daughter not present at this time.  MRI of the brain reviewed and shows no acute changes.  Mental status changes likely secondary to medcations and pain.  This is likely affecting her stability as well.    No further neurologic intervention is recommended at this time.  If further questions arise, please call or page at that time.  Thank you for allowing neurology to participate in the care of this patient.  Alexis Goodell, MD Neurology [phone] 06/13/2017, 11:45 AM

## 2017-06-13 NOTE — Telephone Encounter (Signed)
Jaclyn Peterson called back; his sister is a Pharmacist, hospital and will not be able to talk on phone so Jaclyn Peterson called back.Jaclyn Peterson said Pt is in hospital with back issue and possible over usage of pain med. Pt had fallen had compression fx of back; pt went to ED 06/09/17. Pt came home not a lot of eating or fluid intake and was admitted to [facility] on 06/12/2017;Jaclyn Peterson thinks pt will be sent for rehab when leaves hospital. Pt has hx of issues with taking pain med. Just wanted Dr Silvio Pate to be aware of what is going on and did not know if Dr Silvio Pate could let admitting physician know about pts hx of having issues taking pain meds; per chart review of admission on 06/12/17 pt having confusion as well. FYI to Dr Silvio Pate.

## 2017-06-13 NOTE — Progress Notes (Signed)
Social work Theatre manager presented bed offers to patient's son Jaclyn Peterson. Patient's son chose [facility]. Magda Paganini, admission coordinator at [facility], is aware of accepted bed offer and will start [facility] SNF authorization.  Susy Frizzle, Social Work Intern [phone]

## 2017-06-13 NOTE — NC FL2 (Signed)
[place] LEVEL OF CARE SCREENING TOOL     IDENTIFICATION  Patient Name: Jaclyn Peterson Birthdate: [birth year] Sex: female Admission Date (Current Location): 06/12/2017  [place] and [place] Number:  Engineering geologist and Address:  [facility], [address] [place], [place] [address]      Provider Number: [id number]  Attending Physician Name and Address:  Gorden Harms, MD  Relative Name and Phone Number:       Current Level of Care: Hospital Recommended Level of Care: [place] Prior Approval Number:    Date Approved/Denied:   PASRR Number: [id number])  Discharge Plan: SNF    Current Diagnoses: Patient Active Problem List   Diagnosis Date Noted  . Confusion 06/12/2017  . Altered mental status 06/12/2017  . Pedal edema 03/17/2017  . Left shoulder pain 12/15/2016  . Fall with injury 12/15/2016  . Benign paroxysmal positional vertigo 12/22/2015  . Advanced directives, counseling/discussion 11/01/2013  . Routine general medical examination at a health care facility 08/11/2011  . Type 2 diabetes mellitus with polyneuropathy ([place]) 09/02/2006  . HYPERLIPIDEMIA 08/29/2006  . Episodic mood disorder ([place]) 08/29/2006  . PAIN, CHRONIC NEC 08/29/2006  . Essential hypertension, benign 08/29/2006  . OSTEOPOROSIS 08/29/2006  . UTERINE CANCER, HX OF 08/29/2006    Orientation RESPIRATION BLADDER Height & Weight     Self, Place, Situation  Normal Incontinent Weight: 153 lb (69.4 kg) Height:  5' 5"  (165.1 cm)  BEHAVIORAL SYMPTOMS/MOOD NEUROLOGICAL BOWEL NUTRITION STATUS      Continent Diet(Regular)  AMBULATORY STATUS COMMUNICATION OF NEEDS Skin   Extensive Assist Verbally Normal                       Personal Care Assistance Level of Assistance  Bathing, Feeding, Dressing Bathing Assistance: Limited assistance Feeding assistance: Independent Dressing Assistance: Limited assistance      Functional Limitations Info  Sight, Hearing, Speech Sight Info: Adequate Hearing Info: Adequate Speech Info: Adequate    SPECIAL CARE FACTORS FREQUENCY  PT (By licensed PT), OT (By licensed OT)     PT Frequency: (5) OT Frequency: (5)            Contractures      Additional Factors Info  Code Status, Allergies Code Status Info: (Full Code) Allergies Info: (ASPIRIN, BISOPROLOL FUMARATE, CHLORTHALIDONE, LISINOPRIL, SULFA ANTIBIOTICS, TETRACYCLINES & RELATED )           Current Medications (06/13/2017):  This is the current hospital active medication list Current Facility-Administered Medications  Medication Dose Route Frequency Provider Last Rate Last Dose  . 0.9 %  sodium chloride infusion   Intravenous Continuous Vaughan Basta, MD 75 mL/hr at 06/13/17 0056    . amLODipine (NORVASC) tablet 5 mg  5 mg Oral Daily Vaughan Basta, MD   5 mg at 06/13/17 0848  . aspirin EC tablet 81 mg  81 mg Oral Daily Vaughan Basta, MD   81 mg at 06/13/17 0848  . cefTRIAXone (ROCEPHIN) 1 g in sodium chloride 0.9 % 100 mL IVPB  1 g Intravenous Q24H Salary, Montell D, MD      . diazepam (VALIUM) tablet 2 mg  2 mg Oral Q6H PRN Vaughan Basta, MD   2 mg at 06/12/17 2150  . docusate sodium (COLACE) capsule 100 mg  100 mg Oral BID PRN Vaughan Basta, MD      . heparin injection 5,000 Units  5,000 Units Subcutaneous Q8H Vaughan Basta, MD      .  insulin aspart (novoLOG) injection 0-9 Units  0-9 Units Subcutaneous TID WC Vaughan Basta, MD   2 Units at 06/13/17 0848  . lactulose (CHRONULAC) 10 GM/15ML solution 20 g  20 g Oral Daily PRN Vaughan Basta, MD      . losartan (COZAAR) tablet 25 mg  25 mg Oral Daily Vaughan Basta, MD   25 mg at 06/13/17 0848  . meclizine (ANTIVERT) tablet 25 mg  25 mg Oral TID PRN Vaughan Basta, MD      . ondansetron (ZOFRAN-ODT) disintegrating tablet 4 mg  4 mg Oral Q8H PRN Vaughan Basta, MD      . propranolol (INDERAL) tablet 20 mg  20 mg Oral BID Vaughan Basta, MD   20 mg at 06/13/17 0848  . simvastatin (ZOCOR) tablet 10 mg  10 mg Oral q1800 Vaughan Basta, MD         Discharge Medications: Please see discharge summary for a list of discharge medications.  Relevant Imaging Results:  Relevant Lab Results:   Additional Information (SSN: [id number])  Smith Mince, Student-Social Work

## 2017-06-13 NOTE — Clinical Social Work Placement (Signed)
   CLINICAL SOCIAL WORK PLACEMENT  NOTE  Date:  06/13/2017  Patient Details  Name: Jaclyn Peterson MRN: [id number] Date of Birth: [birth year]  Clinical Social Work is seeking post-discharge placement for this patient at the [place] level of care (*CSW will initial, date and re-position this form in  chart as items are completed):  Yes   Patient/family provided with [facility] Work Department's list of facilities offering this level of care within the geographic area requested by the patient (or if unable, by the patient's family).  Yes   Patient/family informed of their freedom to choose among providers that offer the needed level of care, that participate in Medicare, Medicaid or managed care program needed by the patient, have an available bed and are willing to accept the patient.  Yes   Patient/family informed of [facility]'s ownership interest in [facility] and [facility], as well as of the fact that they are under no obligation to receive care at these facilities.  PASRR submitted to EDS on       PASRR number received on       Existing PASRR number confirmed on 06/13/17     FL2 transmitted to all facilities in geographic area requested by pt/family on 06/13/17     FL2 transmitted to all facilities within larger geographic area on       Patient informed that his/her managed care company has contracts with or will negotiate with certain facilities, including the following:            Patient/family informed of bed offers received.  Patient chooses bed at       Physician recommends and patient chooses bed at      Patient to be transferred to   on  .  Patient to be transferred to facility by       Patient family notified on   of transfer.  Name of family member notified:        PHYSICIAN       Additional Comment:    _______________________________________________ Gurley Climer, Veronia Beets, LCSW 06/13/2017, 12:05 PM

## 2017-06-13 NOTE — Progress Notes (Addendum)
Paged hospitalist r/t patient c/o of inability to urinate. Bladder scanned patient and showed 1175m. Awaiting call back/orders.   @2005  MD called; see new order for urinary catheter insertion r/t acute urinary retention

## 2017-06-13 NOTE — Plan of Care (Signed)
Have been chasing pt's elevated BP all day.  Pt had LR fluid bolus and has had NS running at 75 ml/hr.  Per Dr. [place] have given hydralazine 10 mg 2x. Called prime doc and he said to stop IV fluids.  Will recheck in 30 min.

## 2017-06-13 NOTE — Care Management Note (Addendum)
Case Management Note  Patient Details  Name: Jaclyn Peterson MRN: [id number] Date of Birth: May 19, 1940  Subjective/Objective:     Admitted to [facility] under observation status with the diagnosis of confusion.    Daughter, [name], [phone]) lives in the home. Last seen Dr.Letvak  12/15/16. Seen Dr. Danise Mina 03/17/17. Prescriptions are filled at [facility] in [place].    No home health. No skilled nursing. No home oxygen. States she has a treadmill in the home. No other medical equipment. Takes care of all basic activities of daily living herself, drives. States she falls all the time. Good appetite.  Family will transport.       Action/Plan: Will continue to follow for discharge needs.   Expected Discharge Date:                  Expected Discharge Plan:     In-House Referral:     Discharge planning Services     Post Acute Care Choice:    Choice offered to:     DME Arranged:    DME Agency:     HH Arranged:    HH Agency:     Status of Service:     If discussed at [employer], dates discussed:    Additional Comments:  Shelbie Ammons, RN MSN CCM Care Management [phone] 06/13/2017, 9:43 AM

## 2017-06-13 NOTE — Telephone Encounter (Signed)
I did not realize she was back in the hospital. I had called today to see how she was doing from the ER visit.

## 2017-06-13 NOTE — Progress Notes (Signed)
[facility] [place] NAME: Jaclyn Peterson    MR#:  [id number]  DATE OF BIRTH:  09/04/1940  SUBJECTIVE:  CHIEF COMPLAINT:   Chief Complaint  Patient presents with  . Back Pain  Complains of back pain, worsening T12 compression fracture noted on chest x-ray  REVIEW OF SYSTEMS:  CONSTITUTIONAL: No fever, fatigue or weakness.  EYES: No blurred or double vision.  EARS, NOSE, AND THROAT: No tinnitus or ear pain.  RESPIRATORY: No cough, shortness of breath, wheezing or hemoptysis.  CARDIOVASCULAR: No chest pain, orthopnea, edema.  GASTROINTESTINAL: No nausea, vomiting, diarrhea or abdominal pain.  GENITOURINARY: No dysuria, hematuria.  ENDOCRINE: No polyuria, nocturia,  HEMATOLOGY: No anemia, easy bruising or bleeding SKIN: No rash or lesion. MUSCULOSKELETAL: No joint pain or arthritis.   NEUROLOGIC: No tingling, numbness, weakness.  PSYCHIATRY: No anxiety or depression.   ROS  DRUG ALLERGIES:   Allergies  Allergen Reactions  . Aspirin     Able to take baby aspirin  . Bisoprolol Fumarate     REACTION: ha  . Chlorthalidone   . Lisinopril     REACTION: throat symptoms  . Sulfa Antibiotics     Told by mother as a child  . Tetracyclines & Related Swelling    Throat swelling    VITALS:  Blood pressure (!) 198/64, pulse 72, temperature 98 F (36.7 C), temperature source Oral, resp. rate 18, height 5' 5"  (1.651 m), weight 69.4 kg (153 lb), SpO2 99 %.  PHYSICAL EXAMINATION:  GENERAL:  77 y.o.-year-old patient lying in the bed with no acute distress.  EYES: Pupils equal, round, reactive to light and accommodation. No scleral icterus. Extraocular muscles intact.  HEENT: Head atraumatic, normocephalic. Oropharynx and nasopharynx clear.  NECK:  Supple, no jugular venous distention. No thyroid enlargement, no tenderness.  LUNGS: Normal breath sounds bilaterally, no wheezing, rales,rhonchi or crepitation. No use of accessory muscles  of respiration.  CARDIOVASCULAR: S1, S2 normal. No murmurs, rubs, or gallops.  ABDOMEN: Soft, nontender, nondistended. Bowel sounds present. No organomegaly or mass.  EXTREMITIES: No pedal edema, cyanosis, or clubbing.  NEUROLOGIC: Cranial nerves II through XII are intact. Muscle strength 5/5 in all extremities. Sensation intact. Gait not checked.  PSYCHIATRIC: The patient is alert and oriented x 3.  SKIN: No obvious rash, lesion, or ulcer.   Physical Exam LABORATORY PANEL:   CBC Recent Labs  Lab 06/13/17 0518  WBC 13.1*  HGB 13.8  HCT 41.7  PLT 458*   ------------------------------------------------------------------------------------------------------------------  Chemistries  Recent Labs  Lab 06/12/17 1441 06/13/17 0518  NA 137 136  K 3.9 3.4*  CL 107 107  CO2 13* 17*  GLUCOSE 144* 156*  BUN 29* 25*  CREATININE 1.13* 0.86  CALCIUM 8.9 8.7*  AST 23  --   ALT 29  --   ALKPHOS 138*  --   BILITOT 1.0  --    ------------------------------------------------------------------------------------------------------------------  Cardiac Enzymes Recent Labs  Lab 06/09/17 0751 06/12/17 1441  TROPONINI 0.03* 0.04*   ------------------------------------------------------------------------------------------------------------------  RADIOLOGY:  Dg Chest 2 View  Result Date: 06/13/2017 CLINICAL DATA:  Confusion. History of hypertension and diabetes, current smoker. EXAM: CHEST - 2 VIEW COMPARISON:  PA and lateral chest x-ray of March 06, 2015 and thoracic spine series of June 12, 2017. FINDINGS: The lungs are well-expanded. There is no focal infiltrate. The interstitial markings are coarse though stable. The heart and pulmonary vascularity are normal. There is calcification in the wall of the aortic arch.  There has been further compression of the body of T12 with loss of height anteriorly now of approximately 30-40% and posteriorly 15%. IMPRESSION: Chronic bronchitic changes,  stable. No pneumonia, CHF, nor other acute cardiopulmonary abnormality. Slight interval worsening in the degree of compression of T12 since yesterday's study. Electronically Signed   By: David  Martinique M.D.   On: 06/13/2017 08:15   Dg Thoracic Spine 2 View  Result Date: 06/12/2017 CLINICAL DATA:  Multiple recent falls. Altered mental status. Previous spinal fracture. EXAM: THORACIC SPINE 2 VIEWS COMPARISON:  Radiographs 06/09/2017 and 05/06/2014. CT lumbar spine 06/09/2017. FINDINGS: Twelve rib-bearing thoracic type vertebral bodies with small ribs at T12. Superior endplate compression fracture appears unchanged from the recent studies with 10-20% loss of vertebral body height. There is no widening of the interpedicular distance. Chronic Schmorl's node involving the superior endplate of L2. The alignment is normal. No new fractures are seen. IMPRESSION: Superior endplate compression fracture at T12 appears unchanged from recent studies. No new findings. Electronically Signed   By: Richardean Sale M.D.   On: 06/12/2017 14:09   Dg Pelvis 1-2 Views  Result Date: 06/12/2017 CLINICAL DATA:  Recent falls.  Recent T12 fracture. EXAM: PELVIS - 1-2 VIEW COMPARISON:  Lumbar spine radiographs 06/09/2017. Abdominopelvic CT 03/28/2017. FINDINGS: The bones are mildly demineralized. No evidence of acute fracture or dislocation. There is transitional anatomy in the lower lumbar spine with postsurgical changes. Postsurgical changes are also present in the right iliac bone as correlated with prior CT. There are mild sacroiliac degenerative changes bilaterally. IMPRESSION: No acute osseous findings. Electronically Signed   By: Richardean Sale M.D.   On: 06/12/2017 14:11   Ct Head Wo Contrast  Result Date: 06/12/2017 CLINICAL DATA:  Several recent falls. Altered mental status with disorientation slurred speech EXAM: CT HEAD WITHOUT CONTRAST TECHNIQUE: Contiguous axial images were obtained from the base of the skull through the  vertex without intravenous contrast. COMPARISON:  June 09, 2017 FINDINGS: Brain: There is stable moderate diffuse atrophy. There is no intracranial mass, hemorrhage, extra-axial fluid collection, or midline shift. Small vessel disease throughout the centra semiovale is stable. There is no new gray-white compartment lesion. No acute infarct evident. Vascular: No hyperdense vessel. There is calcification in each carotid siphon and distal left vertebral artery regions. Skull: Bony calvarium appears intact. Sinuses/Orbits: Mild mucosal thickening in several ethmoid air cells is stable. Other visualized paranasal sinuses are clear. Visualized orbits appear symmetric bilaterally. Other: Visualized mastoid air cells are clear. IMPRESSION: Stable atrophy with supratentorial small vessel disease. No new gray-white compartment lesion. No mass or hemorrhage. Stable foci of arterial vascular calcification. Mucosal thickening in several ethmoid air cells. Electronically Signed   By: Lowella Grip III M.D.   On: 06/12/2017 13:44   Mr Brain Wo Contrast  Result Date: 06/13/2017 CLINICAL DATA:  Worsening mental altered mental status, worsening after fall last week. History of migraines, hypertension, hyperlipidemia. EXAM: MRI HEAD WITHOUT CONTRAST TECHNIQUE: Multiplanar, multiecho pulse sequences of the brain and surrounding structures were obtained without intravenous contrast. COMPARISON:  CT HEAD June 12, 2016 FINDINGS: Moderately motion degraded examination. INTRACRANIAL CONTENTS: No reduced diffusion to suggest acute ischemia, typical infection or hypercellular tumor. No susceptibility artifact to suggest hemorrhage. Moderate to severe parenchymal brain volume loss. Confluent supratentorial white matter FLAIR T2 hyperintensities. No midline shift, mass effect or masses though limited by patient motion. No abnormal extra-axial fluid collections. No extra-axial masses. VASCULAR: Normal major intracranial vascular flow  voids present at skull base. SKULL  AND UPPER CERVICAL SPINE: No abnormal sellar expansion. No suspicious calvarial bone marrow signal. Craniocervical junction maintained. SINUSES/ORBITS: The mastoid air-cells and included paranasal sinuses are well-aerated.The included ocular globes and orbital contents are non-suspicious. Status post bilateral ocular lens implants. OTHER: Patient is edentulous. IMPRESSION: 1. No acute intracranial process on this motion degraded examination. 2. Moderate to severe parenchymal brain volume loss and moderate to severe chronic small vessel ischemic disease. Electronically Signed   By: Elon Alas M.D.   On: 06/13/2017 00:25    ASSESSMENT AND PLAN:  1 acute toxic metabolic encephalopathy Secondary to polypharmacy  Resolved Continue to avoid psychotropic meds, MRI of the brain noted for moderate to severe atrophy, ammonia level was negative, urine drug screen noted for benzodiazepines/opioids/try cyclic's, continue neurochecks per routine, aspiration/fall precautions, physical therapy input appreciated-recommending skilled nursing facility placement, neurology input appreciated  2 acute on chronic back pain Noted worsening T12 compression fracture Consult orthopedic surgery for expert opinion/?  Vertebroplasty needs, schedule Tylenol, Neurontin 3 times daily, morphine for breakthrough pain  3 acute fall Most likely secondary to polypharmacy  continue fall precautions  4 chronic diabetes mellitus type 2 Stable on current regiment  5 acute possible UTI Continue empiric Rocephin and follow-up on cultures  6 AKI Secondary to dehydration IV fluids for rehydration and avoid nephrotoxic agents  All the records are reviewed and case discussed with Care Management/Social Workerr. Management plans discussed with the patient, family and they are in agreement.  CODE STATUS: full  TOTAL TIME TAKING CARE OF THIS PATIENT: 45 minutes.     POSSIBLE D/C IN 1-3  DAYS, DEPENDING ON CLINICAL CONDITION.   Jaclyn Peterson M.D on 06/13/2017   Between 7am to 6pm - Pager - [phone]  After 6pm go to [email or web address] - password [email or web address] [facility]  Office  [phone]  CC: Primary care physician; Venia Carbon, MD  Note: This dictation was prepared with Dragon dictation along with smaller phrase technology. Any transcriptional errors that result from this process are unintentional.

## 2017-06-13 NOTE — Evaluation (Signed)
Physical Therapy Evaluation Patient Details Name: Jaclyn Peterson MRN: [id number] DOB: May 16, 1940 Today's Date: 06/13/2017   History of Present Illness  Pt is a 77 year old female admitted from home for confusion and T12 compression fracture following a fall.  Pt has a significant fall hx.  PMH also includes vertigo, uterine CA, small bowel obstruction, pneumonia, pericarditis, osteoporosis, DM, depression, chronic pain.  Clinical Impression  Pt is a 77 year old female who lives in a one story home with her children.  Pt reports being independent at baseline with use of RW starting this week.  She also reports a multiple fall hx related to vertigo.  Pt is in bed upon PT arrival and reports 10/10 pain in her low back due to compression fracture.  She agreed to attempt ambulation and PT assisted pt in donning shoes.  Pt attempted bed mobility and STS but was too limited by pain and required mod assistance.  Pt presented with overall weakness of UE and LE.  PT discussed all that a PT evaluation entails and pt stated that she would like to attempt to get OOB again tomorrow if pain has subsided.  Pt will continue to benefit from skilled PT with focus on functional mobility, pain management, safe use of AD, strength and balance.    Follow Up Recommendations SNF;Supervision for mobility/OOB    Equipment Recommendations       Recommendations for Other Services       Precautions / Restrictions Precautions Precautions: Fall;Back Precaution Booklet Issued: No Precaution Comments: High fall risk, compression fracture T12 Restrictions Weight Bearing Restrictions: No      Mobility  Bed Mobility Overal bed mobility: Needs Assistance Bed Mobility: Supine to Sit;Sit to Supine     Supine to sit: Mod assist;HOB elevated Sit to supine: Mod assist;HOB elevated   General bed mobility comments: Pt limited by pain and requires hand held assist for all bed mobility.  Pt able to scoot up in bed  minimally with VC's from PT.  Transfers Overall transfer level: Needs assistance   Transfers: Sit to/from Stand Sit to Stand: Mod assist         General transfer comment: Pt attempted STS from EOB with assistance from PT but returned to fetal position and laid on L side each time, stating that the pain is too great.  PT did not push pt to attempt a third time.  Ambulation/Gait Ambulation/Gait assistance: (Did no perform due to pain.)              Stairs            Wheelchair Mobility    Modified Rankin (Stroke Patients Only)       Balance Overall balance assessment: Needs assistance Sitting-balance support: Bilateral upper extremity supported;Feet supported   Sitting balance - Comments: Sitting balance affected by LBP at this time.                                     Pertinent Vitals/Pain Pain Assessment: 0-10 Pain Score: 10-Worst pain ever Pain Location: Low back-pt fell while putting a scarf on her head and hit her back across a book stand. Pain Intervention(s): Limited activity within patient's tolerance;Monitored during session    [place] expects to be discharged to:: Private residence Living Arrangements: Children Available Help at Discharge: Family;Available PRN/intermittently Type of Home: House(Work during the day.) Home Access: Stairs to enter  Entrance Stairs-Number of Steps: 1, children help her to navigate stairs  Home Layout: One level Home Equipment: Walker - 2 wheels      Prior Function Level of Independence: Independent with assistive device(s)         Comments: does not drive due to pain in LE's and back.  Children help with groceries.     Hand Dominance   Dominant Hand: Right    Extremity/Trunk Assessment   Upper Extremity Assessment Upper Extremity Assessment: Generalized weakness    Lower Extremity Assessment Lower Extremity Assessment: Generalized weakness    Cervical / Trunk  Assessment Cervical / Trunk Assessment: Kyphotic  Communication   Communication: No difficulties  Cognition Arousal/Alertness: Awake/alert Behavior During Therapy: Restless Overall Cognitive Status: Within Functional Limits for tasks assessed                                        General Comments      Exercises     Assessment/Plan    PT Assessment Patient needs continued PT services  PT Problem List Decreased strength;Decreased mobility;Decreased range of motion;Decreased activity tolerance;Decreased balance;Decreased knowledge of use of DME;Decreased cognition;Cardiopulmonary status limiting activity;Impaired sensation;Pain       PT Treatment Interventions DME instruction;Therapeutic activities;Gait training;Therapeutic exercise;Stair training;Balance training;Functional mobility training;Neuromuscular re-education;Patient/family education;Cognitive remediation    PT Goals (Current goals can be found in the Care Plan section)  Acute Rehab PT Goals Patient Stated Goal: To eventually return home and continue to be generally active. Potential to Achieve Goals: Fair    Frequency Min 2X/week   Barriers to discharge        Co-evaluation               AM-PAC PT "6 Clicks" Daily Activity  Outcome Measure Difficulty turning over in bed (including adjusting bedclothes, sheets and blankets)?: A Lot Difficulty moving from lying on back to sitting on the side of the bed? : A Lot Difficulty sitting down on and standing up from a chair with arms (e.g., wheelchair, bedside commode, etc,.)?: A Lot Help needed moving to and from a bed to chair (including a wheelchair)?: A Lot Help needed walking in hospital room?: Total Help needed climbing 3-5 steps with a railing? : Total 6 Click Score: 10    End of Session Equipment Utilized During Treatment: Gait belt Activity Tolerance: Patient limited by pain Patient left: in bed;with call bell/phone within reach;with  bed alarm set Nurse Communication: Patient requests pain meds(Pt is unable to receive pain medication.) PT Visit Diagnosis: Muscle weakness (generalized) (M62.81);Unsteadiness on feet (R26.81);Pain Pain - part of body: (Low back)    Time: 1000-1020 PT Time Calculation (min) (ACUTE ONLY): 20 min   Charges:   PT Evaluation $PT Eval Moderate Complexity: 1 Mod     PT G Codes:   PT G-Codes **NOT FOR INPATIENT CLASS** Functional Assessment Tool Used: AM-PAC 6 Clicks Basic Mobility    Roxanne Gates, PT, DPT   Roxanne Gates 06/13/2017, 10:40 AM

## 2017-06-13 NOTE — Telephone Encounter (Signed)
I called Jaclyn Peterson to get more info about his concerns and [name] pts son (do not see DPR)  said to call his sister,Christine at [phone] for more info. I was unable to reach G[place] by phone.

## 2017-06-13 NOTE — Progress Notes (Addendum)
Per Magda Paganini, admission coordinator at [facility], [facility] SNF authorization has been recieved. Social Work Theatre manager contacted patient's son [name] and made him aware of above.   Susy Frizzle, Social Work Intern [phone]

## 2017-06-13 NOTE — Care Management Obs Status (Signed)
[facility] NOTIFICATION   Patient Details  Name: Jaclyn Peterson MRN: [id number] Date of Birth: 1941-03-04   Medicare Observation Status Notification Given:  Yes. Permission to sign given per [name].    Shelbie Ammons, RN 06/13/2017, 9:52 AM

## 2017-06-13 NOTE — Clinical Social Work Note (Addendum)
Clinical Social Work Assessment  Patient Details  Name: Jaclyn Peterson MRN: [id number] Date of Birth: 08-Nov-1940  Date of referral:  06/13/17               Reason for consult:  Facility Placement, Discharge Planning                Permission sought to share information with:  Chartered certified accountant granted to share information::  Yes, Verbal Permission Granted  Name::      [place]::   [place]  Relationship::     Contact Information:     Housing/Transportation Living arrangements for the past 2 months:  Independence of Information:  Patient Patient Interpreter Needed:  None Criminal Activity/Legal Involvement Pertinent to Current Situation/Hospitalization:  No - Comment as needed Significant Relationships:  Adult Children Lives with:  Adult Children Do you feel safe going back to the place where you live?  Yes Need for family participation in patient care:  Yes (Comment)  Care giving concerns: Patient lives in [place] with daughter Jaclyn Peterson [phone]).   Social Worker assessment / plan: Holiday representative (CSW) reviewed patient's chart and noted that PT is recommending SNF. Social work Theatre manager met with patient alone at bedside. Patient was asleep but easy to wake, and alert and oriented to self, place, situation. Social work Theatre manager introduced self and explained the role of the Tea. Patient shared she lives in [place] with her daughter and son Jaclyn Peterson [phone]) is her HPOA. Patient also shared she is not currently receiving treatment for cancer. Social work Theatre manager explained that PT is recommending SNF and [employer] will have to approve SNF placement. Patient verbalized her understanding but was unsure if she wants to go to SNF and asked Social work intern to contact her son. Social work Theatre manager attempted to contact patient's son and a voicemail was left. Social work Theatre manager contacted patient's  daughter who confirmed above, shared that she was in middle school when her mother received cancer treatment, and is agreeable to SNF placement.  Social work Theatre manager received a call from patient's son who is also agreeable to SNF placement. FL2 completed and faxed out. CSW and social work Theatre manager will continue to follow up and assist.   Employment status:  Retired Nurse, adult PT Recommendations:  [place] / Referral to community resources:  [place]  Patient/Family's Response to care: Patient's children are agreeable to SNF placement.  Patient/Family's Understanding of and Emotional Response to Diagnosis, Current Treatment, and Prognosis: Patient and her children were pleasant and thanked social work Theatre manager for her assistance.  Emotional Assessment Appearance:  Appears stated age Attitude/Demeanor/Rapport:    Affect (typically observed):  Accepting, Pleasant, Calm Orientation:  Oriented to Self, Oriented to Place, Oriented to Situation Alcohol / Substance use:  Not Applicable Psych involvement (Current and /or in the community):  No (Comment)  Discharge Needs  Concerns to be addressed:  Care Coordination, Discharge Planning Concerns Readmission within the last 30 days:  No Current discharge risk:  Dependent with Mobility Barriers to Discharge:  Continued Medical Work up   Jaclyn Peterson, Student-Social Work 06/13/2017, 11:40 AM

## 2017-06-13 NOTE — Progress Notes (Deleted)
Went back to evaluate patient after receiving 62m of Meclizine.  Patient refuses evaluation at this time.    LAlexis Goodell MD Neurology [phone]

## 2017-06-13 NOTE — Telephone Encounter (Signed)
Copied from [place] [phone]. Topic: General - Other >> Jun 13, 2017 12:16 PM Darl Householder, RMA wrote: Reason for CRM: Patient's son [name] is requesting a call back from Dr. Silvio Pate concerning patient issues, please return call

## 2017-06-14 DIAGNOSIS — S22089A Unspecified fracture of T11-T12 vertebra, initial encounter for closed fracture: Secondary | ICD-10-CM | POA: Diagnosis present

## 2017-06-14 LAB — CBC WITH DIFFERENTIAL/PLATELET
Basophils Absolute: 0.1 10*3/uL (ref 0–0.1)
Basophils Relative: 1 %
EOS ABS: 0.2 10*3/uL (ref 0–0.7)
EOS PCT: 2 %
HCT: 39 % (ref 35.0–47.0)
HEMOGLOBIN: 13.1 g/dL (ref 12.0–16.0)
LYMPHS ABS: 2.2 10*3/uL (ref 1.0–3.6)
LYMPHS PCT: 21 %
MCH: 30.8 pg (ref 26.0–34.0)
MCHC: 33.5 g/dL (ref 32.0–36.0)
MCV: 91.9 fL (ref 80.0–100.0)
MONOS PCT: 13 %
Monocytes Absolute: 1.4 10*3/uL — ABNORMAL HIGH (ref 0.2–0.9)
NEUTROS PCT: 63 %
Neutro Abs: 6.7 10*3/uL — ABNORMAL HIGH (ref 1.4–6.5)
Platelets: 420 10*3/uL (ref 150–440)
RBC: 4.24 MIL/uL (ref 3.80–5.20)
RDW: 14 % (ref 11.5–14.5)
WBC: 10.7 10*3/uL (ref 3.6–11.0)

## 2017-06-14 LAB — GLUCOSE, CAPILLARY
GLUCOSE-CAPILLARY: 177 mg/dL — AB (ref 65–99)
GLUCOSE-CAPILLARY: 151 mg/dL — AB (ref 65–99)
Glucose-Capillary: 230 mg/dL — ABNORMAL HIGH (ref 65–99)

## 2017-06-14 MED ORDER — LOSARTAN POTASSIUM 50 MG PO TABS
100.0000 mg | ORAL_TABLET | Freq: Every day | ORAL | Status: DC
  Administered 2017-06-14 – 2017-06-15 (×2): 100 mg via ORAL
  Filled 2017-06-14 (×2): qty 2

## 2017-06-14 MED ORDER — CEFAZOLIN SODIUM-DEXTROSE 1-4 GM/50ML-% IV SOLN
1.0000 g | INTRAVENOUS | Status: AC
  Administered 2017-06-15: 1 g via INTRAVENOUS
  Filled 2017-06-14: qty 50

## 2017-06-14 MED ORDER — OXYCODONE HCL 5 MG PO TABS
5.0000 mg | ORAL_TABLET | ORAL | Status: DC | PRN
  Administered 2017-06-14 (×5): 5 mg via ORAL
  Filled 2017-06-14 (×5): qty 1

## 2017-06-14 MED ORDER — CEFAZOLIN (ANCEF) 1 G IV SOLR: 1.0000 g | INTRAVENOUS | Status: DC

## 2017-06-14 NOTE — Progress Notes (Signed)
Paged MD r/t elevated BP. Awaiting response.

## 2017-06-14 NOTE — Progress Notes (Signed)
Plan is for patient to D/C to [facility] after kyphoplasty tomorrow. Patient and her son [name] and daughter [name] are aware of above. [facility] admissions coordinator at [facility] is aware of above.   [employer], LCSW [phone]

## 2017-06-14 NOTE — Plan of Care (Signed)
  Problem: Education: Goal: Knowledge of General Education information will improve Outcome: Progressing   Problem: Clinical Measurements: Goal: Will remain free from infection Outcome: Progressing Goal: Respiratory complications will improve Outcome: Progressing   Problem: Coping: Goal: Level of anxiety will decrease Outcome: Progressing   Problem: Pain Managment: Goal: General experience of comfort will improve Outcome: Progressing   Problem: Safety: Goal: Ability to remain free from injury will improve Outcome: Progressing   Problem: Skin Integrity: Goal: Risk for impaired skin integrity will decrease Outcome: Progressing   Problem: Elimination: Goal: Will not experience complications related to urinary retention Outcome: Not Progressing

## 2017-06-14 NOTE — Consult Note (Signed)
ORTHOPAEDIC CONSULTATION  REQUESTING PHYSICIAN: Salary, Avel Peace, MD  Chief Complaint:   Lower back pain.  History of Present Illness: Jaclyn Peterson is a 77 y.o. female with multiple medical problems including hypertension, hyperlipidemia, diabetes, osteoporosis, and depression who lives with her kids.  The patient was in her usual state of health approximate 1 week ago when she apparently lost her balance and fell backwards, striking the back of her head and injuring her lower back.  She describes increased pain in her lower back but did not seek treatment until 2 days ago.  She was brought to the emergency room by her family where x-rays demonstrated a new T12 compression fracture measuring approximately 10-20%, thus prompting a request for orthopedic input.  The patient denies any numbness or paresthesias to either lower extremity.  She apparently had difficulty voiding yesterday (According to the patient, it hurt too much to urinate.), so a Foley catheter was inserted last evening, draining 1100 cc.  Past Medical History:  Diagnosis Date  . Celiac disease   . Chronic pain   . Depression   . Diabetes mellitus   . Falls frequently    Falls/pneumonia 1/12  . Hyperlipidemia   . Hypertension   . Migraines   . Neuropathy   . Osteoporosis   . Pericarditis   . Pneumonia   . SBO (small bowel obstruction) (HCC)    obstipation  10/2005  . 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 Vertigo    Past Surgical History:  Procedure Laterality Date  . ABDOMINAL HYSTERECTOMY     BSO 1985  cancer  . APPENDECTOMY     1946  Adhesions shortly after  . BACK SURGERY  1970's  . BLADDER REPAIR     bladder tacking- chronic pain since  1998  . CATARACT EXTRACTION  12/12   left and then laser---Dr Tobe Sos  . CATARACT EXTRACTION W/PHACO Right 06/07/2016   Procedure: CATARACT EXTRACTION PHACO AND INTRAOCULAR LENS PLACEMENT (IOC);   Surgeon: Birder Robson, MD;  Location: [facility] ORS;  Service: Ophthalmology;  Laterality: Right;  [place] 00:43 AP% 20.6 CDE 8.96 Fluid pack lot # [id number]  . CHOLECYSTECTOMY     lysis of adhesions--post op complications, trach, etc  8/10  . KIDNEY STONE SURGERY     cysto-- [place] 2003   Social History   Socioeconomic History  . Marital status: Widowed    Spouse name: Not on file  . Number of children: 2  . Years of education: Not on file  . Highest education level: Not on file  Occupational History  . 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 Financial resource strain: Patient refused  . Food insecurity:    Worry: Patient refused    Inability: Patient refused  . Transportation needs:    Medical: Patient refused    Non-medical: Patient refused  Tobacco Use  . Smoking status: Current Some Day Smoker  . Smokeless tobacco: Never Used  . Tobacco comment: only once in a while. Gave info on 1-800-QUIT NOW  Substance and Sexual Activity  . Alcohol use: No    Alcohol/week: 0.0 oz  . Drug use: No  . Sexual activity: Not Currently  Lifestyle  . Physical activity:    Days per week: Patient refused    Minutes per session: Patient refused  . Stress: Patient refused  Relationships  . Social connections:    Talks on phone: Patient refused    Gets together: Patient  refused    Attends religious service: Patient refused    Active member of club or organization: Patient refused    Attends meetings of clubs or organizations: Patient refused    Relationship status: Patient refused  Other Topics Concern  . Not on file  Social History Narrative   Enjoys crafts      No living will   Daughter, SIL, kids living with her know   Son [name] and daughter [name] should make health care decisions if POA needed   Would accept resuscitation attempts but no prolonged artificial ventilation   No feeding tubes if cognitively unaware         Family History  Problem  Relation Age of Onset  . Coronary artery disease Father   . Hypertension Neg Hx   . Diabetes Neg Hx    Allergies  Allergen Reactions  . Aspirin     Able to take baby aspirin  . Bisoprolol Fumarate     REACTION: ha  . Chlorthalidone   . Lisinopril     REACTION: throat symptoms  . Sulfa Antibiotics     Told by mother as a child  . Tetracyclines & Related Swelling    Throat swelling   Prior to Admission medications   Medication Sig Start Date End Date Taking? Authorizing Provider  amLODipine (NORVASC) 10 MG tablet TAKE 1 TABLET(10 MG) BY MOUTH DAILY 04/24/17  Yes Venia Carbon, MD  aspirin 81 MG tablet Take 81 mg by mouth daily.     Yes [provider]  cholestyramine (QUESTRAN) 4 g packet MIX AND DRINK 1 PACKET BY MOUTH DAILY AS DIRECTED 03/20/17  Yes Viviana Simpler I, MD  DULoxetine (CYMBALTA) 60 MG capsule TAKE 1 CAPSULE BY MOUTH TWICE DAILY 05/11/17  Yes Viviana Simpler I, MD  FLUoxetine (PROZAC) 40 MG capsule TAKE 1 CAPSULE BY MOUTH EVERY DAY 12/29/16  Yes Viviana Simpler I, MD  glipiZIDE (GLUCOTROL) 5 MG tablet TAKE 1 TABLET BY MOUTH TWICE DAILY BEFORE A MEAL 01/24/17  Yes Viviana Simpler I, MD  JANUVIA 100 MG tablet TAKE 1 TABLET(100 MG) BY MOUTH DAILY 01/24/17  Yes Viviana Simpler I, MD  losartan (COZAAR) 25 MG tablet TAKE 1 TABLET(25 MG) BY MOUTH DAILY 04/24/17  Yes Viviana Simpler I, MD  metFORMIN (GLUCOPHAGE-XR) 500 MG 24 hr tablet TAKE 4 TABLETS BY MOUTH EVERY DAY WITH BREAKFAST 12/29/16  Yes Venia Carbon, MD  propranolol (INDERAL) 20 MG tablet Take 1 tablet (20 mg total) by mouth 2 (two) times daily. 03/15/17  Yes Venia Carbon, MD  simvastatin (ZOCOR) 20 MG tablet TAKE 1 TABLET(20 MG) BY MOUTH DAILY 05/25/17  Yes Venia Carbon, MD  ACCU-CHEK AVIVA PLUS test strip USE AS DIRECTED 03/08/17   Viviana Simpler I, MD  Blood Glucose Monitoring Suppl (ACCU-CHEK AVIVA PLUS) w/Device KIT TEST BLOOD SUGAR DAILY AS DIRECTED 03/08/17   Venia Carbon, MD   diazepam (VALIUM) 2 MG tablet TAKE 1 TABLET(2 MG) BY MOUTH TWICE DAILY AS NEEDED 06/09/17   Lisa Roca, MD  dicyclomine (BENTYL) 20 MG tablet Take 1 tablet (20 mg total) by mouth every 6 (six) hours as needed. 03/29/17   Paulette Blanch, MD  lactulose (CHRONULAC) 10 GM/15ML solution Take 30 mLs (20 g total) by mouth daily as needed for mild constipation. 03/29/17   Paulette Blanch, MD  Lancets (ACCU-CHEK SOFT TOUCH) lancets Use as instructed.  Dx code:  E11.40 02/17/16   Venia Carbon, MD  meclizine (ANTIVERT) 25  MG tablet TAKE 1 TABLET(25 MG) BY MOUTH THREE TIMES DAILY AS NEEDED FOR VERTIGO 03/23/17   Venia Carbon, MD  ondansetron (ZOFRAN ODT) 4 MG disintegrating tablet Take 1 tablet (4 mg total) by mouth every 8 (eight) hours as needed for nausea or vomiting. 03/29/17   Paulette Blanch, MD  tiZANidine (ZANAFLEX) 2 MG tablet TAKE 1 TABLET BY MOUTH TWICE DAILY AS NEEDED FOR MUSCLE SPASMS 03/06/17   Venia Carbon, MD  traMADol (ULTRAM) 50 MG tablet TAKE 1 TABLET(50 MG) BY MOUTH THREE TIMES DAILY AS NEEDED FOR PAIN 06/09/17   Lisa Roca, MD   Dg Chest 2 View  Result Date: 06/13/2017 CLINICAL DATA:  Confusion. History of hypertension and diabetes, current smoker. EXAM: CHEST - 2 VIEW COMPARISON:  PA and lateral chest x-ray of March 06, 2015 and thoracic spine series of June 12, 2017. FINDINGS: The lungs are well-expanded. There is no focal infiltrate. The interstitial markings are coarse though stable. The heart and pulmonary vascularity are normal. There is calcification in the wall of the aortic arch. There has been further compression of the body of T12 with loss of height anteriorly now of approximately 30-40% and posteriorly 15%. IMPRESSION: Chronic bronchitic changes, stable. No pneumonia, CHF, nor other acute cardiopulmonary abnormality. Slight interval worsening in the degree of compression of T12 since yesterday's study. Electronically Signed   By: David  Martinique M.D.   On: 06/13/2017 08:15    Dg Thoracic Spine 2 View  Result Date: 06/12/2017 CLINICAL DATA:  Multiple recent falls. Altered mental status. Previous spinal fracture. EXAM: THORACIC SPINE 2 VIEWS COMPARISON:  Radiographs 06/09/2017 and 05/06/2014. CT lumbar spine 06/09/2017. FINDINGS: Twelve rib-bearing thoracic type vertebral bodies with small ribs at T12. Superior endplate compression fracture appears unchanged from the recent studies with 10-20% loss of vertebral body height. There is no widening of the interpedicular distance. Chronic Schmorl's node involving the superior endplate of L2. The alignment is normal. No new fractures are seen. IMPRESSION: Superior endplate compression fracture at T12 appears unchanged from recent studies. No new findings. Electronically Signed   By: Richardean Sale M.D.   On: 06/12/2017 14:09   Dg Pelvis 1-2 Views  Result Date: 06/12/2017 CLINICAL DATA:  Recent falls.  Recent T12 fracture. EXAM: PELVIS - 1-2 VIEW COMPARISON:  Lumbar spine radiographs 06/09/2017. Abdominopelvic CT 03/28/2017. FINDINGS: The bones are mildly demineralized. No evidence of acute fracture or dislocation. There is transitional anatomy in the lower lumbar spine with postsurgical changes. Postsurgical changes are also present in the right iliac bone as correlated with prior CT. There are mild sacroiliac degenerative changes bilaterally. IMPRESSION: No acute osseous findings. Electronically Signed   By: Richardean Sale M.D.   On: 06/12/2017 14:11   Ct Head Wo Contrast  Result Date: 06/12/2017 CLINICAL DATA:  Several recent falls. Altered mental status with disorientation slurred speech EXAM: CT HEAD WITHOUT CONTRAST TECHNIQUE: Contiguous axial images were obtained from the base of the skull through the vertex without intravenous contrast. COMPARISON:  June 09, 2017 FINDINGS: Brain: There is stable moderate diffuse atrophy. There is no intracranial mass, hemorrhage, extra-axial fluid collection, or midline shift. Small vessel  disease throughout the centra semiovale is stable. There is no new gray-white compartment lesion. No acute infarct evident. Vascular: No hyperdense vessel. There is calcification in each carotid siphon and distal left vertebral artery regions. Skull: Bony calvarium appears intact. Sinuses/Orbits: Mild mucosal thickening in several ethmoid air cells is stable. Other visualized paranasal sinuses are clear. Visualized  orbits appear symmetric bilaterally. Other: Visualized mastoid air cells are clear. IMPRESSION: Stable atrophy with supratentorial small vessel disease. No new gray-white compartment lesion. No mass or hemorrhage. Stable foci of arterial vascular calcification. Mucosal thickening in several ethmoid air cells. Electronically Signed   By: Lowella Grip III M.D.   On: 06/12/2017 13:44   Mr Brain Wo Contrast  Result Date: 06/13/2017 CLINICAL DATA:  Worsening mental altered mental status, worsening after fall last week. History of migraines, hypertension, hyperlipidemia. EXAM: MRI HEAD WITHOUT CONTRAST TECHNIQUE: Multiplanar, multiecho pulse sequences of the brain and surrounding structures were obtained without intravenous contrast. COMPARISON:  CT HEAD June 12, 2016 FINDINGS: Moderately motion degraded examination. INTRACRANIAL CONTENTS: No reduced diffusion to suggest acute ischemia, typical infection or hypercellular tumor. No susceptibility artifact to suggest hemorrhage. Moderate to severe parenchymal brain volume loss. Confluent supratentorial white matter FLAIR T2 hyperintensities. No midline shift, mass effect or masses though limited by patient motion. No abnormal extra-axial fluid collections. No extra-axial masses. VASCULAR: Normal major intracranial vascular flow voids present at skull base. SKULL AND UPPER CERVICAL SPINE: No abnormal sellar expansion. No suspicious calvarial bone marrow signal. Craniocervical junction maintained. SINUSES/ORBITS: The mastoid air-cells and included paranasal  sinuses are well-aerated.The included ocular globes and orbital contents are non-suspicious. Status post bilateral ocular lens implants. OTHER: Patient is edentulous. IMPRESSION: 1. No acute intracranial process on this motion degraded examination. 2. Moderate to severe parenchymal brain volume loss and moderate to severe chronic small vessel ischemic disease. Electronically Signed   By: Elon Alas M.D.   On: 06/13/2017 00:25    Positive ROS: All other systems have been reviewed and were otherwise negative with the exception of those mentioned in the HPI and as above.  Physical Exam: General:  Alert, no acute distress Psychiatric:  Patient is competent for consent with normal mood and affect   Cardiovascular:  No pedal edema Respiratory:  No wheezing, non-labored breathing GI:  Abdomen is soft and non-tender Skin:  No lesions in the area of chief complaint Neurologic:  Sensation intact distally Lymphatic:  No axillary or cervical lymphadenopathy  Orthopedic Exam:  Orthopedic examination is limited to the back and lower extremities.  Skin inspection of the back is unremarkable.  There is no swelling, erythema, ecchymosis, or abrasions.  She has moderate tenderness to percussion over the mid lower back region in the area of the thoracolumbar junction, and mild tenderness to percussion proximal and distal to this point.  She has pain when attempting to sit up and roll over in bed.  She is neurovascularly intact in both lower extremities as she is able to actively dorsiflex and plantarflex both toes and ankles with good strength.  Sensation is intact light touch to all distributions.  She has good capillary refill to all digits.  X-rays:  X-rays of the thoracic spine are available for review.  These films demonstrate an acute/subacute superior endplate fracture of T61 with approximately 10-20% compression.  No lytic lesions are identified.  This fracture also apparently was observed on a CT  scan from 06/09/17.  Assessment: Acute/subacute T12 compression fracture.  Plan: The treatment options are discussed with the patient.  The patient is quite frustrated by the pain she is experiencing and would be interested in considering a kyphoplasty.  Therefore, I will discuss this patient's case with Dr. Rudene Christians to see if he feels that she might be a good candidate for a kyphoplasty.  Meanwhile, the patient may be mobilized as symptoms permit.  Appropriate pain medication is to be made available to her.  Thank you for asked me to participate in the care of this most unfortunate woman.  Either Dr. Rudene Christians or I will be happy to follow her with you.   Pascal Lux, MD  Beeper #:  [phone]  06/14/2017 7:26 AM

## 2017-06-14 NOTE — Progress Notes (Signed)
Discussed kyphoplasty with patient and son.  Reviewed bone model and brochure. Plan kyphoplasty in am, possible discharge after. Risks, benefits and alternative discussed. Site marked.

## 2017-06-14 NOTE — Telephone Encounter (Signed)
I spoke to her yesterday in the hospital I had informed the ER or admitting physician about my concerns with her past overuse of pain/tranquilizing medications. She did have an apparent new compression fracture--so she might legitimately be in pain (but also prone to improper medication use). I do get the impression she will wind up in rehab after the hospital---will follow along

## 2017-06-14 NOTE — Progress Notes (Signed)
PT Cancellation Note  Patient Details Name: Jaclyn Peterson MRN: [id number] DOB: August 24, 1940   Cancelled Treatment:    Reason Eval/Treat Not Completed: Patient declined, no reason specified;Pain limiting ability to participate.  NA with pt upon PT arrival.  Pt states that she does not feel that she is well enough to participate in therapy at this time anyway.  PT issued a core strengthening HEP and advised concerning safe use following kyphoplasty.   Roxanne Gates, PT, DPT 06/14/2017, 4:37 PM

## 2017-06-14 NOTE — Progress Notes (Signed)
[facility] NAME: Jaclyn Peterson    MR#:  [id number]  DATE OF BIRTH:  12-21-1940  SUBJECTIVE:  CHIEF COMPLAINT:   Chief Complaint  Patient presents with  . Back Pain  Patient continues to complain of intermittent back pain, son at the bedside-family meeting was had with all questions answered, possible plans for kyphoplasty tomorrow by orthopedic surgery  REVIEW OF SYSTEMS:  CONSTITUTIONAL: No fever, fatigue or weakness.  EYES: No blurred or double vision.  EARS, NOSE, AND THROAT: No tinnitus or ear pain.  RESPIRATORY: No cough, shortness of breath, wheezing or hemoptysis.  CARDIOVASCULAR: No chest pain, orthopnea, edema.  GASTROINTESTINAL: No nausea, vomiting, diarrhea or abdominal pain.  GENITOURINARY: No dysuria, hematuria.  ENDOCRINE: No polyuria, nocturia,  HEMATOLOGY: No anemia, easy bruising or bleeding SKIN: No rash or lesion. MUSCULOSKELETAL: No joint pain or arthritis.   NEUROLOGIC: No tingling, numbness, weakness.  PSYCHIATRY: No anxiety or depression.   ROS  DRUG ALLERGIES:   Allergies  Allergen Reactions  . Aspirin     Able to take baby aspirin  . Bisoprolol Fumarate     REACTION: ha  . Chlorthalidone   . Lisinopril     REACTION: throat symptoms  . Sulfa Antibiotics     Told by mother as a child  . Tetracyclines & Related Swelling    Throat swelling    VITALS:  Blood pressure (!) 191/65, pulse 74, temperature 98.8 F (37.1 C), temperature source Oral, resp. rate 19, height 5' 5"  (1.651 m), weight 69.4 kg (153 lb), SpO2 96 %.  PHYSICAL EXAMINATION:  GENERAL:  77 y.o.-year-old patient lying in the bed with no acute distress.  EYES: Pupils equal, round, reactive to light and accommodation. No scleral icterus. Extraocular muscles intact.  HEENT: Head atraumatic, normocephalic. Oropharynx and nasopharynx clear.  NECK:  Supple, no jugular venous distention. No thyroid enlargement, no tenderness.   LUNGS: Normal breath sounds bilaterally, no wheezing, rales,rhonchi or crepitation. No use of accessory muscles of respiration.  CARDIOVASCULAR: S1, S2 normal. No murmurs, rubs, or gallops.  ABDOMEN: Soft, nontender, nondistended. Bowel sounds present. No organomegaly or mass.  EXTREMITIES: No pedal edema, cyanosis, or clubbing.  NEUROLOGIC: Cranial nerves II through XII are intact. Muscle strength 5/5 in all extremities. Sensation intact. Gait not checked.  PSYCHIATRIC: The patient is alert and oriented x 3.  SKIN: No obvious rash, lesion, or ulcer.   Physical Exam LABORATORY PANEL:   CBC Recent Labs  Lab 06/14/17 0636  WBC 10.7  HGB 13.1  HCT 39.0  PLT 420   ------------------------------------------------------------------------------------------------------------------  Chemistries  Recent Labs  Lab 06/12/17 1441 06/13/17 0518  NA 137 136  K 3.9 3.4*  CL 107 107  CO2 13* 17*  GLUCOSE 144* 156*  BUN 29* 25*  CREATININE 1.13* 0.86  CALCIUM 8.9 8.7*  AST 23  --   ALT 29  --   ALKPHOS 138*  --   BILITOT 1.0  --    ------------------------------------------------------------------------------------------------------------------  Cardiac Enzymes Recent Labs  Lab 06/09/17 0751 06/12/17 1441  TROPONINI 0.03* 0.04*   ------------------------------------------------------------------------------------------------------------------  RADIOLOGY:  Dg Chest 2 View  Result Date: 06/13/2017 CLINICAL DATA:  Confusion. History of hypertension and diabetes, current smoker. EXAM: CHEST - 2 VIEW COMPARISON:  PA and lateral chest x-ray of March 06, 2015 and thoracic spine series of June 12, 2017. FINDINGS: The lungs are well-expanded. There is no focal infiltrate. The interstitial markings are coarse though stable. The heart  and pulmonary vascularity are normal. There is calcification in the wall of the aortic arch. There has been further compression of the body of T12 with  loss of height anteriorly now of approximately 30-40% and posteriorly 15%. IMPRESSION: Chronic bronchitic changes, stable. No pneumonia, CHF, nor other acute cardiopulmonary abnormality. Slight interval worsening in the degree of compression of T12 since yesterday's study. Electronically Signed   By: David  Martinique M.D.   On: 06/13/2017 08:15   Dg Thoracic Spine 2 View  Result Date: 06/12/2017 CLINICAL DATA:  Multiple recent falls. Altered mental status. Previous spinal fracture. EXAM: THORACIC SPINE 2 VIEWS COMPARISON:  Radiographs 06/09/2017 and 05/06/2014. CT lumbar spine 06/09/2017. FINDINGS: Twelve rib-bearing thoracic type vertebral bodies with small ribs at T12. Superior endplate compression fracture appears unchanged from the recent studies with 10-20% loss of vertebral body height. There is no widening of the interpedicular distance. Chronic Schmorl's node involving the superior endplate of L2. The alignment is normal. No new fractures are seen. IMPRESSION: Superior endplate compression fracture at T12 appears unchanged from recent studies. No new findings. Electronically Signed   By: Richardean Sale M.D.   On: 06/12/2017 14:09   Dg Pelvis 1-2 Views  Result Date: 06/12/2017 CLINICAL DATA:  Recent falls.  Recent T12 fracture. EXAM: PELVIS - 1-2 VIEW COMPARISON:  Lumbar spine radiographs 06/09/2017. Abdominopelvic CT 03/28/2017. FINDINGS: The bones are mildly demineralized. No evidence of acute fracture or dislocation. There is transitional anatomy in the lower lumbar spine with postsurgical changes. Postsurgical changes are also present in the right iliac bone as correlated with prior CT. There are mild sacroiliac degenerative changes bilaterally. IMPRESSION: No acute osseous findings. Electronically Signed   By: Richardean Sale M.D.   On: 06/12/2017 14:11   Ct Head Wo Contrast  Result Date: 06/12/2017 CLINICAL DATA:  Several recent falls. Altered mental status with disorientation slurred speech  EXAM: CT HEAD WITHOUT CONTRAST TECHNIQUE: Contiguous axial images were obtained from the base of the skull through the vertex without intravenous contrast. COMPARISON:  June 09, 2017 FINDINGS: Brain: There is stable moderate diffuse atrophy. There is no intracranial mass, hemorrhage, extra-axial fluid collection, or midline shift. Small vessel disease throughout the centra semiovale is stable. There is no new gray-white compartment lesion. No acute infarct evident. Vascular: No hyperdense vessel. There is calcification in each carotid siphon and distal left vertebral artery regions. Skull: Bony calvarium appears intact. Sinuses/Orbits: Mild mucosal thickening in several ethmoid air cells is stable. Other visualized paranasal sinuses are clear. Visualized orbits appear symmetric bilaterally. Other: Visualized mastoid air cells are clear. IMPRESSION: Stable atrophy with supratentorial small vessel disease. No new gray-white compartment lesion. No mass or hemorrhage. Stable foci of arterial vascular calcification. Mucosal thickening in several ethmoid air cells. Electronically Signed   By: Lowella Grip III M.D.   On: 06/12/2017 13:44   Mr Brain Wo Contrast  Result Date: 06/13/2017 CLINICAL DATA:  Worsening mental altered mental status, worsening after fall last week. History of migraines, hypertension, hyperlipidemia. EXAM: MRI HEAD WITHOUT CONTRAST TECHNIQUE: Multiplanar, multiecho pulse sequences of the brain and surrounding structures were obtained without intravenous contrast. COMPARISON:  CT HEAD June 12, 2016 FINDINGS: Moderately motion degraded examination. INTRACRANIAL CONTENTS: No reduced diffusion to suggest acute ischemia, typical infection or hypercellular tumor. No susceptibility artifact to suggest hemorrhage. Moderate to severe parenchymal brain volume loss. Confluent supratentorial white matter FLAIR T2 hyperintensities. No midline shift, mass effect or masses though limited by patient motion.  No abnormal extra-axial fluid collections.  No extra-axial masses. VASCULAR: Normal major intracranial vascular flow voids present at skull base. SKULL AND UPPER CERVICAL SPINE: No abnormal sellar expansion. No suspicious calvarial bone marrow signal. Craniocervical junction maintained. SINUSES/ORBITS: The mastoid air-cells and included paranasal sinuses are well-aerated.The included ocular globes and orbital contents are non-suspicious. Status post bilateral ocular lens implants. OTHER: Patient is edentulous. IMPRESSION: 1. No acute intracranial process on this motion degraded examination. 2. Moderate to severe parenchymal brain volume loss and moderate to severe chronic small vessel ischemic disease. Electronically Signed   By: Elon Alas M.D.   On: 06/13/2017 00:25    ASSESSMENT AND PLAN:  1 acute on chronic worsening T12 vertebral body fracture  acute on chronic back pain noted Orthopedic surgery input appreciated-possible kyphoplasty on tomorrow, continue adult pain protocol, change to admission status  2  acute toxic metabolic encephalopathy Resolved Secondary to polypharmacy  Continue to avoid psychotropic meds, MRI of the brain noted for moderate to severe atrophy, ammonia level was negative, urine drug screen noted for benzodiazepines/opioids/try cyclic's, continue neurochecks per routine, aspiration/fall precautions, physical therapy input appreciated-recommending skilled nursing facility placement, neurology input appreciated  3 acute fall Most likely secondary to polypharmacy  continue fall precautions  4 chronic diabetes mellitus type 2 Stable on current regiment  5 acute possible UTI Continue empiric Rocephin for 3-day course and follow-up on cultures  6 AKI Resolved with IV fluids for rehydration Secondary to dehydration Continue to avoid nephrotoxic agents   All the records are reviewed and case discussed with Care Management/Social Workerr. Management plans  discussed with the patient, family and they are in agreement.  CODE STATUS: full  TOTAL TIME TAKING CARE OF THIS PATIENT: 45 minutes.     POSSIBLE D/C IN 1-3 DAYS, DEPENDING ON CLINICAL CONDITION.   Avel Peace Urban Naval M.D on 06/14/2017   Between 7am to 6pm - Pager - [phone]  After 6pm go to [email or web address] - password [email or web address] [facility]  Office  [phone]  CC: Primary care physician; Venia Carbon, MD  Note: This dictation was prepared with Dragon dictation along with smaller phrase technology. Any transcriptional errors that result from this process are unintentional.

## 2017-06-14 NOTE — Progress Notes (Signed)
Contacted MD r/t elevated BP. Will administer PRN BP med and stopped fluids per MD order.

## 2017-06-15 ENCOUNTER — Inpatient Hospital Stay: Payer: Medicare Other | Admitting: Anesthesiology

## 2017-06-15 ENCOUNTER — Encounter
Admission: EM | Disposition: A | Discharge status: Skilled Nursing Facility | Payer: Self-pay | Source: Home / Self Care | Attending: Emergency Medicine

## 2017-06-15 ENCOUNTER — Inpatient Hospital Stay: Payer: Medicare Other

## 2017-06-15 HISTORY — PX: KYPHOPLASTY: SHX5884

## 2017-06-15 LAB — GLUCOSE, CAPILLARY
GLUCOSE-CAPILLARY: 185 mg/dL — AB (ref 65–99)
GLUCOSE-CAPILLARY: 223 mg/dL — AB (ref 65–99)
GLUCOSE-CAPILLARY: 194 mg/dL — AB (ref 65–99)

## 2017-06-15 SURGERY — KYPHOPLASTY
Anesthesia: General | Site: Spine Thoracic | Wound class: Clean

## 2017-06-15 MED ORDER — DOCUSATE SODIUM 100 MG PO CAPS
100.0000 mg | ORAL_CAPSULE | Freq: Two times a day (BID) | ORAL | Status: DC
  Administered 2017-06-15: 10:00:00 100 mg via ORAL
  Filled 2017-06-15: qty 1

## 2017-06-15 MED ORDER — ACETAMINOPHEN 500 MG PO TABS: 500.0000 mg | ORAL_TABLET | Freq: Three times a day (TID) | ORAL | 0 refills | Status: DC

## 2017-06-15 MED ORDER — LABETALOL HCL 5 MG/ML IV SOLN
INTRAVENOUS | Status: DC | PRN
  Administered 2017-06-15 (×2): 5 mg via INTRAVENOUS

## 2017-06-15 MED ORDER — KETAMINE HCL 50 MG/ML IJ SOLN
INTRAMUSCULAR | Status: DC | PRN
  Administered 2017-06-15: 50 mg via INTRAMUSCULAR

## 2017-06-15 MED ORDER — MIDAZOLAM HCL 2 MG/2ML IJ SOLN
INTRAMUSCULAR | Status: DC | PRN
  Administered 2017-06-15 (×2): 1 mg via INTRAVENOUS

## 2017-06-15 MED ORDER — FENTANYL CITRATE (PF) 100 MCG/2ML IJ SOLN
INTRAMUSCULAR | Status: AC
  Administered 2017-06-15: 50 ug via INTRAVENOUS
  Filled 2017-06-15: qty 2

## 2017-06-15 MED ORDER — OXYCODONE HCL 5 MG PO TABS
5.0000 mg | ORAL_TABLET | Freq: Once | ORAL | Status: AC | PRN
  Administered 2017-06-15: 5 mg via ORAL

## 2017-06-15 MED ORDER — MEPERIDINE HCL 50 MG/ML IJ SOLN: 6.2500 mg | INTRAMUSCULAR | Status: DC | PRN

## 2017-06-15 MED ORDER — ONDANSETRON HCL 4 MG/2ML IJ SOLN: 4.0000 mg | Freq: Four times a day (QID) | INTRAMUSCULAR | Status: DC | PRN

## 2017-06-15 MED ORDER — FENTANYL CITRATE (PF) 100 MCG/2ML IJ SOLN
25.0000 ug | INTRAMUSCULAR | Status: DC | PRN
  Administered 2017-06-15 (×3): 50 ug via INTRAVENOUS

## 2017-06-15 MED ORDER — BUPIVACAINE-EPINEPHRINE (PF) 0.5% -1:200000 IJ SOLN
INTRAMUSCULAR | Status: DC | PRN
  Administered 2017-06-15: 20 mL

## 2017-06-15 MED ORDER — METOCLOPRAMIDE HCL 5 MG PO TABS: 5.0000 mg | ORAL_TABLET | Freq: Three times a day (TID) | ORAL | Status: DC | PRN

## 2017-06-15 MED ORDER — MIDAZOLAM HCL 2 MG/2ML IJ SOLN
INTRAMUSCULAR | Status: AC
  Filled 2017-06-15: qty 2

## 2017-06-15 MED ORDER — OXYCODONE HCL 5 MG PO TABS: 5.0000 mg | ORAL_TABLET | ORAL | 0 refills | Status: DC | PRN

## 2017-06-15 MED ORDER — DOCUSATE SODIUM 100 MG PO CAPS: 100.0000 mg | ORAL_CAPSULE | Freq: Two times a day (BID) | ORAL | 0 refills | Status: DC

## 2017-06-15 MED ORDER — PROPOFOL 500 MG/50ML IV EMUL
INTRAVENOUS | Status: DC | PRN
  Administered 2017-06-15: 60 ug/kg/min via INTRAVENOUS

## 2017-06-15 MED ORDER — SODIUM CHLORIDE 0.9 % IV SOLN
INTRAVENOUS | Status: DC | PRN
  Administered 2017-06-15: 07:00:00 via INTRAVENOUS

## 2017-06-15 MED ORDER — KETAMINE HCL 50 MG/ML IJ SOLN
INTRAMUSCULAR | Status: AC
  Filled 2017-06-15: qty 10

## 2017-06-15 MED ORDER — PROPOFOL 500 MG/50ML IV EMUL
INTRAVENOUS | Status: AC
  Filled 2017-06-15: qty 50

## 2017-06-15 MED ORDER — METOCLOPRAMIDE HCL 5 MG/ML IJ SOLN: 5.0000 mg | Freq: Three times a day (TID) | INTRAMUSCULAR | Status: DC | PRN

## 2017-06-15 MED ORDER — IOPAMIDOL (ISOVUE-M 200) INJECTION 41%
INTRAMUSCULAR | Status: DC | PRN
  Administered 2017-06-15: 20 mL

## 2017-06-15 MED ORDER — PROMETHAZINE HCL 25 MG/ML IJ SOLN: 6.2500 mg | INTRAMUSCULAR | Status: DC | PRN

## 2017-06-15 MED ORDER — GABAPENTIN 100 MG PO CAPS: 100.0000 mg | ORAL_CAPSULE | Freq: Two times a day (BID) | ORAL | 0 refills | Status: DC

## 2017-06-15 MED ORDER — LOSARTAN POTASSIUM 100 MG PO TABS: 100.0000 mg | ORAL_TABLET | Freq: Every day | ORAL | 0 refills | Status: DC

## 2017-06-15 MED ORDER — OXYCODONE HCL 5 MG/5ML PO SOLN: 5.0000 mg | Freq: Once | ORAL | Status: AC | PRN

## 2017-06-15 MED ORDER — OXYCODONE HCL 5 MG PO TABS
ORAL_TABLET | ORAL | Status: AC
  Administered 2017-06-15: 5 mg via ORAL
  Filled 2017-06-15: qty 1

## 2017-06-15 MED ORDER — ONDANSETRON HCL 4 MG PO TABS: 4.0000 mg | ORAL_TABLET | Freq: Four times a day (QID) | ORAL | Status: DC | PRN

## 2017-06-15 MED ORDER — LIDOCAINE HCL 1 % IJ SOLN
INTRAMUSCULAR | Status: DC | PRN
  Administered 2017-06-15: 20 mL

## 2017-06-15 MED ORDER — FENTANYL CITRATE (PF) 100 MCG/2ML IJ SOLN
INTRAMUSCULAR | Status: AC
  Filled 2017-06-15: qty 2

## 2017-06-15 MED ORDER — METFORMIN HCL ER 500 MG PO TB24: 500.0000 mg | ORAL_TABLET | Freq: Every day | ORAL | 0 refills | Status: DC

## 2017-06-15 MED ORDER — ENOXAPARIN SODIUM 40 MG/0.4ML ~~LOC~~ SOLN: 40.0000 mg | SUBCUTANEOUS | Status: DC

## 2017-06-15 MED ORDER — BISACODYL 10 MG RE SUPP
10.0000 mg | Freq: Once | RECTAL | Status: AC
  Administered 2017-06-15: 10 mg via RECTAL
  Filled 2017-06-15: qty 1

## 2017-06-15 MED ORDER — DIAZEPAM 2 MG PO TABS: 2.0000 mg | ORAL_TABLET | Freq: Two times a day (BID) | ORAL | 0 refills | Status: DC | PRN

## 2017-06-15 MED ORDER — PROPOFOL 10 MG/ML IV BOLUS
INTRAVENOUS | Status: DC | PRN
  Administered 2017-06-15: 40 mg via INTRAVENOUS
  Administered 2017-06-15: 20 mg via INTRAVENOUS

## 2017-06-15 SURGICAL SUPPLY — 17 items
ADH SKN CLS APL DERMABOND .7 (GAUZE/BANDAGES/DRESSINGS) ×1
CEMENT KYPHON CX01A KIT/MIXER (Cement) ×3 IMPLANT
DERMABOND ADVANCED (GAUZE/BANDAGES/DRESSINGS) ×2
DERMABOND ADVANCED .7 DNX12 (GAUZE/BANDAGES/DRESSINGS) ×1 IMPLANT
DEVICE BIOPSY BONE KYPHX (INSTRUMENTS) ×3 IMPLANT
DRAPE C-ARM XRAY 36X54 (DRAPES) ×3 IMPLANT
DURAPREP 26ML APPLICATOR (WOUND CARE) ×3 IMPLANT
GLOVE SURG SYN 9.0  PF PI (GLOVE) ×2
GLOVE SURG SYN 9.0 PF PI (GLOVE) ×1 IMPLANT
GOWN SRG 2XL LVL 4 RGLN SLV (GOWNS) ×1 IMPLANT
GOWN STRL NON-REIN 2XL LVL4 (GOWNS) ×3
GOWN STRL REUS W/ TWL LRG LVL3 (GOWN DISPOSABLE) ×1 IMPLANT
GOWN STRL REUS W/TWL LRG LVL3 (GOWN DISPOSABLE) ×3
PACK KYPHOPLASTY (MISCELLANEOUS) ×3 IMPLANT
STRAP SAFETY 5IN WIDE (MISCELLANEOUS) ×3 IMPLANT
TRAY KYPHOPAK 15/3 EXPRESS 1ST (MISCELLANEOUS) ×3 IMPLANT
TRAY KYPHOPAK 20/3 EXPRESS 1ST (MISCELLANEOUS) ×1 IMPLANT

## 2017-06-15 NOTE — Anesthesia Procedure Notes (Signed)
Date/Time: 06/15/2017 7:57 AM Performed by: Nelda Marseille, CRNA Pre-anesthesia Checklist: Patient identified, Emergency Drugs available, Suction available, Patient being monitored and Timeout performed Oxygen Delivery Method: Nasal cannula

## 2017-06-15 NOTE — Clinical Social Work Placement (Signed)
   CLINICAL SOCIAL WORK PLACEMENT  NOTE  Date:  06/15/2017  Patient Details  Name: Jaclyn Peterson MRN: [id number] Date of Birth: 03-Jan-1941  Clinical Social Work is seeking post-discharge placement for this patient at the [place] level of care (*CSW will initial, date and re-position this form in  chart as items are completed):  Yes   Patient/family provided with [facility] Work Department's list of facilities offering this level of care within the geographic area requested by the patient (or if unable, by the patient's family).  Yes   Patient/family informed of their freedom to choose among providers that offer the needed level of care, that participate in Medicare, Medicaid or managed care program needed by the patient, have an available bed and are willing to accept the patient.  Yes   Patient/family informed of [facility] ownership interest in [facility] and [facility], as well as of the fact that they are under no obligation to receive care at these facilities.  PASRR submitted to EDS on       PASRR number received on       Existing PASRR number confirmed on 06/13/17     FL2 transmitted to all facilities in geographic area requested by pt/family on 06/13/17     FL2 transmitted to all facilities within larger geographic area on       Patient informed that his/her managed care company has contracts with or will negotiate with certain facilities, including the following:        Yes   Patient/family informed of bed offers received.  Patient chooses bed at [facility] )     Physician recommends and patient chooses bed at      Patient to be transferred to [facility] ) on 06/15/17.  Patient to be transferred to facility by [facility] )     Patient family notified on 06/15/17 of transfer.  Name of family member notified:  (Patient's daughter [name] was at bedside and aware of D/C today. )      PHYSICIAN       Additional Comment:    _______________________________________________ Ryszard Socarras, Veronia Beets, LCSW 06/15/2017, 12:03 PM

## 2017-06-15 NOTE — Op Note (Signed)
06/15/2017  8:06 AM  PATIENT:  Jaclyn Peterson  77 y.o. female  PRE-OPERATIVE DIAGNOSIS:  t12 fracture  POST-OPERATIVE DIAGNOSIS:  t12 fracture  PROCEDURE:  Procedure(s): [id number] (N/A)  SURGEON: Laurene Footman, MD  ASSISTANTS: none  ANESTHESIA:   local and MAC  EBL:  No intake/output data recorded.  BLOOD ADMINISTERED:none  DRAINS: none   LOCAL MEDICATIONS USED:  MARCAINE    and XYLOCAINE   SPECIMEN:  Source of Specimen:  T 11 vertebral body  DISPOSITION OF SPECIMEN:  PATHOLOGY  COUNTS:  YES  TOURNIQUET:  * No tourniquets in log *  IMPLANTS: bone cement  DICTATION: .Dragon Dictation patient was brought to the operating room and after adequate sedation was obtained the patient was placed prone.  Serum was brought in both AP and lateral projections and good visualization of the fracture T11 was obtained.  After patient identification and timeout procedure were completed 10 cc of 1% Xylocaine was infiltrated on the right side at the area of the planned incision.  The back was then prepped and draped in usual sterile fashion and repeat timeout procedure carried out.  Spinal needle was brought down to the pedicle on the right at T11 and a 50-50 mix of 1% Xylocaine half percent Sensorcaine with epinephrine a total of 40 cc injected along the path to the skin.  After allowing this to set a small incision was made and a trocar was advanced in an extrapedicular fashion into the vertebral body where a biopsy was obtained.  Drilling was carried out followed by placement of balloon and inflation to 3 cc.  When the cement was the appropriate consistency the balloon was let down and 4-1/2 cc of bone cement was used to infiltrate the body and get very good fill interdigitation and fill from medial to lateral on both sides as well as superior to inferior endplates.  When the cement was set trochars removed and the wound closed with Dermabond followed by Band-Aid  PLAN OF CARE:  Discharge to home after PACU  PATIENT DISPOSITION:  PACU - hemodynamically stable.

## 2017-06-15 NOTE — Transfer of Care (Signed)
Immediate Anesthesia Transfer of Care Note  Patient: Jaclyn Peterson  Procedure(s) Performed: Coralyn Helling (N/A Spine Thoracic)  Patient Location: PACU  Anesthesia Type:General  Level of Consciousness: sedated  Airway & Oxygen Therapy: Patient connected to nasal cannula oxygen  Post-op Assessment: Report given to RN and Post -op Vital signs reviewed and stable  Post vital signs: Reviewed and stable  Last Vitals:  Vitals Value Taken Time  BP    Temp    Pulse 32 06/15/2017  8:09 AM  Resp 20 06/15/2017  8:09 AM  SpO2 99 % 06/15/2017  8:09 AM  Vitals shown include unvalidated device data.  Last Pain:  Vitals:   06/14/17 2324  TempSrc:   PainSc: 2       Patients Stated Pain Goal: 1 (76/72/09 4709)  Complications: No apparent anesthesia complications

## 2017-06-15 NOTE — Progress Notes (Signed)
Patient is medically stable for discharge to [facility] today. Magda Paganini, admission coordinator at [facility], stated that patient can come to Rm 412. CSW sent discharge orders via [place] [facility]. Patient and patient's daughter [name] are aware of above. RN to call report and arrange for EMS to transport after patient works with PT. Please reconsult if future social work needs arises. CSW and social work Architectural technologist off.  Susy Frizzle, Social Work Intern [phone]

## 2017-06-15 NOTE — Anesthesia Preprocedure Evaluation (Signed)
Anesthesia Evaluation  Patient identified by MRN, date of birth, ID band Patient awake    Reviewed: Allergy & Precautions, NPO status , Patient's Chart, lab work & pertinent test results  History of Anesthesia Complications Negative for: history of anesthetic complications  Airway Mallampati: III  TM Distance: >3 FB Neck ROM: Full    Dental  (+) Edentulous Upper, Edentulous Lower   Pulmonary neg sleep apnea, neg COPD, Current Smoker,    breath sounds clear to auscultation- rhonchi (-) wheezing      Cardiovascular hypertension, Pt. on medications (-) CAD, (-) Past MI, (-) Cardiac Stents and (-) CABG  Rhythm:Regular Rate:Normal - Systolic murmurs and - Diastolic murmurs    Neuro/Psych  Headaches, PSYCHIATRIC DISORDERS Depression    GI/Hepatic negative GI ROS, Neg liver ROS,   Endo/Other  diabetes, Oral Hypoglycemic Agents  Renal/GU negative Renal ROS     Musculoskeletal negative musculoskeletal ROS (+)   Abdominal (+) - obese,   Peds  Hematology negative hematology ROS (+)   Anesthesia Other Findings Past Medical History: No date: Celiac disease No date: Chronic pain No date: Depression No date: Diabetes mellitus No date: Falls frequently     Comment:  Falls/pneumonia 1/12 No date: Hyperlipidemia No date: Hypertension No date: Migraines No date: Neuropathy No date: Osteoporosis No date: Pericarditis No date: Pneumonia No date: SBO (small bowel obstruction) (HCC)     Comment:  obstipation  10/2005 No date: Uterine cancer ([place])     Comment:  P32 INSERT No date: Vertigo   Reproductive/Obstetrics                             Anesthesia Physical Anesthesia Plan  ASA: III  Anesthesia Plan: General   Post-op Pain Management:    Induction: Intravenous  PONV Risk Score and Plan: 1 and Propofol infusion and Ondansetron  Airway Management Planned: Natural Airway  Additional  Equipment:   Intra-op Plan:   Post-operative Plan:   Informed Consent: I have reviewed the patients History and Physical, chart, labs and discussed the procedure including the risks, benefits and alternatives for the proposed anesthesia with the patient or authorized representative who has indicated his/her understanding and acceptance.   Dental advisory given  Plan Discussed with: CRNA and Anesthesiologist  Anesthesia Plan Comments:         Anesthesia Quick Evaluation

## 2017-06-15 NOTE — Anesthesia Post-op Follow-up Note (Signed)
Anesthesia QCDR form completed.        

## 2017-06-15 NOTE — Care Management Obs Status (Signed)
[place] NOTIFICATION   Patient Details  Name: BESSY REANEY MRN: [id number] Date of Birth: Mar 31, 1940   Medicare Observation Status Notification Given:  Yes    Shelbie Ammons, RN 06/15/2017, 1:59 PM

## 2017-06-15 NOTE — Discharge Summary (Signed)
[facility] NAME: Jaclyn Peterson    MR#:  [id number]  DATE OF BIRTH:  1940-07-07  DATE OF ADMISSION:  06/12/2017 ADMITTING PHYSICIAN: Vaughan Basta, MD  DATE OF DISCHARGE: 06/15/2017  PRIMARY CARE PHYSICIAN: Venia Carbon, MD    ADMISSION DIAGNOSIS:  Confusion [R41.0] Acute cystitis without hematuria [N30.00] Fall at home, subsequent encounter [W19.XXXD, H54.562]  DISCHARGE DIAGNOSIS:  Principal Problem:   Confusion Active Problems:   Altered mental status   T12 vertebral fracture ([place])   SECONDARY DIAGNOSIS:   Past Medical History:  Diagnosis Date  . Celiac disease   . Chronic pain   . Depression   . Diabetes mellitus   . Falls frequently    Falls/pneumonia 1/12  . Hyperlipidemia   . Hypertension   . Migraines   . Neuropathy   . Osteoporosis   . Pericarditis   . Pneumonia   . SBO (small bowel obstruction) (H[facility])    obstipation  10/2005  . Uterine cancer ([place])    P32 INSERT  . Vertigo     HOSPITAL COURSE:   1 acute on chronic worsening T12 vertebral body fracture  acute on chronic back pain noted Orthopedic surgery input appreciated- Kyphoplasty done.  2 acute toxic metabolic encephalopathy Resolved Secondary to polypharmacy Continue to avoid psychotropic meds, MRI of the brain noted for moderate to severe atrophy, ammonia level was negative, urine drug screen noted for benzodiazepines/opioids/try cyclic's, continue neurochecks per routine, aspiration/fall precautions, physical therapy input appreciated-recommending skilled nursing facility placement, neurology input appreciated Stop prozac, cymbalta on d/c.  pain meds ONLY AS NEEDED and stop once improve.  3acute fall Most likely secondary to polypharmacy  continue fall precautions  4chronic diabetes mellitus type 2 Stable on current regiment  5acute possible UTI Continue empiric Rocephin for 3-day course and  follow-up on cultures  6 AKI Resolved with IV fluids for rehydration Secondary to dehydration Continue to avoid nephrotoxic agents     DISCHARGE CONDITIONS:   Stable.  CONSULTS OBTAINED:  Treatment Team:  Alexis Goodell, MD  DRUG ALLERGIES:   Allergies  Allergen Reactions  . Aspirin     Able to take baby aspirin  . Bisoprolol Fumarate     REACTION: ha  . Chlorthalidone   . Lisinopril     REACTION: throat symptoms  . Sulfa Antibiotics     Told by mother as a child  . Tetracyclines & Related Swelling    Throat swelling    DISCHARGE MEDICATIONS:   Allergies as of 06/15/2017      Reactions   Aspirin    Able to take baby aspirin   Bisoprolol Fumarate    REACTION: ha   Chlorthalidone    Lisinopril    REACTION: throat symptoms   Sulfa Antibiotics    Told by mother as a child   Tetracyclines & Related Swelling   Throat swelling      Medication List    STOP taking these medications   dicyclomine 20 MG tablet Commonly known as:  BENTYL   DULoxetine 60 MG capsule Commonly known as:  CYMBALTA   FLUoxetine 40 MG capsule Commonly known as:  PROZAC   JANUVIA 100 MG tablet Generic drug:  sitaGLIPtin   tiZANidine 2 MG tablet Commonly known as:  ZANAFLEX   traMADol 50 MG tablet Commonly known as:  ULTRAM     TAKE these medications   ACCU-CHEK AVIVA PLUS test strip Generic drug:  glucose blood USE  AS DIRECTED   ACCU-CHEK AVIVA PLUS w/Device Kit TEST BLOOD SUGAR DAILY AS DIRECTED   accu-chek soft touch lancets Use as instructed.  Dx code:  E11.40   acetaminophen 500 MG tablet Commonly known as:  TYLENOL Take 1 tablet (500 mg total) by mouth 3 (three) times daily.   amLODipine 10 MG tablet Commonly known as:  NORVASC TAKE 1 TABLET(10 MG) BY MOUTH DAILY   aspirin 81 MG tablet Take 81 mg by mouth daily.   cholestyramine 4 g packet Commonly known as:  QUESTRAN MIX AND DRINK 1 PACKET BY MOUTH DAILY AS DIRECTED   diazepam 2 MG  tablet Commonly known as:  VALIUM Take 1 tablet (2 mg total) by mouth every 12 (twelve) hours as needed for anxiety. What changed:    how much to take  how to take this  when to take this  reasons to take this  additional instructions   docusate sodium 100 MG capsule Commonly known as:  COLACE Take 1 capsule (100 mg total) by mouth 2 (two) times daily.   gabapentin 100 MG capsule Commonly known as:  NEURONTIN Take 1 capsule (100 mg total) by mouth 2 (two) times daily.   glipiZIDE 5 MG tablet Commonly known as:  GLUCOTROL TAKE 1 TABLET BY MOUTH TWICE DAILY BEFORE A MEAL   lactulose 10 GM/15ML solution Commonly known as:  CHRONULAC Take 30 mLs (20 g total) by mouth daily as needed for mild constipation.   losartan 100 MG tablet Commonly known as:  COZAAR Take 1 tablet (100 mg total) by mouth daily. Start taking on:  06/16/2017 What changed:    medication strength  See the new instructions.   meclizine 25 MG tablet Commonly known as:  ANTIVERT TAKE 1 TABLET(25 MG) BY MOUTH THREE TIMES DAILY AS NEEDED FOR VERTIGO   metFORMIN 500 MG 24 hr tablet Commonly known as:  GLUCOPHAGE-XR Take 1 tablet (500 mg total) by mouth daily with breakfast. What changed:  See the new instructions.   ondansetron 4 MG disintegrating tablet Commonly known as:  ZOFRAN ODT Take 1 tablet (4 mg total) by mouth every 8 (eight) hours as needed for nausea or vomiting.   oxyCODONE 5 MG immediate release tablet Commonly known as:  Oxy IR/ROXICODONE Take 1 tablet (5 mg total) by mouth every 4 (four) hours as needed for moderate pain or severe pain.   propranolol 20 MG tablet Commonly known as:  INDERAL Take 1 tablet (20 mg total) by mouth 2 (two) times daily.   simvastatin 20 MG tablet Commonly known as:  ZOCOR TAKE 1 TABLET(20 MG) BY MOUTH DAILY        DISCHARGE INSTRUCTIONS:    Follow with PMD in 2 week.  If you experience worsening of your admission symptoms, develop shortness  of breath, life threatening emergency, suicidal or homicidal thoughts you must seek medical attention immediately by calling 911 or calling your MD immediately  if symptoms less severe.  You Must read complete instructions/literature along with all the possible adverse reactions/side effects for all the Medicines you take and that have been prescribed to you. Take any new Medicines after you have completely understood and accept all the possible adverse reactions/side effects.   Please note  You were cared for by a hospitalist during your hospital stay. If you have any questions about your discharge medications or the care you received while you were in the hospital after you are discharged, you can call the unit and asked to speak  with the hospitalist on call if the hospitalist that took care of you is not available. Once you are discharged, your primary care physician will handle any further medical issues. Please note that NO REFILLS for any discharge medications will be authorized once you are discharged, as it is imperative that you return to your primary care physician (or establish a relationship with a primary care physician if you do not have one) for your aftercare needs so that they can reassess your need for medications and monitor your lab values.    Today   CHIEF COMPLAINT:   Chief Complaint  Patient presents with  . Back Pain    HISTORY OF PRESENT ILLNESS:  Jaclyn Peterson  is a 77 y.o. female with a known history of diabetes, hyperlipidemia, hypertension, migraine, small bowel obstruction, uterine cancer- lives with her daughter,walks with minimal support, no memory issues at baseline. For last 1 week has worsening in functional status and had a fall last week. She came to emergency room, UA was negative and she was noted to have a compression fracture on vertebrae, sent home with pain medications.  She continued to have pain, decreased oral appetite, decreased movements,  and more confused- concerned with this, daughter brought her to emergency room today. She was noted to have no clear reason, as per initial workup but because of significant decline in overall status in last 1 week, suggested to observe in hospital for further workup.   VITAL SIGNS:  Blood pressure (!) 194/74, pulse 67, temperature 98.3 F (36.8 C), temperature source Oral, resp. rate 18, height 5' 5"  (1.651 m), weight 69.4 kg (153 lb), SpO2 96 %.  I/O:    Intake/Output Summary (Last 24 hours) at 06/15/2017 1028 Last data filed at 06/15/2017 0928 Gross per 24 hour  Intake 580 ml  Output 1347 ml  Net -767 ml    PHYSICAL EXAMINATION:   GENERAL:  77 y.o.-year-old patient lying in the bed with no acute distress.  EYES: Pupils equal, round, reactive to light and accommodation. No scleral icterus. Extraocular muscles intact.  HEENT: Head atraumatic, normocephalic. Oropharynx and nasopharynx clear.  NECK:  Supple, no jugular venous distention. No thyroid enlargement, no tenderness.  LUNGS: Normal breath sounds bilaterally, no wheezing, rales,rhonchi or crepitation. No use of accessory muscles of respiration.  CARDIOVASCULAR: S1, S2 normal. No murmurs, rubs, or gallops.  ABDOMEN: Soft, nontender, nondistended. Bowel sounds present. No organomegaly or mass.  EXTREMITIES: No pedal edema, cyanosis, or clubbing.  NEUROLOGIC: Cranial nerves II through XII are intact. Muscle strength 4/5 in all extremities. Sensation intact. Gait not checked.  PSYCHIATRIC: The patient is alert and oriented x 3.  SKIN: No obvious rash, lesion, or ulcer.     DATA REVIEW:   CBC Recent Labs  Lab 06/14/17 0636  WBC 10.7  HGB 13.1  HCT 39.0  PLT 420    Chemistries  Recent Labs  Lab 06/12/17 1441 06/13/17 0518  NA 137 136  K 3.9 3.4*  CL 107 107  CO2 13* 17*  GLUCOSE 144* 156*  BUN 29* 25*  CREATININE 1.13* 0.86  CALCIUM 8.9 8.7*  AST 23  --   ALT 29  --   ALKPHOS 138*  --   BILITOT 1.0  --      Cardiac Enzymes Recent Labs  Lab 06/12/17 1441  TROPONINI 0.04*    Microbiology Results  Results for orders placed or performed during the hospital encounter of 03/28/17  Urine Culture     Status:  Abnormal   Collection Time: 03/28/17  7:09 AM  Result Value Ref Range Status   Specimen Description   Final    URINE, RANDOM Performed at [facility], [address] [place], [place] [address]    Special Requests   Final    NONE Performed at [facility], [place],  [address]    Culture >=100,000 COLONIES/mL ESCHERICHIA COLI (A)  Final   Report Status 03/30/2017 FINAL  Final   Organism ID, Bacteria ESCHERICHIA COLI (A)  Final      Susceptibility   Escherichia coli - MIC*    AMPICILLIN 4 SENSITIVE Sensitive     CEFAZOLIN <=4 SENSITIVE Sensitive     CEFTRIAXONE <=1 SENSITIVE Sensitive     CIPROFLOXACIN >=4 RESISTANT Resistant     GENTAMICIN <=1 SENSITIVE Sensitive     IMIPENEM <=0.25 SENSITIVE Sensitive     NITROFURANTOIN <=16 SENSITIVE Sensitive     TRIMETH/SULFA <=20 SENSITIVE Sensitive     AMPICILLIN/SULBACTAM <=2 SENSITIVE Sensitive     PIP/TAZO <=4 SENSITIVE Sensitive     Extended ESBL NEGATIVE Sensitive     * >=100,000 COLONIES/mL ESCHERICHIA COLI    RADIOLOGY:  Dg Thoracic Spine 2 View  Result Date: 06/15/2017 CLINICAL DATA:  Kyphoplasty at T12 EXAM: THORACIC SPINE 2 VIEWS; DG C-ARM 61-120 MIN COMPARISON:  None. FLUOROSCOPY TIME:  1 minutes 31 seconds; 30.6 mGy; 2 acquired images FINDINGS: Frontal and lateral views obtained. The patient is status post kyphoplasty procedure at T12. There is no significant contrast extravasation. There remains mild endplate concavity at H47 superiorly and inferiorly. No other fracture evident. No spondylolisthesis. IMPRESSION: Status post kyphoplasty procedure at T12 without appreciable contrast extravasation. Endplate concavity at M54. Surrounding vertebral bodies appear unremarkable. No  spondylolisthesis. Electronically Signed   By: Lowella Grip III M.D.   On: 06/15/2017 09:33   Dg C-arm 1-60 Min  Result Date: 06/15/2017 CLINICAL DATA:  Kyphoplasty at T12 EXAM: THORACIC SPINE 2 VIEWS; DG C-ARM 61-120 MIN COMPARISON:  None. FLUOROSCOPY TIME:  1 minutes 31 seconds; 30.6 mGy; 2 acquired images FINDINGS: Frontal and lateral views obtained. The patient is status post kyphoplasty procedure at T12. There is no significant contrast extravasation. There remains mild endplate concavity at Y50 superiorly and inferiorly. No other fracture evident. No spondylolisthesis. IMPRESSION: Status post kyphoplasty procedure at T12 without appreciable contrast extravasation. Endplate concavity at P54. Surrounding vertebral bodies appear unremarkable. No spondylolisthesis. Electronically Signed   By: Lowella Grip III M.D.   On: 06/15/2017 09:33    EKG:   Orders placed or performed during the hospital encounter of 06/12/17  . ED EKG  . ED EKG      Management plans discussed with the patient, family and they are in agreement.  CODE STATUS:     Code Status Orders  (From admission, onward)        Start     Ordered   06/12/17 1834  Full code  Continuous     06/12/17 1833    Code Status History    This patient has a current code status but no historical code status.      TOTAL TIME TAKING CARE OF THIS PATIENT: 35 minutes.    Vaughan Basta M.D on 06/15/2017 at 10:28 AM  Between 7am to 6pm - Pager - [phone]  After 6pm go to [email or web address] - password [email or web address] [facility]  Office  [phone]  CC: Primary care physician; Venia Carbon, MD  Note: This dictation was prepared with Dragon dictation along with smaller phrase technology. Any transcriptional errors that result from this process are unintentional.

## 2017-06-15 NOTE — Progress Notes (Signed)
Patient left for OR w/o antibiotic as it was not available on the unit to send.

## 2017-06-15 NOTE — Progress Notes (Signed)
Foley removed per MD order.  

## 2017-06-15 NOTE — Anesthesia Postprocedure Evaluation (Signed)
Anesthesia Post Note  Patient: [name]  Procedure(s) Performed: Coralyn Helling (N/A Spine Thoracic)  Patient location during evaluation: PACU Anesthesia Type: General Level of consciousness: awake and alert and oriented Pain management: pain level controlled Vital Signs Assessment: post-procedure vital signs reviewed and stable Respiratory status: spontaneous breathing, nonlabored ventilation and respiratory function stable Cardiovascular status: blood pressure returned to baseline and stable Postop Assessment: no signs of nausea or vomiting Anesthetic complications: no     Last Vitals:  Vitals:   06/15/17 0855 06/15/17 0900  BP: (!) 181/71   Pulse: 71 73  Resp: 14 19  Temp:    SpO2: 95% 95%    Last Pain:  Vitals:   06/15/17 0900  TempSrc:   PainSc: 6                  Morio Widen

## 2017-06-15 NOTE — Evaluation (Signed)
Physical Therapy Re-evaluation Patient Details Name: FABIOLA MUDGETT MRN: [id number] DOB: 1940-04-27 Today's Date: 06/15/2017   History of Present Illness  Pt is a 77 year old female admitted from home for confusion and T12 compression fracture following a fall.  Pt has a significant fall hx.  PMH also includes vertigo, uterine CA, small bowel obstruction, pneumonia, pericarditis, osteoporosis, DM, depression, chronic pain.  Clinical Impression  Pt is a 77 year old female who lives alone.  She is in bed upon PT arrival and reports no pain.  Pt appears to be very giddy following surgery and demonstrates some confusion with situation but is able to follow directions.  Pt requires min A to perform bed mobility with the exception of performing sit to supine when getting back into bed.  Pt able to STS with handheld assist from PT and perform standing balance and side stepping along counter with bilateral UE support.  Pt able to perform forward walking for approximately 10 ft, close CGA.  PT issued and reviewed an HEP and educated pt and daughter concerning importance of frequency of there ex during recovery process.  Pt expressed understanding.  Pt will continue to benefit from skilled PT with focus on strength, tolerance to activity, balance, pain management, functional mobility and safe use of AD.    Follow Up Recommendations SNF    Equipment Recommendations       Recommendations for Other Services       Precautions / Restrictions Precautions Precautions: Fall;Back Precaution Booklet Issued: No Precaution Comments: High fall risk, compression fracture T12 Restrictions Weight Bearing Restrictions: No      Mobility  Bed Mobility Overal bed mobility: Needs Assistance Bed Mobility: Rolling Rolling: Min assist   Supine to sit: Min assist;HOB elevated Sit to supine: Min assist;HOB elevated   General bed mobility comments: Uses bed rail to roll and requires manual assist for supine  to sit.  Able to perform sit to supine without assistance.  Transfers Overall transfer level: Needs assistance Equipment used: None Transfers: Sit to/from Stand Sit to Stand: Min assist         General transfer comment: Pt able to stand with min A from PT and perform standing balance holding counter.    Ambulation/Gait Ambulation/Gait assistance: Min guard Ambulation Distance (Feet): 10 Feet Assistive device: 1 person hand held assist     Gait velocity interpretation: Below normal speed for age/gender General Gait Details: Pt able to side step and walk forward along counter with very close CGA and VC's from PT for turning, etc.  Stairs            Wheelchair Mobility    Modified Rankin (Stroke Patients Only)       Balance Overall balance assessment: Needs assistance Sitting-balance support: Bilateral upper extremity supported;Feet supported       Standing balance support: Bilateral upper extremity supported   Standing balance comment: Able to stand at counter for 1-2 min                             Pertinent Vitals/Pain Pain Assessment: No/denies pain    Home Living Family/patient expects to be discharged to:: Private residence Living Arrangements: Children Available Help at Discharge: Family;Available PRN/intermittently Type of Home: House(Work during the day.) Home Access: Stairs to enter   Entrance Stairs-Number of Steps: 1, children help her to navigate stairs  Home Layout: One level Home Equipment: [place] - 2 wheels  Prior Function Level of Independence: Independent with assistive device(s)         Comments: does not drive due to pain in LE's and back.  Children help with groceries.     Hand Dominance   Dominant Hand: Right    Extremity/Trunk Assessment   Upper Extremity Assessment Upper Extremity Assessment: Generalized weakness    Lower Extremity Assessment Lower Extremity Assessment: Generalized weakness(Reports  no sensation loss in BLE.)    Cervical / Trunk Assessment Cervical / Trunk Assessment: Kyphotic  Communication   Communication: No difficulties  Cognition                                              General Comments      Exercises Other Exercises Other Exercises: PT issued and reviewed proper use of HEP for core strength.  Pt's daughter expressed understanding.   Assessment/Plan    PT Assessment Patient needs continued PT services  PT Problem List Decreased strength;Decreased mobility;Decreased safety awareness;Decreased range of motion;Decreased coordination;Decreased activity tolerance;Decreased balance;Decreased knowledge of use of DME;Decreased cognition       PT Treatment Interventions DME instruction;Therapeutic activities;Gait training;Therapeutic exercise;Cognitive remediation;Patient/family education;Stair training;Balance training;Functional mobility training;Neuromuscular re-education;Manual techniques    PT Goals (Current goals can be found in the Care Plan section)  Acute Rehab PT Goals Patient Stated Goal: To eventually return home and continue to be generally active. Potential to Achieve Goals: Good    Frequency Min 2X/week   Barriers to discharge        Co-evaluation               AM-PAC PT "6 Clicks" Daily Activity  Outcome Measure Difficulty turning over in bed (including adjusting bedclothes, sheets and blankets)?: A Little Difficulty moving from lying on back to sitting on the side of the bed? : A Little Difficulty sitting down on and standing up from a chair with arms (e.g., wheelchair, bedside commode, etc,.)?: A Little Help needed moving to and from a bed to chair (including a wheelchair)?: A Little Help needed walking in hospital room?: A Lot Help needed climbing 3-5 steps with a railing? : A Lot 6 Click Score: 16    End of Session Equipment Utilized During Treatment: Gait belt Activity Tolerance: Patient tolerated  treatment well Patient left: in bed;with bed alarm set;with family/visitor present;with call bell/phone within reach   PT Visit Diagnosis: Unsteadiness on feet (R26.81);Muscle weakness (generalized) (M62.81)    Time: 1405-1430 PT Time Calculation (min) (ACUTE ONLY): 25 min   Charges:   PT Evaluation $PT Re-evaluation: 1 Re-eval PT Treatments $Therapeutic Activity: 8-22 mins   PT G Codes:   PT G-Codes **NOT FOR INPATIENT CLASS** Functional Assessment Tool Used: AM-PAC 6 Clicks Basic Mobility    Roxanne Gates, PT, DPT   Roxanne Gates 06/15/2017, 2:30 PM

## 2017-06-15 NOTE — Progress Notes (Addendum)
EMS called for transport. Awaiting on RN to call back from [facility] to take report. IV's removed.    **Report given to Levada Dy, Therapist, sports at [facility]

## 2017-06-16 ENCOUNTER — Encounter: Payer: Self-pay | Admitting: Orthopedic Surgery

## 2017-06-16 LAB — SURGICAL PATHOLOGY

## 2017-06-19 ENCOUNTER — Telehealth: Payer: Self-pay

## 2017-06-19 NOTE — Telephone Encounter (Signed)
Called to follow up with patient. LM at her home number and at her daughter's, [name] (Emergency Contact) number to have her call with an update on her mother.   Appears from the discharge summary, patient has been discharged to [facility] but would like to verify this for Dr. Silvio Pate.    Patient does appear to have a hosp f/u visit scheduled with Dr. Silvio Pate on 06/28/17.

## 2017-06-19 NOTE — Telephone Encounter (Signed)
We should cancel that appt unless she has already been discharged from [facility] by that time

## 2017-06-20 NOTE — Telephone Encounter (Signed)
Patient's son should be discussing this with the doctor who is following her at [facility], correct?    I will be out of the office tomorrow.  Please forward this to [facility] to follow up with her son as needed.  Thanks.

## 2017-06-20 NOTE — Telephone Encounter (Signed)
Son, [name] calling regarding mom.  She is at [facility], question on her medication. She is complaining of a lot of pain and they put her back on Oxidocone, this is what she had problem with before .   oxyCODONE (OXY IR/ROXICODONE) 5 MG immediate release   They are questioning this,  Please call John back. [phone]

## 2017-06-21 NOTE — Telephone Encounter (Signed)
Son is on the emergency contact list. Spoke to him.

## 2017-06-21 NOTE — Telephone Encounter (Signed)
Yes--there is nothing I can do while she is at [facility]---he needs to discuss this with the doctor there

## 2017-06-25 ENCOUNTER — Other Ambulatory Visit: Payer: Self-pay | Admitting: Internal Medicine

## 2017-06-27 ENCOUNTER — Telehealth: Payer: Self-pay

## 2017-06-27 NOTE — Telephone Encounter (Signed)
Copied from [place] [phone]. Topic: Quick Communication - See Telephone Encounter >> Jun 27, 2017  2:22 PM Hewitt Shorts wrote: CRM for notification. See Telephone encounter for: 06/27/17.pt daughter christine is calling to talk with Dr. Silvio Pate and her possible sighs of dementa    Best number [phone]

## 2017-06-27 NOTE — Telephone Encounter (Signed)
There is no other TE other than the one I just created from the [facility].

## 2017-06-27 NOTE — Telephone Encounter (Signed)
Pt has no DPR on file. We cannot discuss anything. I advised her daughter that without a DPR or HPOA, I can listen to what she has to say, but I cannot provide any information. I told her what Dr Silvio Pate had said last week about while she is under the care of the doctors at [facility], he has no control of her care. She was saying the pt is suffering from [facility] Dementia. [facility] told her to call Dr Silvio Pate to make her a referral to a neurologist. I explained he has not seen her and has no record of seeing her for that issue, so we would not be able to do a referral. She and her brother have HPOA and will bring the paperwork by for [place]. She will tell [facility] about them having to do the referral.

## 2017-06-28 ENCOUNTER — Inpatient Hospital Stay: Payer: Medicare Other | Admitting: Internal Medicine

## 2017-07-05 ENCOUNTER — Telehealth: Payer: Self-pay | Admitting: Internal Medicine

## 2017-07-05 NOTE — Telephone Encounter (Signed)
Copied from [place] [phone]. Topic: Quick Communication - See Telephone Encounter >> Jul 05, 2017  4:39 PM Rutherford Nail, NT wrote: CRM for notification. See Telephone encounter for: 07/05/17. Christine calling to discuss mother's worsening dementia and pain from compression fracture. Has an appointment with Dr Silvio Pate on 07/21/17. Would like a call to discuss things they could do to help her mother until the appointment. CB#: [phone]

## 2017-07-07 NOTE — Telephone Encounter (Signed)
Why don't you push up her appt to sometime next week so we can review how things are going. I might be willing to increase the tramadol if she is not on valium, and if someone else will monitor her prescriptions

## 2017-07-07 NOTE — Telephone Encounter (Signed)
Spoke to pt's daughter. She asked that I call the pt's son [phone]. Both on DPR. She has been released from rehab. She is having a lot of back pain. Tramadol was changed in the rehab to a lesser amount 42m bid instead of tid. Wanted to know if something can be done before her appt on the 10th about the tramadol to help her pain. 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 Seemed to act different in the rehab then she usually acts. Almost seems to get delirious when she gets in really bad pain. She is not getting up to move around much. SMichela Pitcheryou can call him or daughter if you felt like you needed to before the 07-21-17 appt.

## 2017-07-10 NOTE — Telephone Encounter (Signed)
Jaclyn Peterson, can you help me. The son and daughter are now on the [place]. Can you reach out to them and see about scheduling her this week? The son may be easier. Thanks.

## 2017-07-10 NOTE — Telephone Encounter (Signed)
I spoke to patient's son,John, and he scheduled appointment on 07/14/17.

## 2017-07-14 ENCOUNTER — Encounter: Payer: Self-pay | Admitting: Internal Medicine

## 2017-07-14 ENCOUNTER — Ambulatory Visit: Payer: Medicare Other | Admitting: Internal Medicine

## 2017-07-14 VITALS — BP 138/76 | HR 57 | Temp 98.8°F | Ht 65.0 in | Wt 142.0 lb

## 2017-07-14 DIAGNOSIS — F39 Unspecified mood [affective] disorder: Secondary | ICD-10-CM | POA: Diagnosis not present

## 2017-07-14 DIAGNOSIS — M810 Age-related osteoporosis without current pathological fracture: Secondary | ICD-10-CM

## 2017-07-14 DIAGNOSIS — I10 Essential (primary) hypertension: Secondary | ICD-10-CM

## 2017-07-14 DIAGNOSIS — E1142 Type 2 diabetes mellitus with diabetic polyneuropathy: Secondary | ICD-10-CM | POA: Diagnosis not present

## 2017-07-14 DIAGNOSIS — E441 Mild protein-calorie malnutrition: Secondary | ICD-10-CM

## 2017-07-14 DIAGNOSIS — F0390 Unspecified dementia without behavioral disturbance: Secondary | ICD-10-CM

## 2017-07-14 DIAGNOSIS — S22089G Unspecified fracture of T11-T12 vertebra, subsequent encounter for fracture with delayed healing: Secondary | ICD-10-CM

## 2017-07-14 LAB — LIPID PANEL
CHOLESTEROL: 167 mg/dL (ref 0–200)
HDL: 36.9 mg/dL — AB (ref >39.00)
NonHDL: 130.36
Total CHOL/HDL Ratio: 5
Triglycerides: 277 mg/dL — ABNORMAL HIGH (ref 0.0–149.0)
VLDL: 55.4 mg/dL — AB (ref 0.0–40.0)

## 2017-07-14 LAB — COMPREHENSIVE METABOLIC PANEL
ALBUMIN: 3.6 g/dL (ref 3.5–5.2)
ALK PHOS: 146 U/L — AB (ref 39–117)
ALT: 13 U/L (ref 0–35)
AST: 11 U/L (ref 0–37)
BILIRUBIN TOTAL: 0.2 mg/dL (ref 0.2–1.2)
BUN: 12 mg/dL (ref 6–23)
CALCIUM: 9 mg/dL (ref 8.4–10.5)
CO2: 24 meq/L (ref 19–32)
CREATININE: 0.83 mg/dL (ref 0.40–1.20)
Chloride: 106 mEq/L (ref 96–112)
GFR: 70.56 mL/min (ref >60.00)
Glucose, Bld: 137 mg/dL — ABNORMAL HIGH (ref 70–99)
Potassium: 3.6 mEq/L (ref 3.5–5.1)
Sodium: 140 mEq/L (ref 135–145)
Total Protein: 6.6 g/dL (ref 6.0–8.3)

## 2017-07-14 LAB — VITAMIN B12: Vitamin B-12: 129 pg/mL — ABNORMAL LOW (ref 211–911)

## 2017-07-14 LAB — CBC
HEMATOCRIT: 38.7 % (ref 36.0–46.0)
HEMOGLOBIN: 12.8 g/dL (ref 12.0–15.0)
MCHC: 33 g/dL (ref 30.0–36.0)
MCV: 91.8 fl (ref 78.0–100.0)
PLATELETS: 353 10*3/uL (ref 150.0–400.0)
RBC: 4.22 Mil/uL (ref 3.87–5.11)
RDW: 13.3 % (ref 11.5–15.5)
WBC: 11.8 10*3/uL — AB (ref 4.0–10.5)

## 2017-07-14 LAB — HEMOGLOBIN A1C: Hgb A1c MFr Bld: 6.3 % (ref 4.6–6.5)

## 2017-07-14 LAB — LDL CHOLESTEROL, DIRECT: LDL DIRECT: 93 mg/dL

## 2017-07-14 LAB — T4, FREE: FREE T4: 0.83 ng/dL (ref 0.60–1.60)

## 2017-07-14 MED ORDER — RISEDRONATE SODIUM 150 MG PO TABS: 150.0000 mg | ORAL_TABLET | ORAL | 3 refills | Status: DC

## 2017-07-14 MED ORDER — TRAMADOL HCL 50 MG PO TABS: 50.0000 mg | ORAL_TABLET | Freq: Three times a day (TID) | ORAL | 0 refills | Status: DC | PRN

## 2017-07-14 MED ORDER — FLUOXETINE HCL 20 MG PO TABS: 20.0000 mg | ORAL_TABLET | Freq: Every day | ORAL | 3 refills | Status: DC

## 2017-07-14 NOTE — Assessment & Plan Note (Signed)
Will check labs Avoid psychoactive meds Living with daughter

## 2017-07-14 NOTE — Addendum Note (Signed)
Addended by: Pilar Grammes on: 07/14/2017 12:06 PM   Modules accepted: Orders

## 2017-07-14 NOTE — Assessment & Plan Note (Signed)
Will start actonel Needs mutlivitamin

## 2017-07-14 NOTE — Assessment & Plan Note (Signed)
BP Readings from Last 3 Encounters:  07/14/17 138/76  06/15/17 (!) 173/79  06/09/17 (!) 174/76   okay

## 2017-07-14 NOTE — Progress Notes (Signed)
Subjective:    Patient ID: Jaclyn Peterson, female    DOB: 01/11/1941, 77 y.o.   MRN: [id number]  HPI Here for follow up after hospital stay and rehab With son and daughter  She fell when she was out walking [address] backwards trying to get poncho over her head Fractured T12 Had acute confusion in hospital and in rehab MRI showed "brain atrophy" Had been taken off fluoxetine, duloxetine Had kyphoplasty then out to rehab--- [place] ~10 days ago Daughter/SIL still live with her Assist with showering Bathroom independent--some urine incontinence  Back to Dr Rudene Christians last week Got 2 cortisone shots in her back  Last checked sugar about a week ago--140's Discussed checking 2-3 times a week No hypoglycemia  Current Outpatient Medications on File Prior to Visit  Medication Sig Dispense Refill  . ACCU-CHEK AVIVA PLUS test strip USE AS DIRECTED 100 each 2  . acetaminophen (TYLENOL) 500 MG tablet Take 1 tablet (500 mg total) by mouth 3 (three) times daily. 30 tablet 0  . amLODipine (NORVASC) 10 MG tablet TAKE 1 TABLET(10 MG) BY MOUTH DAILY 90 tablet 3  . aspirin 81 MG tablet Take 81 mg by mouth daily.      . Blood Glucose Monitoring Suppl (ACCU-CHEK AVIVA PLUS) w/Device KIT TEST BLOOD SUGAR DAILY AS DIRECTED 1 kit 0  . cholestyramine (QUESTRAN) 4 g packet MIX AND DRINK 1 PACKET BY MOUTH DAILY AS DIRECTED 60 packet 5  . diazepam (VALIUM) 2 MG tablet Take 1 tablet (2 mg total) by mouth every 12 (twelve) hours as needed for anxiety. 30 tablet 0  . docusate sodium (COLACE) 100 MG capsule Take 1 capsule (100 mg total) by mouth 2 (two) times daily. 10 capsule 0  . DULoxetine (CYMBALTA) 60 MG capsule Take 60 mg by mouth 2 (two) times daily.    Marland Kitchen FLUoxetine (PROZAC) 40 MG capsule Take 40 mg by mouth daily.    Marland Kitchen gabapentin (NEURONTIN) 100 MG capsule Take 1 capsule (100 mg total) by mouth 2 (two) times daily. 60 capsule 0  . glipiZIDE (GLUCOTROL) 5 MG tablet TAKE 1 TABLET BY MOUTH  TWICE DAILY BEFORE A MEAL 180 tablet 3  . lactulose (CHRONULAC) 10 GM/15ML solution Take 30 mLs (20 g total) by mouth daily as needed for mild constipation. 120 mL 0  . Lancets (ACCU-CHEK SOFT TOUCH) lancets Use as instructed.  Dx code:  E11.40 100 each 12  . losartan (COZAAR) 100 MG tablet Take 1 tablet (100 mg total) by mouth daily. 30 tablet 0  . meclizine (ANTIVERT) 25 MG tablet TAKE 1 TABLET(25 MG) BY MOUTH THREE TIMES DAILY AS NEEDED FOR VERTIGO 90 tablet 0  . metFORMIN (GLUCOPHAGE-XR) 500 MG 24 hr tablet Take 1 tablet (500 mg total) by mouth daily with breakfast. 30 tablet 0  . ondansetron (ZOFRAN ODT) 4 MG disintegrating tablet Take 1 tablet (4 mg total) by mouth every 8 (eight) hours as needed for nausea or vomiting. 20 tablet 0  . propranolol (INDERAL) 20 MG tablet Take 1 tablet (20 mg total) by mouth 2 (two) times daily. 180 tablet 1  . simvastatin (ZOCOR) 20 MG tablet TAKE 1 TABLET(20 MG) BY MOUTH DAILY 90 tablet 0  . traMADol (ULTRAM) 50 MG tablet TK 1 T PO BID FOR CHRONIC BACK PAIN  0   No current facility-administered medications on file prior to visit.     Allergies  Allergen Reactions  . Aspirin     Able to take baby  aspirin  . Bisoprolol Fumarate     REACTION: ha  . Chlorthalidone   . Lisinopril     REACTION: throat symptoms  . Sulfa Antibiotics     Told by mother as a child  . Tetracyclines & Related Swelling    Throat swelling    Past Medical History:  Diagnosis Date  . Celiac disease   . Chronic pain   . Depression   . Diabetes mellitus   . Falls frequently    Falls/pneumonia 1/12  . Hyperlipidemia   . Hypertension   . Migraines   . Neuropathy   . Osteoporosis   . Pericarditis   . Pneumonia   . SBO (small bowel obstruction) (HCC)    obstipation  10/2005  . 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 Vertigo     Past Surgical History:  Procedure Laterality Date  . ABDOMINAL HYSTERECTOMY     BSO 1985  cancer  . APPENDECTOMY     1946  Adhesions  shortly after  . BACK SURGERY  1970's  . BLADDER REPAIR     bladder tacking- chronic pain since  1998  . CATARACT EXTRACTION  12/12   left and then laser---Dr Tobe Sos  . CATARACT EXTRACTION W/PHACO Right 06/07/2016   Procedure: CATARACT EXTRACTION PHACO AND INTRAOCULAR LENS PLACEMENT (IOC);  Surgeon: Birder Robson, MD;  Location: [facility] ORS;  Service: Ophthalmology;  Laterality: Right;  [place] 00:43 AP% 20.6 CDE 8.96 Fluid pack lot # [id number]  . CHOLECYSTECTOMY     lysis of adhesions--post op complications, trach, etc  8/10  . KIDNEY STONE SURGERY     cysto-- [facility] 2003  . KYPHOPLASTY N/A 06/15/2017   Procedure: [id number];  Surgeon: Hessie Knows, MD;  Location: [facility] OR[facility];  Service: Orthopedics;  Laterality: N/A;    Family History  Problem Relation Age of Onset  . Coronary artery disease Father   . Hypertension Neg Hx   . Diabetes Neg Hx     Social History   Socioeconomic History  . Marital status: Widowed    Spouse name: Not on file  . Number of children: 2  . Years of education: Not on file  . Highest education level: Not on file  Occupational History  . 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 Financial resource strain: Patient refused  . Food insecurity:    Worry: Patient refused    Inability: Patient refused  . Transportation needs:    Medical: Patient refused    Non-medical: Patient refused  Tobacco Use  . Smoking status: Current Some Day Smoker  . Smokeless tobacco: Never Used  . Tobacco comment: only once in a while. Gave info on 1-800-QUIT NOW  Substance and Sexual Activity  . Alcohol use: No    Alcohol/week: 0.0 oz  . Drug use: No  . Sexual activity: Not Currently  Lifestyle  . Physical activity:    Days per week: Patient refused    Minutes per session: Patient refused  . Stress: Patient refused  Relationships  . Social connections:    Talks on phone: Patient refused    Gets together: Patient refused    Attends  religious service: Patient refused    Active member of club or organization: Patient refused    Attends meetings of clubs or organizations: Patient refused    Relationship status: Patient refused  . Intimate partner violence:    Fear of current or ex partner: Patient refused    Emotionally  abused: Patient refused    Physically abused: Patient refused    Forced sexual activity: Patient refused  Other Topics Concern  . Not on file  Social History Narrative   Enjoys crafts      No living will   Daughter, SIL, kids living with her know   Son [name] and daughter [name] should make health care decisions if POA needed   Would accept resuscitation attempts but no prolonged artificial ventilation   No feeding tubes if cognitively unaware         Review of Systems Still some diarrhea--on the cholestyramine Appetite is poor Has lost 20# through process. Eating better now that she is home but not consistently Sleep is not great Family note some short term memory issues--can't remember getting medication Taking a lot of tylenol PM---asked her to stop this Also taking separate benedryl--counseled on this--same thing Daughter now managing her medications    Objective:   Physical Exam  Constitutional: No distress.  Neck: No thyromegaly present.  Cardiovascular: Normal rate, regular rhythm and normal heart sounds. Exam reveals no gallop.  No murmur heard. Pulmonary/Chest: Effort normal and breath sounds normal. No respiratory distress. She has no wheezes. She has no rales.  Abdominal: Soft. There is no tenderness.  Musculoskeletal:  No spine tenderness  Lymphadenopathy:    She has no cervical adenopathy.  Neurological: She is alert.  No delirium now Some clear memory problems  Skin:  Large dark lesion right thigh--needs derm eval  Psychiatric:  Very anxious          Assessment & Plan:

## 2017-07-14 NOTE — Assessment & Plan Note (Signed)
Chronic anxiety Back on duloxetine and fluoxetine (will decrease the dose)

## 2017-07-14 NOTE — Assessment & Plan Note (Signed)
Still with pain despite the kyphoplasty Will continue tramadol up to tid Can try lidoderm patches (the OTC)

## 2017-07-14 NOTE — Assessment & Plan Note (Signed)
Will recheck her A1c

## 2017-07-15 DIAGNOSIS — E441 Mild protein-calorie malnutrition: Secondary | ICD-10-CM | POA: Insufficient documentation

## 2017-07-15 NOTE — Assessment & Plan Note (Signed)
Significant weight loss related to illness, misuse of meds, etc Hopefully should regain weight back at home

## 2017-07-21 ENCOUNTER — Encounter

## 2017-07-21 ENCOUNTER — Ambulatory Visit: Payer: Medicare Other | Admitting: Internal Medicine

## 2017-07-25 ENCOUNTER — Other Ambulatory Visit: Payer: Self-pay | Admitting: Internal Medicine

## 2017-08-09 ENCOUNTER — Other Ambulatory Visit: Payer: Self-pay | Admitting: Internal Medicine

## 2017-08-09 NOTE — Telephone Encounter (Signed)
Last filled 07-14-17 #90 Last OV 07-14-17 Next OV 08-14-17

## 2017-08-14 ENCOUNTER — Encounter: Payer: Self-pay | Admitting: Internal Medicine

## 2017-08-14 ENCOUNTER — Ambulatory Visit: Payer: Medicare Other | Admitting: Internal Medicine

## 2017-08-14 VITALS — BP 130/72 | HR 59 | Temp 99.1°F | Ht 65.0 in | Wt 138.0 lb

## 2017-08-14 DIAGNOSIS — E1142 Type 2 diabetes mellitus with diabetic polyneuropathy: Secondary | ICD-10-CM | POA: Diagnosis not present

## 2017-08-14 DIAGNOSIS — I10 Essential (primary) hypertension: Secondary | ICD-10-CM | POA: Diagnosis not present

## 2017-08-14 DIAGNOSIS — S22089G Unspecified fracture of T11-T12 vertebra, subsequent encounter for fracture with delayed healing: Secondary | ICD-10-CM | POA: Diagnosis not present

## 2017-08-14 DIAGNOSIS — F39 Unspecified mood [affective] disorder: Secondary | ICD-10-CM

## 2017-08-14 MED ORDER — TIZANIDINE HCL 2 MG PO TABS: 2.0000 mg | ORAL_TABLET | Freq: Two times a day (BID) | ORAL | 0 refills | Status: DC | PRN

## 2017-08-14 MED ORDER — FLUOXETINE HCL 40 MG PO CAPS: 40.0000 mg | ORAL_CAPSULE | Freq: Every day | ORAL | 3 refills | Status: DC

## 2017-08-14 MED ORDER — LOSARTAN POTASSIUM 100 MG PO TABS: 100.0000 mg | ORAL_TABLET | Freq: Every day | ORAL | 3 refills | Status: DC

## 2017-08-14 NOTE — Progress Notes (Signed)
Subjective:    Patient ID: Jaclyn Peterson, female    DOB: Mar 19, 1940, 77 y.o.   MRN: [id number]  HPI Here for follow up of multiple medical conditions With daughter and SIL still-they are still living with her  Still has soreness and aching Right shoulder and back Walking better though---still some AM dizziness though Takes a while till "my legs work" Environmental consultant hurt shoulders--so using cane Tramadol helps  Anxiety at times--but okay without the valium Wants something else for muscle spasm Ongoing depression--feels she needs to go back up on the fluoxetine  Still checking sugars qAM Usually ~140  No hypoglycemic symptoms Never was on the [place] since home---discussed she doesn't need it  Current Outpatient Medications on File Prior to Visit  Medication Sig Dispense Refill  . ACCU-CHEK AVIVA PLUS test strip USE AS DIRECTED 100 each 2  . acetaminophen (TYLENOL) 500 MG tablet Take 1 tablet (500 mg total) by mouth 3 (three) times daily. 30 tablet 0  . amLODipine (NORVASC) 10 MG tablet TAKE 1 TABLET(10 MG) BY MOUTH DAILY 90 tablet 3  . aspirin 81 MG tablet Take 81 mg by mouth daily.      . Blood Glucose Monitoring Suppl (ACCU-CHEK AVIVA PLUS) w/Device KIT TEST BLOOD SUGAR DAILY AS DIRECTED 1 kit 0  . cholestyramine (QUESTRAN) 4 g packet MIX AND DRINK 1 PACKET BY MOUTH DAILY AS DIRECTED 60 packet 5  . docusate sodium (COLACE) 100 MG capsule Take 1 capsule (100 mg total) by mouth 2 (two) times daily. 10 capsule 0  . DULoxetine (CYMBALTA) 60 MG capsule Take 60 mg by mouth 2 (two) times daily.    Marland Kitchen glipiZIDE (GLUCOTROL) 5 MG tablet TAKE 1 TABLET BY MOUTH TWICE DAILY BEFORE A MEAL 180 tablet 3  . Lancets (ACCU-CHEK SOFT TOUCH) lancets Use as instructed.  Dx code:  E11.40 100 each 12  . losartan (COZAAR) 100 MG tablet Take 1 tablet (100 mg total) by mouth daily. 30 tablet 0  . metFORMIN (GLUCOPHAGE-XR) 500 MG 24 hr tablet Take 1 tablet (500 mg total) by mouth daily with breakfast. 30  tablet 0  . Multiple Vitamins-Minerals (MULTIVITAMIN WITH MINERALS) tablet Take 1 tablet by mouth daily.    . ondansetron (ZOFRAN ODT) 4 MG disintegrating tablet Take 1 tablet (4 mg total) by mouth every 8 (eight) hours as needed for nausea or vomiting. 20 tablet 0  . propranolol (INDERAL) 20 MG tablet Take 1 tablet (20 mg total) by mouth 2 (two) times daily. 180 tablet 1  . risedronate (ACTONEL) 150 MG tablet Take 1 tablet (150 mg total) by mouth every 30 (thirty) days. with water on empty stomach, nothing by mouth and don't lie down for next 30 minutes. 3 tablet 3  . simvastatin (ZOCOR) 20 MG tablet TAKE 1 TABLET(20 MG) BY MOUTH DAILY 90 tablet 0  . traMADol (ULTRAM) 50 MG tablet TAKE 1 TABLET(50 MG) BY MOUTH THREE TIMES DAILY AS NEEDED 90 tablet 0  . [DISCONTINUED] FLUoxetine (PROZAC) 20 MG tablet Take 1 tablet (20 mg total) by mouth daily. 90 tablet 3   No current facility-administered medications on file prior to visit.     Allergies  Allergen Reactions  . Aspirin     Able to take baby aspirin  . Bisoprolol Fumarate     REACTION: ha  . Chlorthalidone   . Lisinopril     REACTION: throat symptoms  . Sulfa Antibiotics     Told by mother as a child  . Tetracyclines &  Related Swelling    Throat swelling    Past Medical History:  Diagnosis Date  . Celiac disease   . Chronic pain   . Depression   . Diabetes mellitus   . Falls frequently    Falls/pneumonia 1/12  . Hyperlipidemia   . Hypertension   . Migraines   . Neuropathy   . Osteoporosis   . Pericarditis   . Pneumonia   . SBO (small bowel obstruction) (HCC)    obstipation  10/2005  . Uterine cancer ([place])    P32 INSERT  . Vertigo     Past Surgical History:  Procedure Laterality Date  . ABDOMINAL HYSTERECTOMY     BSO 1985  cancer  . APPENDECTOMY     1946  Adhesions shortly after  . BACK SURGERY  1970's  . BLADDER REPAIR     bladder tacking- chronic pain since  1998  . CATARACT EXTRACTION  12/12   left and then  laser---Dr Tobe Sos  . CATARACT EXTRACTION W/PHACO Right 06/07/2016   Procedure: CATARACT EXTRACTION PHACO AND INTRAOCULAR LENS PLACEMENT (IOC);  Surgeon: Birder Robson, MD;  Location: [facility];  Service: Ophthalmology;  Laterality: Right;  [place] 00:43 AP% 20.6 CDE 8.96 Fluid pack lot # [id number]  . CHOLECYSTECTOMY     lysis of adhesions--post op complications, trach, etc  8/10  . KIDNEY STONE SURGERY     cysto-- [facility] 2003  . KYPHOPLASTY N/A 06/15/2017   Procedure: [id number];  Surgeon: Hessie Knows, MD;  Location: [facility];  Service: Orthopedics;  Laterality: N/A;    Family History  Problem Relation Age of Onset  . Coronary artery disease Father   . Hypertension Neg Hx   . Diabetes Neg Hx     Social History   Socioeconomic History  . Marital status: Widowed    Spouse name: Not on file  . Number of children: 2  . Years of education: Not on file  . Highest education level: Not on file  Occupational History  . 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 Financial resource strain: Patient refused  . Food insecurity:    Worry: Patient refused    Inability: Patient refused  . Transportation needs:    Medical: Patient refused    Non-medical: Patient refused  Tobacco Use  . Smoking status: Current Some Day Smoker  . Smokeless tobacco: Never Used  . Tobacco comment: only once in a while. Gave info on 1-800-QUIT NOW  Substance and Sexual Activity  . Alcohol use: No    Alcohol/week: 0.0 oz  . Drug use: No  . Sexual activity: Not Currently  Lifestyle  . Physical activity:    Days per week: Patient refused    Minutes per session: Patient refused  . Stress: Patient refused  Relationships  . Social connections:    Talks on phone: Patient refused    Gets together: Patient refused    Attends religious service: Patient refused    Active member of club or organization: Patient refused    Attends meetings of clubs or organizations: Patient  refused    Relationship status: Patient refused  . Intimate partner violence:    Fear of current or ex partner: Patient refused    Emotionally abused: Patient refused    Physically abused: Patient refused    Forced sexual activity: Patient refused  Other Topics Concern  . Not on file  Social History Narrative   Enjoys crafts      No living will  Daughter, SIL, kids living with her know   Son [name] and daughter [name] should make health care decisions if POA needed   Would accept resuscitation attempts but no prolonged artificial ventilation   No feeding tubes if cognitively unaware          Review of Systems Eating fairly well but has lost 4# Sleeping fairly well occ loose stools    Objective:   Physical Exam  Constitutional: No distress.  Clear muscle wasting  Neck: No thyromegaly present.  Cardiovascular: Normal rate, regular rhythm and normal heart sounds. Exam reveals no gallop.  No murmur heard. Respiratory: Effort normal and breath sounds normal. No respiratory distress. She has no wheezes. She has no rales.  GI: Soft. There is no tenderness.  Musculoskeletal: She exhibits no edema.  Lymphadenopathy:    She has no cervical adenopathy.  Skin: No rash noted.  Psychiatric: Her behavior is normal.  No overt depression           Assessment & Plan:

## 2017-08-14 NOTE — Addendum Note (Signed)
Addended by: Pilar Grammes on: 08/14/2017 12:54 PM   Modules accepted: Orders

## 2017-08-14 NOTE — Assessment & Plan Note (Signed)
Overall better with daughter/SIL living with her Okay off the diazepam but feels she would do better back on the higher fluoxetine dose---will increase

## 2017-08-14 NOTE — Assessment & Plan Note (Signed)
Lab Results  Component Value Date   HGBA1C 6.3 07/14/2017   Good control on glipizide and metformin No hypoglycemia

## 2017-08-14 NOTE — Assessment & Plan Note (Signed)
Improved On the risedronate Working on strength Still needs tramadol

## 2017-08-14 NOTE — Assessment & Plan Note (Signed)
BP Readings from Last 3 Encounters:  08/14/17 130/72  07/14/17 138/76  06/15/17 (!) 173/79   On the losartan 152m

## 2017-08-24 ENCOUNTER — Other Ambulatory Visit: Payer: Self-pay | Admitting: Internal Medicine

## 2017-08-24 NOTE — Telephone Encounter (Signed)
Approved: 30 x 0

## 2017-08-24 NOTE — Telephone Encounter (Signed)
Rx sent electronically.  

## 2017-08-25 ENCOUNTER — Encounter: Payer: Self-pay | Admitting: Internal Medicine

## 2017-08-25 ENCOUNTER — Other Ambulatory Visit: Payer: Self-pay | Admitting: *Deleted

## 2017-08-25 NOTE — Telephone Encounter (Signed)
See my chart message from patient's daughter Patient does not need a refill at this time.

## 2017-08-25 NOTE — Telephone Encounter (Signed)
Is this about the propranolol? Please figure this out on Monday

## 2017-08-25 NOTE — Telephone Encounter (Signed)
Received a fax from [facility] requesting a refill on Propranolol 20 mg directions show take 2 tablets twice daily. This request does not match the medication list which shows take one tablet twice a day. Records show that script was refilled 03/15/17 #180/1 refill. Called and spoke to [name] and was advised that they received a script from Dr. Romeo Rabon on 07/04/17 with directions of take 2 tablets twice daily for hypertension #60. Was advised by pharmacist that when patient called for refill yesterday she seemed confused and was advised that patient's daughter should be contacted.  Was advised by pharmacist that script was filled 03/17/17 #180, 05/27/17 #180 and 07/04/17 #60. Called and spoke to patient's daughter Jaclyn Peterson and was advised that patient is taking the medication twice a day and she does not feel that patient needs a refill at this time. Jaclyn Peterson stated that she will check and make sure that patient does not need a refill and will call back to confirm.

## 2017-08-28 NOTE — Telephone Encounter (Signed)
It looks as though Jaclyn Peterson took care of it Friday afternoon.

## 2017-09-07 ENCOUNTER — Other Ambulatory Visit: Payer: Self-pay | Admitting: Internal Medicine

## 2017-09-07 NOTE — Telephone Encounter (Signed)
Last filled 08-10-17 #90 Last OV 08-14-17 No Future OV

## 2017-09-21 ENCOUNTER — Other Ambulatory Visit: Payer: Self-pay | Admitting: Internal Medicine

## 2017-09-21 NOTE — Telephone Encounter (Signed)
Approved:propranolol x 1 year Tizanidine #30 x 1

## 2017-10-05 ENCOUNTER — Telehealth: Payer: Self-pay | Admitting: Internal Medicine

## 2017-10-05 NOTE — Telephone Encounter (Signed)
Pt left v/m; pt was seen on 10/04/2017 at dermatologist office and had biopsy done and thinks melanoma and the biopsy site (the size of a nickel) is very sore. Pt is going to be out of tramadol on 10/06/17 and wants to know if can get refill on tramadol on 10/06/17 instead of 10/07/17. Pt request cb.

## 2017-10-05 NOTE — Telephone Encounter (Signed)
Ok to refill on 7/26

## 2017-10-05 NOTE — Telephone Encounter (Signed)
Last filled 09-07-17 #90 Last OV 08-24-17 No Future OV  [facility] [address] to [employer] in Dr Alla German absence

## 2017-10-06 NOTE — Telephone Encounter (Signed)
Left message on VM at pharmacy authorizing refill today

## 2017-10-12 HISTORY — PX: MELANOMA EXCISION: SHX5266

## 2017-10-13 ENCOUNTER — Encounter: Payer: Self-pay | Admitting: Internal Medicine

## 2017-10-13 DIAGNOSIS — C4371 Malignant melanoma of right lower limb, including hip: Secondary | ICD-10-CM | POA: Insufficient documentation

## 2017-10-20 ENCOUNTER — Encounter: Payer: Self-pay | Admitting: Internal Medicine

## 2017-10-22 MED ORDER — HEPARIN SODIUM (PORCINE) 5000 UNIT/ML IJ SOLN
5000.00 | INTRAMUSCULAR | Status: DC
Start: 2017-10-22 — End: 2017-10-22

## 2017-10-22 MED ORDER — ACETAMINOPHEN 325 MG PO TABS
650.00 | ORAL_TABLET | ORAL | Status: DC
Start: 2017-10-22 — End: 2017-10-22

## 2017-10-22 MED ORDER — AMLODIPINE BESYLATE 10 MG PO TABS
10.00 | ORAL_TABLET | ORAL | Status: DC
Start: 2017-10-23 — End: 2017-10-22

## 2017-10-22 MED ORDER — GENERIC EXTERNAL MEDICATION
5.00 | Status: DC
Start: ? — End: 2017-10-22

## 2017-10-22 MED ORDER — DEXTROSE 10 % IV SOLN
25.00 | INTRAVENOUS | Status: DC
Start: ? — End: 2017-10-22

## 2017-10-22 MED ORDER — DULOXETINE HCL 60 MG PO CPEP
60.00 | ORAL_CAPSULE | ORAL | Status: DC
Start: 2017-10-23 — End: 2017-10-22

## 2017-10-22 MED ORDER — INSULIN REGULAR HUMAN 100 UNIT/ML IJ SOLN
0.00 | INTRAMUSCULAR | Status: DC
Start: 2017-10-22 — End: 2017-10-22

## 2017-10-22 MED ORDER — KETOROLAC TROMETHAMINE 15 MG/ML IJ SOLN
15.00 | INTRAMUSCULAR | Status: DC
Start: ? — End: 2017-10-22

## 2017-10-22 MED ORDER — PROPRANOLOL HCL 20 MG PO TABS
20.00 | ORAL_TABLET | ORAL | Status: DC
Start: 2017-10-23 — End: 2017-10-22

## 2017-10-22 MED ORDER — GENERIC EXTERNAL MEDICATION
1.00 | Status: DC
Start: ? — End: 2017-10-22

## 2017-10-23 ENCOUNTER — Encounter: Payer: Self-pay | Admitting: Internal Medicine

## 2017-10-23 DIAGNOSIS — C799 Secondary malignant neoplasm of unspecified site: Secondary | ICD-10-CM | POA: Insufficient documentation

## 2017-10-23 DIAGNOSIS — C439 Malignant melanoma of skin, unspecified: Secondary | ICD-10-CM | POA: Insufficient documentation

## 2017-11-03 ENCOUNTER — Other Ambulatory Visit: Payer: Self-pay | Admitting: Internal Medicine

## 2017-11-03 NOTE — Telephone Encounter (Signed)
Name of Medication: Tramadol 50 mg Name of Pharmacy: [place] or Written Date and Quantity: # 70 on 10/05/17 Last Office Visit and Type: 05/03/19FU pain Next Office Visit and Type: none scheduled Last Controlled Substance Agreement Date: none found Last UDS:06/12/17

## 2017-11-03 NOTE — Telephone Encounter (Signed)
Pt called in and stated she called her med in early due to having 2 melanomas removed and in really bad pain.

## 2017-11-03 NOTE — Telephone Encounter (Signed)
Pt will be out of tramadol 11/04/17 and request to get med before 11/05/17 due to pain with melanoma removal. Mandy RN texted Avie Echevaria NP an Avie Echevaria NP said OK to get med 11/04/17. Called [facility] [address] [place] and spoke with Wells Guiles and gave OK to fill tramadol on 11/04/17. Pt voiced understandng and very appreciative.

## 2017-11-27 ENCOUNTER — Other Ambulatory Visit: Payer: Self-pay | Admitting: Internal Medicine

## 2017-12-04 ENCOUNTER — Other Ambulatory Visit: Payer: Self-pay | Admitting: Internal Medicine

## 2017-12-04 NOTE — Telephone Encounter (Signed)
Last filled 11-03-17 #90 Last OV 08-14-17 No Future OV  Forwarding to [facility] in Dr Alla German absence

## 2017-12-21 ENCOUNTER — Other Ambulatory Visit: Payer: Self-pay | Admitting: Internal Medicine

## 2018-01-03 ENCOUNTER — Telehealth: Payer: Self-pay | Admitting: Internal Medicine

## 2018-01-03 NOTE — Telephone Encounter (Signed)
Should have a follow up appt in the next couple of months

## 2018-01-03 NOTE — Telephone Encounter (Signed)
Last filled 12-04-17 #90 Last OV 08-14-17 No Future OV

## 2018-01-05 NOTE — Telephone Encounter (Signed)
I spoke to patient and she scheduled appointment on 02/06/18.

## 2018-01-10 ENCOUNTER — Other Ambulatory Visit: Payer: Self-pay | Admitting: Internal Medicine

## 2018-01-21 ENCOUNTER — Other Ambulatory Visit: Payer: Self-pay | Admitting: Internal Medicine

## 2018-02-02 ENCOUNTER — Other Ambulatory Visit: Payer: Self-pay | Admitting: Internal Medicine

## 2018-02-02 NOTE — Telephone Encounter (Signed)
Last filled 01-03-18 #90 Last OV 08-14-17 Next OV 08-14-17 [facility] [address] [employer] [address]

## 2018-02-06 ENCOUNTER — Ambulatory Visit: Payer: Medicare Other | Admitting: Internal Medicine

## 2018-02-22 ENCOUNTER — Telehealth: Payer: Self-pay

## 2018-02-22 NOTE — Telephone Encounter (Signed)
Please let her know that I really recommend that she take it---it can help prevent another broken bone

## 2018-02-22 NOTE — Telephone Encounter (Signed)
Spoke to pt. She said she did take it once and forgets to take it. She will make it on her calendar and her daughter will help her to remember it, too.

## 2018-02-22 NOTE — Telephone Encounter (Signed)
Received note from [facility] stating pt had not filled the Actonel. Spoke to pt and she said she has never taken it.

## 2018-02-23 ENCOUNTER — Other Ambulatory Visit: Payer: Self-pay | Admitting: Internal Medicine

## 2018-03-02 ENCOUNTER — Other Ambulatory Visit: Payer: Self-pay | Admitting: Internal Medicine

## 2018-03-02 NOTE — Telephone Encounter (Signed)
Last filled 02-02-18 #90 Last OV 08-14-17 Next OV 03-13-18 [facility] [address] [employer] [address]

## 2018-03-13 ENCOUNTER — Ambulatory Visit: Payer: Medicare Other | Admitting: Internal Medicine

## 2018-03-13 ENCOUNTER — Encounter: Payer: Self-pay | Admitting: Internal Medicine

## 2018-03-13 VITALS — BP 134/70 | HR 53 | Temp 98.2°F | Ht 65.0 in | Wt 156.0 lb

## 2018-03-13 DIAGNOSIS — E1142 Type 2 diabetes mellitus with diabetic polyneuropathy: Secondary | ICD-10-CM

## 2018-03-13 DIAGNOSIS — Z23 Encounter for immunization: Secondary | ICD-10-CM

## 2018-03-13 DIAGNOSIS — C4371 Malignant melanoma of right lower limb, including hip: Secondary | ICD-10-CM

## 2018-03-13 DIAGNOSIS — F39 Unspecified mood [affective] disorder: Secondary | ICD-10-CM | POA: Diagnosis not present

## 2018-03-13 DIAGNOSIS — I10 Essential (primary) hypertension: Secondary | ICD-10-CM | POA: Diagnosis not present

## 2018-03-13 DIAGNOSIS — S22089D Unspecified fracture of T11-T12 vertebra, subsequent encounter for fracture with routine healing: Secondary | ICD-10-CM

## 2018-03-13 LAB — POCT GLYCOSYLATED HEMOGLOBIN (HGB A1C): Hemoglobin A1C: 6.5 % — AB (ref 4.0–5.6)

## 2018-03-13 LAB — HM DIABETES FOOT EXAM

## 2018-03-13 MED ORDER — TIZANIDINE HCL 2 MG PO TABS: ORAL_TABLET | ORAL | 0 refills | Status: DC

## 2018-03-13 NOTE — Assessment & Plan Note (Addendum)
Seems to still have acceptable control Will check A1c  Lab Results  Component Value Date   HGBA1C 6.5 (A) 03/13/2018   Good control No changes needed

## 2018-03-13 NOTE — Assessment & Plan Note (Signed)
Anxiety and anger at times Controlled with Rx

## 2018-03-13 NOTE — Progress Notes (Signed)
 Subjective:    Patient ID: Jaclyn Peterson, female    DOB: 11/15/1940, 77 y.o.   MRN: [id number]  HPI Here for follow up of diabetes and other chronic health conditions With daughter (they live with her)  Reviewed recent visit about the melanoma Suspicious for relapse Getting PET scan  She is using the tizanidine bid (not daily) It does help her spasms Uses the tramadol for pain as well  Mood is good No depression Anxiety has been controlled Still gets angry at times Continues on the medication  Checks sugars daily Usually in the 150's No low sugar reactions Ongoing foot swelling and some numbness  Current Outpatient Medications on File Prior to Visit  Medication Sig Dispense Refill  . ACCU-CHEK AVIVA PLUS test strip USE AS DIRECTED 100 each 2  . ACCU-CHEK SOFTCLIX LANCETS lancets USE AS DIRECTED 100 each 3  . acetaminophen (TYLENOL) 500 MG tablet Take 1 tablet (500 mg total) by mouth 3 (three) times daily. 30 tablet 0  . amLODipine (NORVASC) 10 MG tablet TAKE 1 TABLET(10 MG) BY MOUTH DAILY 90 tablet 3  . aspirin 81 MG tablet Take 81 mg by mouth daily.      . Blood Glucose Monitoring Suppl (ACCU-CHEK AVIVA PLUS) w/Device KIT TEST BLOOD SUGAR DAILY AS DIRECTED 1 kit 0  . cholestyramine (QUESTRAN) 4 g packet MIX AND DRINK 1 PACKET BY MOUTH DAILY AS DIRECTED 60 packet 5  . docusate sodium (COLACE) 100 MG capsule Take 1 capsule (100 mg total) by mouth 2 (two) times daily. 10 capsule 0  . DULoxetine (CYMBALTA) 60 MG capsule Take 60 mg by mouth 2 (two) times daily.    . FLUoxetine (PROZAC) 40 MG capsule Take 1 capsule (40 mg total) by mouth daily. 90 capsule 3  . glipiZIDE (GLUCOTROL) 5 MG tablet TAKE 1 TABLET BY MOUTH TWICE DAILY BEFORE A MEAL 180 tablet 3  . metFORMIN (GLUCOPHAGE-XR) 500 MG 24 hr tablet Take 1 tablet (500 mg total) by mouth daily with breakfast. 30 tablet 0  . Multiple Vitamins-Minerals (MULTIVITAMIN WITH MINERALS) tablet Take 1 tablet by mouth daily.      . ondansetron (ZOFRAN ODT) 4 MG disintegrating tablet Take 1 tablet (4 mg total) by mouth every 8 (eight) hours as needed for nausea or vomiting. 20 tablet 0  . propranolol (INDERAL) 20 MG tablet TAKE 1 TABLET BY MOUTH TWICE DAILY 180 tablet 3  . risedronate (ACTONEL) 150 MG tablet Take 1 tablet (150 mg total) by mouth every 30 (thirty) days. with water on empty stomach, nothing by mouth and don't lie down for next 30 minutes. 3 tablet 3  . simvastatin (ZOCOR) 20 MG tablet TAKE 1 TABLET(20 MG) BY MOUTH DAILY 90 tablet 3  . tiZANidine (ZANAFLEX) 2 MG tablet TAKE 1 TABLET(2 MG) BY MOUTH TWICE DAILY AS NEEDED FOR MUSCLE SPASMS 30 tablet 0  . traMADol (ULTRAM) 50 MG tablet TAKE 1 TABLET BY MOUTH THREE TIMES DAILY AS NEEDED 90 tablet 0   No current facility-administered medications on file prior to visit.     Allergies  Allergen Reactions  . Aspirin     Able to take baby aspirin  . Bisoprolol Fumarate     REACTION: ha  . Chlorthalidone   . Lisinopril     REACTION: throat symptoms  . Sulfa Antibiotics     Told by mother as a child  . Tetracyclines & Related Swelling    Throat swelling    Past Medical History:  Diagnosis   Date  . Celiac disease   . Chronic pain   . Depression   . Diabetes mellitus   . Falls frequently    Falls/pneumonia 1/12  . Hyperlipidemia   . Hypertension   . Melanoma of thigh, right ([facility])   . Metastatic melanoma ([facility])   . Migraines   . Neuropathy   . Osteoporosis   . Pericarditis   . Pneumonia   . SBO (small bowel obstruction) ([facility])    obstipation  10/2005  . 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 Vertigo     Past Surgical History:  Procedure Laterality Date  . ABDOMINAL HYSTERECTOMY     BSO 1985  cancer  . APPENDECTOMY     1946  Adhesions shortly after  . BACK SURGERY  1970's  . BLADDER REPAIR     bladder tacking- chronic pain since  1998  . CATARACT EXTRACTION  12/12   left and then laser---Dr Tobe Sos  . CATARACT EXTRACTION W/PHACO Right  06/07/2016   Procedure: CATARACT EXTRACTION PHACO AND INTRAOCULAR LENS PLACEMENT (IOC);  Surgeon: Birder Robson, MD;  Location: [facility] ORS;  Service: Ophthalmology;  Laterality: Right;  [place] 00:43 AP% 20.6 CDE 8.96 Fluid pack lot # [id number]  . CHOLECYSTECTOMY     lysis of adhesions--post op complications, trach, etc  8/10  . KIDNEY STONE SURGERY     cysto-- [facility] 2003  . KYPHOPLASTY N/A 06/15/2017   Procedure: [id number];  Surgeon: Hessie Knows, MD;  Location: [facility];  Service: Orthopedics;  Laterality: N/A;  . MELANOMA EXCISION  10/2017   right thigh, with inguinal node dissection    Family History  Problem Relation Age of Onset  . Coronary artery disease Father   . Hypertension Neg Hx   . Diabetes Neg Hx     Social History   Socioeconomic History  . Marital status: Widowed    Spouse name: Not on file  . Number of children: 2  . Years of education: Not on file  . Highest education level: Not on file  Occupational History  . 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 Financial resource strain: Patient refused  . Food insecurity:    Worry: Patient refused    Inability: Patient refused  . Transportation needs:    Medical: Patient refused    Non-medical: Patient refused  Tobacco Use  . Smoking status: Current Some Day Smoker  . Smokeless tobacco: Never Used  . Tobacco comment: only once in a while. Gave info on 1-800-QUIT NOW  Substance and Sexual Activity  . Alcohol use: No    Alcohol/week: 0.0 standard drinks  . Drug use: No  . Sexual activity: Not Currently  Lifestyle  . Physical activity:    Days per week: Patient refused    Minutes per session: Patient refused  . Stress: Patient refused  Relationships  . Social connections:    Talks on phone: Patient refused    Gets together: Patient refused    Attends religious service: Patient refused    Active member of club or organization: Patient refused    Attends meetings of  clubs or organizations: Patient refused    Relationship status: Patient refused  . Intimate partner violence:    Fear of current or ex partner: Patient refused    Emotionally abused: Patient refused    Physically abused: Patient refused    Forced sexual activity: Patient refused  Other Topics Concern  . Not on file  Social  History Narrative   Enjoys crafts      No living will   Daughter, SIL, kids living with her know   Son [name] and daughter [name] should make health care decisions if POA needed   Would accept resuscitation attempts but no prolonged artificial ventilation   No feeding tubes if cognitively unaware         Review of Systems Eating well Has gained her weight back Sleeps well with melatonin 77m    Objective:   Physical Exam  Constitutional: She appears well-developed. No distress.  Neck: No thyromegaly present.  Cardiovascular: Normal rate, regular rhythm, normal heart sounds and intact distal pulses. Exam reveals no gallop.  No murmur heard. Respiratory: Effort normal and breath sounds normal. No respiratory distress. She has no wheezes. She has no rales.  Musculoskeletal:        General: No edema.  Lymphadenopathy:    She has no cervical adenopathy.  Neurological:  Decreased sensation in feet  Skin:  No foot lesions Long mycotic toenails  Psychiatric: She has a normal mood and affect. Her behavior is normal.           Assessment & Plan:

## 2018-03-13 NOTE — Assessment & Plan Note (Signed)
May have recurrence Scheduled for PET scan

## 2018-03-13 NOTE — Assessment & Plan Note (Signed)
Missed some actonel doses but will resume Will increase the tizanidine to bid

## 2018-03-13 NOTE — Assessment & Plan Note (Signed)
BP Readings from Last 3 Encounters:  03/13/18 134/70  08/14/17 130/72  07/14/17 138/76   Good control

## 2018-03-29 ENCOUNTER — Other Ambulatory Visit: Payer: Self-pay | Admitting: Internal Medicine

## 2018-04-02 ENCOUNTER — Other Ambulatory Visit: Payer: Self-pay | Admitting: Internal Medicine

## 2018-04-02 NOTE — Telephone Encounter (Signed)
Pt called office to let Dr.Letvak know she is currently out of the Tramadol.

## 2018-04-02 NOTE — Telephone Encounter (Signed)
Tramadol 42m  Last filled 03-02-18 #90 Last OV 08-14-17 Next OV 03-13-18 [facility]

## 2018-04-12 ENCOUNTER — Other Ambulatory Visit: Payer: Self-pay | Admitting: Internal Medicine

## 2018-04-21 ENCOUNTER — Other Ambulatory Visit: Payer: Self-pay | Admitting: Internal Medicine

## 2018-05-01 ENCOUNTER — Other Ambulatory Visit: Payer: Self-pay | Admitting: Internal Medicine

## 2018-05-01 NOTE — Telephone Encounter (Signed)
Last filled 04-02-18 #90 Last OV 03-13-18 No Future OV [facility] [address] [facility]

## 2018-05-11 ENCOUNTER — Other Ambulatory Visit: Payer: Self-pay | Admitting: Internal Medicine

## 2018-05-21 ENCOUNTER — Other Ambulatory Visit: Payer: Self-pay | Admitting: Internal Medicine

## 2018-05-29 ENCOUNTER — Other Ambulatory Visit: Payer: Self-pay | Admitting: Internal Medicine

## 2018-05-29 NOTE — Telephone Encounter (Signed)
Last Filled 05-01-18 #90 Last OV 03-13-18 No Future OV [facility] [place]

## 2018-05-30 ENCOUNTER — Telehealth: Payer: Self-pay

## 2018-05-30 NOTE — Telephone Encounter (Signed)
Spoke with pt to get answers to Dr. Synthia Innocent questions.  Pt states she does not have any infection.  But she has gained weight and admits to eating more potato chips than she should.  I relayed Dr. Synthia Innocent instructions.  Pt verbalizes understanding.

## 2018-05-30 NOTE — Telephone Encounter (Signed)
Any signs of infection?  Any changes in diet?  Reasonable to increase metformin XR to 2 tablets with breakfast. Increase water as well. Call us tomorrow with readings.  Lab Results  Component Value Date   HGBA1C 6.5 (A) 03/13/2018    Lab Results  Component Value Date   CREATININE 0.83 07/14/2017   BUN 12 07/14/2017   NA 140 07/14/2017   K 3.6 07/14/2017   CL 106 07/14/2017   CO2 24 07/14/2017

## 2018-05-30 NOTE — Telephone Encounter (Signed)
Pt left v/m pt said at 10:30 this morning her FBS was 463. Was also high on 05/29/18 FBS 300 and something. Pt does not know why BS so high.No symptoms. Pt has not gotten upset or had any pain. Pt said she has gained some weight. Pt taking glipizide 5 mg only once a day before dinner meal. And takes metformin XR 500 mg one tab before breakfast. Pt states she has missed no medication. Pt wants to know if she should increase her metformin. Pt checked BS at 4PM and was 243. Pt request cb. ED precautions given. Pt voiced understanding. Dr Silvio Pate not in office today and will not be in office until afternoon on 05/31/18.

## 2018-05-31 MED ORDER — ACCU-CHEK GUIDE W/DEVICE KIT: 1.0000 | PACK | Freq: Once | 0 refills | Status: AC

## 2018-05-31 MED ORDER — GLUCOSE BLOOD VI STRP: ORAL_STRIP | 4 refills | Status: AC

## 2018-05-31 NOTE — Telephone Encounter (Addendum)
Pt called into triage. She wanted to 1st thank everyone that helped her get a new meter. Pt said she did take her metformin this morning before breakfast. Pt has taken her blood sugar with the meter for the 1st time about a hour ago and she said it was 350 so she wanted PCP to be aware

## 2018-05-31 NOTE — Telephone Encounter (Signed)
Larene Beach CMA is addressing already.

## 2018-05-31 NOTE — Addendum Note (Signed)
Addended by: Pilar Grammes on: 05/31/2018 11:19 AM   Modules accepted: Orders

## 2018-05-31 NOTE — Telephone Encounter (Signed)
New rxs sent in.

## 2018-05-31 NOTE — Telephone Encounter (Signed)
Pt called back and said OneTouch or Accu-Chek

## 2018-05-31 NOTE — Telephone Encounter (Signed)
She should probably just check AM fasting (and can check right before supper also every few days). With the increased metformin, hopefully the AM fasting sugars should be back under 200 in the next few days

## 2018-05-31 NOTE — Telephone Encounter (Signed)
[facility] Patient Name: Jaclyn Peterson Gender: Female DOB: 16-Mar-1940 Age: 77 Y 82 M 17 D Return Phone Number: [phone] (Primary) Address: City/State/Zip: [place] [address] Client [facility] Night - Client Client Site [facility] Physician Viviana Simpler - MD Contact Type Call Who Is Calling Patient / Member / Family / Caregiver Call Type Triage / Clinical Relationship To Patient Self Return Phone Number [phone] (Primary) Chief Complaint Blood Sugar High Reason for Call Symptomatic / Request for Health Information Initial Comment Caller states her BS is 293. Her Blood sugar meter is broken and needs a new one called. No symptoms Translation No Nurse Assessment Guidelines Guideline Title Affirmed Question Affirmed Notes Nurse Date/Time (Eastern Time) Disp. Time Eilene Ghazi Time) Disposition Final User 05/30/2018 10:47:29 PM FINAL ATTEMPT MADE - message left Yes Jasmine Pang, RN, Olivia Mackie

## 2018-05-31 NOTE — Telephone Encounter (Signed)
Left detailed message on VM per DPR

## 2018-05-31 NOTE — Telephone Encounter (Signed)
Pt called back to say her meter has stopped working and cannot check her number as directed yesterday. I advised her to call her insurance to see what they cover. She will call me back so I can call in the correct meter.

## 2018-05-31 NOTE — Telephone Encounter (Signed)
Yes--confirm the increase in metformin to 1034m qAM Make sure she gets the new meter

## 2018-06-08 ENCOUNTER — Other Ambulatory Visit: Payer: Self-pay | Admitting: Internal Medicine

## 2018-06-20 ENCOUNTER — Other Ambulatory Visit: Payer: Self-pay | Admitting: Internal Medicine

## 2018-06-20 NOTE — Telephone Encounter (Signed)
Last done 3/27 ---find out what is going on.

## 2018-06-28 ENCOUNTER — Other Ambulatory Visit: Payer: Self-pay | Admitting: Internal Medicine

## 2018-06-28 NOTE — Telephone Encounter (Signed)
Last filled 05-29-18 #90 Last OV 03-13-18 No Future OV [facility] [address] [facility]

## 2018-07-08 ENCOUNTER — Telehealth: Payer: Medicare Other | Admitting: Physician Assistant

## 2018-07-08 DIAGNOSIS — R059 Cough, unspecified: Secondary | ICD-10-CM

## 2018-07-08 DIAGNOSIS — R079 Chest pain, unspecified: Secondary | ICD-10-CM

## 2018-07-08 DIAGNOSIS — R05 Cough: Secondary | ICD-10-CM

## 2018-07-08 NOTE — Progress Notes (Signed)
Based on what you shared with me, I feel your condition warrants further evaluation and I recommend that you be seen for a face to face office visit. More specifically, I would advise that you go to the ED for expidited care.My biggest concern for your safety is a pneumonia, which can be life threatening.     NOTE: If you entered your credit card information for this eVisit, you will not be charged. You may see a "hold" on your card for the $35 but that hold will drop off and you will not have a charge processed.  If you are having a true medical emergency please call 911.If you need an urgent face to face visit, [facility] has four urgent care centers for your convenience.  PLEASE NOTE: THE [facility] LOCATIONS AND URGENT CARE CLINICS DO NOT HAVE THE TESTING FOR CORONAVIRUS COVID19 AVAILABLE.IF YOU FEEL YOU NEED THIS TEST YOU MUST GO TO A TRIAGE LOCATION AT [facility] ?  [email or web address] to reserve your spot online an avoid wait times  [facility] [address], [address] [place], [place] [address] Modified hours of operation: Monday-Friday, 10 AM to 6 PM  Saturday & Sunday 10 AM to 4 PM *Across the street from [place] (New Address!) [address], [place], [place] [address] *Just off [address], across the road from [place] hours of operation: Monday-Friday, 10 AM to 5 PM  Closed Saturday & Sunday   The following sites will take your insurance:  [facility] a Provider at this Location  [place], [place] [address] 10 am to 8 pm Monday-Friday 12 pm to 8 pm [facility] a Provider at this Location  [place], [place], [place] [address] 8 am to 8 pm  Monday-Friday 9 am to 6 pm Saturday 11 am to 6 pm Sunday   [facility]  [phone] Get Driving Directions  [address] [place], [place] [address] 8 am to 8 pm Monday-Friday 8 am to 4 pm Saturday-Sunday   Your e-visit answers were reviewed by a board certified advanced clinical practitioner to complete your personal care plan.Thank you for using e-Visits.   ----- Message -----  From: Tawana Scale  Sent: 07/14/8880 12:26 PM EDT  To: E-Visit Mailing List Subject: E-Visit Submission: Cough  E-Visit Submission: Cough --------------------------------  Question: How long have you been coughing? Answer: 3 days  Question: How would you describe the cough? Answer: A cough from congested lungs  Question: How often are you coughing? Answer: In spasms that come and go  Question: Does the cough prevent you from sleeping at night? Answer: Yes  Question: What other symptoms have you experienced with the cough? Answer: Chest pain  Wheezing  Question: Do you have a fever? Answer: No, I do not have a fever  Question: Are you coughing up any mucus? Answer: I am coughing up a little bit of mucus  Question: Do you use a maintenance inhaler? Answer: No  Question: Do you use a rescue inhaler (such as Ventolin?) Answer: No  Question: Have you previously required a prescription for prednisone for cough? Answer: No  Question: Are you diabetic? Answer: Yes  Question: What is the appearance of the mucus? Answer: The mucus is thick  Question:  Do you have any of the following? Answer: Fatigue  Question: Do you smoke? Answer: Yes  Question: Are there people you know with similar symptoms? Answer: No  Question: Are you experiencing any of the following? Answer: Coughing more when lying  Question: Are you having difficulty breathing? Answer: Yes  Question: Please  describe what kind of difficulty you are having breathing. Answer: Starting Thursday night, severe pain started in rib area and back.I am unable to take a deep breath without severe pain.I am coughing and feel very congested, again pain when I do.Very tired.Feeling worse every day.  Question: Is your coughing worse when you are exposed to pollen, dust, or other things in the environment? Answer: I dont know  Question: Have you been treated for a similar cough in the past? Answer: No  Question: Have you ever been diagnosed with asthma, bronchitis, or lung disease? Answer: Yes  Question: Please enter a few details about your earlier diagnosis and treatment Answer: Bronchitis in the past.This cough combined with the rib/chest pain has made this worse than anything I have had before.  Question: Have you recently started on any medications for your heart or for blood pressure? Answer: No  Question: Have you recently been hospitalized? Answer: No  Question: Please list your medication allergies that you may have ? (If 'none' , please list as 'none') Answer: Aspirin  Question: Please list any additional comments  Answer: This happened quickly Thursday night.I was fine.Then, this pain in my ribs started.I feel like I might have pulled a rib picking something up.Then, the coughing and congestion.Trouble taking a deep breath with out the rib/chest pain.No fever.Very weak.

## 2018-07-09 ENCOUNTER — Encounter: Payer: Self-pay | Admitting: Internal Medicine

## 2018-07-09 ENCOUNTER — Ambulatory Visit (INDEPENDENT_AMBULATORY_CARE_PROVIDER_SITE_OTHER): Payer: Medicare Other | Admitting: Internal Medicine

## 2018-07-09 ENCOUNTER — Other Ambulatory Visit: Payer: Self-pay | Admitting: Internal Medicine

## 2018-07-09 ENCOUNTER — Other Ambulatory Visit: Payer: Self-pay

## 2018-07-09 VITALS — Resp 18

## 2018-07-09 DIAGNOSIS — J209 Acute bronchitis, unspecified: Secondary | ICD-10-CM | POA: Diagnosis not present

## 2018-07-09 DIAGNOSIS — J42 Unspecified chronic bronchitis: Secondary | ICD-10-CM

## 2018-07-09 MED ORDER — AZITHROMYCIN 250 MG PO TABS: ORAL_TABLET | ORAL | 0 refills | Status: DC

## 2018-07-09 MED ORDER — PREDNISONE 20 MG PO TABS: 40.0000 mg | ORAL_TABLET | Freq: Every day | ORAL | 0 refills | Status: DC

## 2018-07-09 NOTE — Assessment & Plan Note (Signed)
Has recurrent respiratory infections and chronic changes on CXR Now with recurrence of lower respiratory tract infection  Hasn't travelled and family only goes to store, etc---but could be COVID Will treat with prednisone for 5 days and antibiotic If worsens, will need to go to ER for COVID testing, CXR, etc

## 2018-07-09 NOTE — Progress Notes (Signed)
Subjective:    Patient ID: Jaclyn Peterson, female    DOB: 1940-04-23, 78 y.o.   MRN: [id number]  HPI Virtual visit for cough and apparent respiratory infection Identification done Reviewed billing and she gave consent She is at home and I am in my office Daughter is there helping with her  "I think I have pneumonia"" Started 4 nights ago with bad congestion Can hear it in my chest "I feel so awful" No fever but shivers some Some SOB Still smoking--but not much Cough with clear sputum Is wheezing  Has tried mucinex and tylenol Tramadol for the pain in chest and ribs due to cough  Current Outpatient Medications on File Prior to Visit  Medication Sig Dispense Refill  . ACCU-CHEK SOFTCLIX LANCETS lancets USE AS DIRECTED 100 each 3  . acetaminophen (TYLENOL) 500 MG tablet Take 1 tablet (500 mg total) by mouth 3 (three) times daily. 30 tablet 0  . amLODipine (NORVASC) 10 MG tablet TAKE 1 TABLET(10 MG) BY MOUTH DAILY 90 tablet 3  . cholestyramine (QUESTRAN) 4 g packet MIX AND DRINK 1 PACKET BY MOUTH DAILY AS DIRECTED 60 each 5  . docusate sodium (COLACE) 100 MG capsule Take 1 capsule (100 mg total) by mouth 2 (two) times daily. 10 capsule 0  . DULoxetine (CYMBALTA) 60 MG capsule TAKE 1 CAPSULE BY MOUTH TWICE DAILY 180 capsule 0  . FLUoxetine (PROZAC) 40 MG capsule TAKE 1 CAPSULE(40 MG) BY MOUTH DAILY 90 capsule 0  . glipiZIDE (GLUCOTROL) 5 MG tablet TAKE 1 TABLET BY MOUTH TWICE DAILY BEFORE A MEAL 180 tablet 3  . glucose blood (ACCU-CHEK GUIDE) test strip Use to check blood sugar once a day. Dx Code E11.42 100 each 4  . metFORMIN (GLUCOPHAGE-XR) 500 MG 24 hr tablet Take 1 tablet (500 mg total) by mouth daily with breakfast. 30 tablet 0  . Multiple Vitamins-Minerals (MULTIVITAMIN WITH MINERALS) tablet Take 1 tablet by mouth daily.    . ondansetron (ZOFRAN ODT) 4 MG disintegrating tablet Take 1 tablet (4 mg total) by mouth every 8 (eight) hours as needed for nausea or vomiting.  20 tablet 0  . propranolol (INDERAL) 20 MG tablet TAKE 1 TABLET BY MOUTH TWICE DAILY 180 tablet 3  . risedronate (ACTONEL) 150 MG tablet Take 1 tablet (150 mg total) by mouth every 30 (thirty) days. with water on empty stomach, nothing by mouth and don't lie down for next 30 minutes. 3 tablet 3  . simvastatin (ZOCOR) 20 MG tablet TAKE 1 TABLET(20 MG) BY MOUTH DAILY 90 tablet 3  . tiZANidine (ZANAFLEX) 2 MG tablet TAKE 1 TABLET(2 MG) BY MOUTH TWICE DAILY AS NEEDED FOR MUSCLE SPASMS 60 tablet 0  . traMADol (ULTRAM) 50 MG tablet TAKE 1 TABLET BY MOUTH THREE TIMES DAILY AS NEEDED 90 tablet 0   No current facility-administered medications on file prior to visit.     Allergies  Allergen Reactions  . Aspirin     Able to take baby aspirin  . Bisoprolol Fumarate     REACTION: ha  . Chlorthalidone   . Lisinopril     REACTION: throat symptoms  . Sulfa Antibiotics     Told by mother as a child  . Tetracyclines & Related Swelling    Throat swelling    Past Medical History:  Diagnosis Date  . Celiac disease   . Chronic pain   . Depression   . Diabetes mellitus   . Falls frequently    Falls/pneumonia 1/12  .  Hyperlipidemia   . Hypertension   . Melanoma of thigh, right ([place])   . Metastatic melanoma ([place])   . Migraines   . Neuropathy   . Osteoporosis   . Pericarditis   . Pneumonia   . SBO (small bowel obstruction) ([facility])    obstipation  10/2005  . Uterine cancer ([place])    P32 INSERT  . Vertigo     Past Surgical History:  Procedure Laterality Date  . ABDOMINAL HYSTERECTOMY     BSO 1985  cancer  . APPENDECTOMY     1946  Adhesions shortly after  . BACK SURGERY  1970's  . BLADDER REPAIR     bladder tacking- chronic pain since  1998  . CATARACT EXTRACTION  12/12   left and then laser---Dr Tobe Sos  . CATARACT EXTRACTION W/PHACO Right 06/07/2016   Procedure: CATARACT EXTRACTION PHACO AND INTRAOCULAR LENS PLACEMENT (IOC);  Surgeon: Birder Robson, MD;  Location: [facility] ORS;  Service:  Ophthalmology;  Laterality: Right;  [place] 00:43 AP% 20.6 CDE 8.96 Fluid pack lot # [id number]  . CHOLECYSTECTOMY     lysis of adhesions--post op complications, trach, etc  8/10  . KIDNEY STONE SURGERY     cysto-- [facility] 2003  . KYPHOPLASTY N/A 06/15/2017   Procedure: [id number];  Surgeon: Hessie Knows, MD;  Location: [facility] OR[facility];  Service: Orthopedics;  Laterality: N/A;  . MELANOMA EXCISION  10/2017   right thigh, with inguinal node dissection    Family History  Problem Relation Age of Onset  . Coronary artery disease Father   . Hypertension Neg Hx   . Diabetes Neg Hx     Social History   Socioeconomic History  . Marital status: Widowed    Spouse name: Not on file  . Number of children: 2  . Years of education: Not on file  . Highest education level: Not on file  Occupational History  . 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 Financial resource strain: Patient refused  . Food insecurity:    Worry: Patient refused    Inability: Patient refused  . Transportation needs:    Medical: Patient refused    Non-medical: Patient refused  Tobacco Use  . Smoking status: Current Some Day Smoker  . Smokeless tobacco: Never Used  . Tobacco comment: only once in a while. Gave info on 1-800-QUIT NOW  Substance and Sexual Activity  . Alcohol use: No    Alcohol/week: 0.0 standard drinks  . Drug use: No  . Sexual activity: Not Currently  Lifestyle  . Physical activity:    Days per week: Patient refused    Minutes per session: Patient refused  . Stress: Patient refused  Relationships  . Social connections:    Talks on phone: Patient refused    Gets together: Patient refused    Attends religious service: Patient refused    Active member of club or organization: Patient refused    Attends meetings of clubs or organizations: Patient refused    Relationship status: Patient refused  . Intimate partner violence:    Fear of current or ex partner: Patient  refused    Emotionally abused: Patient refused    Physically abused: Patient refused    Forced sexual activity: Patient refused  Other Topics Concern  . Not on file  Social History Narrative   Enjoys crafts      No living will   Daughter, SIL, kids living with her know   Son [name] and daughter [name] should  make health care decisions if POA needed   Would accept resuscitation attempts but no prolonged artificial ventilation   No feeding tubes if cognitively unaware          Review of Systems No one ill in household She hasn't travelled No N/V Able to eat    Objective:   Physical Exam  Constitutional: She appears well-developed. No distress.  Respiratory:  Not labored I don't hear any wheeze over the virtual visit  Psychiatric: She has a normal mood and affect. Her behavior is normal.           Assessment & Plan:

## 2018-07-10 ENCOUNTER — Telehealth: Payer: Self-pay

## 2018-07-10 MED ORDER — TIZANIDINE HCL 2 MG PO TABS: 2.0000 mg | ORAL_TABLET | Freq: Two times a day (BID) | ORAL | 0 refills | Status: DC | PRN

## 2018-07-10 NOTE — Telephone Encounter (Signed)
Daughter was unaware she needed the refill yesterday. Rx built for refill.

## 2018-07-10 NOTE — Telephone Encounter (Signed)
Pt left v/m requesting cb why cannot refill tizanidne or to have tizanidine refilled to [facility] [address].Please advise.

## 2018-07-20 ENCOUNTER — Other Ambulatory Visit: Payer: Self-pay | Admitting: Internal Medicine

## 2018-07-20 ENCOUNTER — Telehealth: Payer: Self-pay | Admitting: *Deleted

## 2018-07-20 NOTE — Telephone Encounter (Signed)
Christine notified as instructed by telephone and verbalized understanding. Jaclyn Peterson stated that patient is not running a fever and her breathing is okay. Advised Jaclyn Peterson if her mom starts running a fever or having any difficulty breathing to let Dr. Silvio Pate know or seek medical attention and she verbalized understanding.

## 2018-07-20 NOTE — Telephone Encounter (Signed)
Jaclyn Peterson's daughter called stating that her cough is better. Jaclyn Peterson's daughter stated that she is having a lot of pain in her torso when she gets up or moves around. The pain is so bad it takes her breathe away, Jaclyn Peterson's daughter has no idea what has happened. Jaclyn Peterson stated that the  pain level is 8 when she stands up, Jaclyn Peterson and her daughter wants some advice from Dr. Silvio Pate. Pharmacy [facility]

## 2018-07-20 NOTE — Telephone Encounter (Signed)
I am glad her cough is better. She may have pulled something with the severity of her cough before If she has no fever and her breathing is okay, it is probably okay to wait it out. It might help to wear a binder (like velcro closure around her chest) if that helps Might need another visit to evaluate (but I suspect a virtual visit again would not be enough)

## 2018-07-26 ENCOUNTER — Other Ambulatory Visit: Payer: Self-pay | Admitting: Internal Medicine

## 2018-07-26 NOTE — Telephone Encounter (Signed)
Last filled 06-28-18 #90 Last OV 07-09-18 Next OV 09-03-18 [facility] [address]

## 2018-08-07 ENCOUNTER — Other Ambulatory Visit: Payer: Self-pay | Admitting: Internal Medicine

## 2018-08-07 ENCOUNTER — Other Ambulatory Visit: Payer: Self-pay | Admitting: *Deleted

## 2018-08-07 NOTE — Telephone Encounter (Signed)
Patient left a voicemail stating that she needs a refill on her tizanidine. Patient stated that her pharmacy requested it a couple of weeks ago on automatic refills and it was too soon.  Last refill 07/10/18 #60 Last office visit 07/09/18

## 2018-08-08 MED ORDER — TIZANIDINE HCL 2 MG PO TABS: 2.0000 mg | ORAL_TABLET | Freq: Two times a day (BID) | ORAL | 0 refills | Status: DC | PRN

## 2018-08-15 ENCOUNTER — Other Ambulatory Visit: Payer: Self-pay | Admitting: Internal Medicine

## 2018-08-24 ENCOUNTER — Other Ambulatory Visit: Payer: Self-pay | Admitting: Internal Medicine

## 2018-08-24 NOTE — Telephone Encounter (Signed)
Last filled 07-26-18 #90 Last OV 07-09-18 No Future OV [facility] [address]

## 2018-08-27 ENCOUNTER — Other Ambulatory Visit: Payer: Self-pay | Admitting: Internal Medicine

## 2018-08-29 ENCOUNTER — Telehealth: Payer: Self-pay | Admitting: *Deleted

## 2018-08-29 NOTE — Telephone Encounter (Signed)
Patient called stating that her feet started swelling last week. Patient stated that she has been eating more salt and tomatoes lately. Patient stated that she is not having any symptoms. Patient stated that her blood sugars are up again and this morning it was 314. Patient wants to know if she needs to come in or can you advise her what she should do?

## 2018-08-30 NOTE — Telephone Encounter (Signed)
She should cut back on the salt and keep her legs elevated She is due for a visit anyway--set her up for next week (virtual if possible) Should seek attention before that if SOB Monitor the sugars and watch what she is eating for sugars also---control has been fine so wouldn't change meds unless persistent elevations

## 2018-08-30 NOTE — Telephone Encounter (Signed)
Pt called back to say her swelling went way down overnight. She watched what she ate and elevated her legs. She said if it worsens again, she will call to do a virtual visit.

## 2018-09-07 ENCOUNTER — Other Ambulatory Visit: Payer: Self-pay | Admitting: Internal Medicine

## 2018-09-07 NOTE — Telephone Encounter (Signed)
Last filled 08-08-18 #60 Last OV 07-09-18 No Future OV [facility] [address]

## 2018-09-07 NOTE — Telephone Encounter (Signed)
Pt left v/m requesting cb of status of tizanidine refill.

## 2018-09-15 ENCOUNTER — Emergency Department
Admission: EM | Admit: 2018-09-15 | Discharge: 2018-09-15 | Disposition: A | Discharge status: Other Acute Inpatient Hospital | Payer: Medicare Other | Attending: Emergency Medicine | Admitting: Emergency Medicine

## 2018-09-15 ENCOUNTER — Other Ambulatory Visit: Payer: Self-pay

## 2018-09-15 ENCOUNTER — Encounter: Payer: Self-pay | Admitting: *Deleted

## 2018-09-15 ENCOUNTER — Emergency Department: Payer: Medicare Other

## 2018-09-15 DIAGNOSIS — I10 Essential (primary) hypertension: Secondary | ICD-10-CM | POA: Insufficient documentation

## 2018-09-15 DIAGNOSIS — C7931 Secondary malignant neoplasm of brain: Secondary | ICD-10-CM | POA: Diagnosis not present

## 2018-09-15 DIAGNOSIS — F0390 Unspecified dementia without behavioral disturbance: Secondary | ICD-10-CM | POA: Insufficient documentation

## 2018-09-15 DIAGNOSIS — F17210 Nicotine dependence, cigarettes, uncomplicated: Secondary | ICD-10-CM | POA: Diagnosis not present

## 2018-09-15 DIAGNOSIS — Z7984 Long term (current) use of oral hypoglycemic drugs: Secondary | ICD-10-CM | POA: Diagnosis not present

## 2018-09-15 DIAGNOSIS — J690 Pneumonitis due to inhalation of food and vomit: Secondary | ICD-10-CM | POA: Diagnosis not present

## 2018-09-15 DIAGNOSIS — N390 Urinary tract infection, site not specified: Secondary | ICD-10-CM | POA: Insufficient documentation

## 2018-09-15 DIAGNOSIS — R569 Unspecified convulsions: Secondary | ICD-10-CM | POA: Diagnosis present

## 2018-09-15 DIAGNOSIS — Z79899 Other long term (current) drug therapy: Secondary | ICD-10-CM | POA: Insufficient documentation

## 2018-09-15 DIAGNOSIS — E119 Type 2 diabetes mellitus without complications: Secondary | ICD-10-CM | POA: Insufficient documentation

## 2018-09-15 DIAGNOSIS — I619 Nontraumatic intracerebral hemorrhage, unspecified: Secondary | ICD-10-CM | POA: Diagnosis not present

## 2018-09-15 DIAGNOSIS — Z03818 Encounter for observation for suspected exposure to other biological agents ruled out: Secondary | ICD-10-CM | POA: Diagnosis not present

## 2018-09-15 DIAGNOSIS — C439 Malignant melanoma of skin, unspecified: Secondary | ICD-10-CM | POA: Diagnosis not present

## 2018-09-15 LAB — URINALYSIS, COMPLETE (UACMP) WITH MICROSCOPIC
Bilirubin Urine: NEGATIVE
Glucose, UA: 50 mg/dL — AB
Ketones, ur: NEGATIVE mg/dL
Leukocytes,Ua: NEGATIVE
Nitrite: POSITIVE — AB
Protein, ur: 100 mg/dL — AB
Specific Gravity, Urine: 1.015 (ref 1.005–1.030)
Squamous Epithelial / LPF: NONE SEEN (ref 0–5)
pH: 5 (ref 5.0–8.0)

## 2018-09-15 LAB — COMPREHENSIVE METABOLIC PANEL
ALT: 13 U/L (ref 0–44)
AST: 26 U/L (ref 15–41)
Albumin: 4 g/dL (ref 3.5–5.0)
Alkaline Phosphatase: 71 U/L (ref 38–126)
Anion gap: 19 — ABNORMAL HIGH (ref 5–15)
BUN: 23 mg/dL (ref 8–23)
CO2: 12 mmol/L — ABNORMAL LOW (ref 22–32)
Calcium: 9 mg/dL (ref 8.9–10.3)
Chloride: 107 mmol/L (ref 98–111)
Creatinine, Ser: 1.16 mg/dL — ABNORMAL HIGH (ref 0.44–1.00)
GFR calc Af Amer: 52 mL/min — ABNORMAL LOW (ref >60)
GFR calc non Af Amer: 45 mL/min — ABNORMAL LOW (ref >60)
Glucose, Bld: 198 mg/dL — ABNORMAL HIGH (ref 70–99)
Potassium: 3.7 mmol/L (ref 3.5–5.1)
Sodium: 138 mmol/L (ref 135–145)
Total Bilirubin: 0.4 mg/dL (ref 0.3–1.2)
Total Protein: 7.3 g/dL (ref 6.5–8.1)

## 2018-09-15 LAB — CBC WITH DIFFERENTIAL/PLATELET
Abs Immature Granulocytes: 0.11 10*3/uL — ABNORMAL HIGH (ref 0.00–0.07)
Basophils Absolute: 0.1 10*3/uL (ref 0.0–0.1)
Basophils Relative: 1 %
Eosinophils Absolute: 0.4 10*3/uL (ref 0.0–0.5)
Eosinophils Relative: 2 %
HCT: 39.2 % (ref 36.0–46.0)
Hemoglobin: 12.8 g/dL (ref 12.0–15.0)
Immature Granulocytes: 1 %
Lymphocytes Relative: 26 %
Lymphs Abs: 5.3 10*3/uL — ABNORMAL HIGH (ref 0.7–4.0)
MCH: 31.3 pg (ref 26.0–34.0)
MCHC: 32.7 g/dL (ref 30.0–36.0)
MCV: 95.8 fL (ref 80.0–100.0)
Monocytes Absolute: 1.9 10*3/uL — ABNORMAL HIGH (ref 0.1–1.0)
Monocytes Relative: 9 %
Neutro Abs: 12.3 10*3/uL — ABNORMAL HIGH (ref 1.7–7.7)
Neutrophils Relative %: 61 %
Platelets: 358 10*3/uL (ref 150–400)
RBC: 4.09 MIL/uL (ref 3.87–5.11)
RDW: 12.7 % (ref 11.5–15.5)
WBC: 20 10*3/uL — ABNORMAL HIGH (ref 4.0–10.5)
nRBC: 0 % (ref 0.0–0.2)

## 2018-09-15 LAB — SALICYLATE LEVEL: Salicylate Lvl: <7 mg/dL (ref 2.8–30.0)

## 2018-09-15 LAB — ACETAMINOPHEN LEVEL: Acetaminophen (Tylenol), Serum: <10 ug/mL — ABNORMAL LOW (ref 10–30)

## 2018-09-15 LAB — TROPONIN I (HIGH SENSITIVITY)
Troponin I (High Sensitivity): 13 ng/L (ref <18)
Troponin I (High Sensitivity): 21 ng/L — ABNORMAL HIGH (ref <18)

## 2018-09-15 LAB — LACTIC ACID, PLASMA
Lactic Acid, Venous: 3.6 mmol/L (ref 0.5–1.9)
Lactic Acid, Venous: 1.9 mmol/L (ref 0.5–1.9)

## 2018-09-15 LAB — SARS CORONAVIRUS 2 BY RT PCR (HOSPITAL ORDER, PERFORMED IN ~~LOC~~ HOSPITAL LAB): SARS Coronavirus 2: NEGATIVE

## 2018-09-15 LAB — PROTIME-INR
INR: 0.9 (ref 0.8–1.2)
Prothrombin Time: 12 seconds (ref 11.4–15.2)

## 2018-09-15 LAB — ETHANOL: Alcohol, Ethyl (B): <10 mg/dL (ref <10)

## 2018-09-15 LAB — GLUCOSE, CAPILLARY: Glucose-Capillary: 189 mg/dL — ABNORMAL HIGH (ref 70–99)

## 2018-09-15 MED ORDER — VANCOMYCIN HCL IN DEXTROSE 1-5 GM/200ML-% IV SOLN
1000.0000 mg | Freq: Once | INTRAVENOUS | Status: AC
  Administered 2018-09-15: 04:00:00 1000 mg via INTRAVENOUS
  Filled 2018-09-15: qty 200

## 2018-09-15 MED ORDER — LEVETIRACETAM IN NACL 1000 MG/100ML IV SOLN
1000.0000 mg | Freq: Once | INTRAVENOUS | Status: AC
  Administered 2018-09-15: 1000 mg via INTRAVENOUS
  Filled 2018-09-15: qty 100

## 2018-09-15 MED ORDER — SODIUM CHLORIDE 0.9 % IV BOLUS
1000.0000 mL | Freq: Once | INTRAVENOUS | Status: AC
  Administered 2018-09-15: 1000 mL via INTRAVENOUS

## 2018-09-15 MED ORDER — FENTANYL CITRATE (PF) 100 MCG/2ML IJ SOLN
25.0000 ug | Freq: Once | INTRAMUSCULAR | Status: AC
  Administered 2018-09-15: 10:00:00 25 ug via INTRAVENOUS
  Filled 2018-09-15: qty 2

## 2018-09-15 MED ORDER — LORAZEPAM 2 MG/ML IJ SOLN
1.0000 mg | Freq: Once | INTRAMUSCULAR | Status: AC
  Administered 2018-09-15: 03:00:00 1 mg via INTRAVENOUS

## 2018-09-15 MED ORDER — PIPERACILLIN-TAZOBACTAM 3.375 G IVPB 30 MIN
3.3750 g | Freq: Once | INTRAVENOUS | Status: AC
  Administered 2018-09-15: 04:00:00 3.375 g via INTRAVENOUS
  Filled 2018-09-15: qty 50

## 2018-09-15 NOTE — ED Notes (Signed)
Called daughter and updated daughter on her transfer

## 2018-09-15 NOTE — ED Notes (Signed)
Pt urinated in brief. Cleaned with wipes, new brief and chuck placed. Pt states she has a headache, will request medication from MD.

## 2018-09-15 NOTE — ED Notes (Signed)
emtala reviewed by this RN 

## 2018-09-15 NOTE — ED Triage Notes (Signed)
Pt presents w/ new onset seizures. Pt reported by daughter to have cried out at home, daughter witnessed first seizure. Pt has had 2 seizures for EMS.

## 2018-09-15 NOTE — ED Notes (Signed)
Spoke with [employer]- gave them an update on pt and stated they would call back with an ETA

## 2018-09-15 NOTE — ED Provider Notes (Signed)
Vitals:   09/15/18 0830 09/15/18 0847  BP: (!) 144/77 (!) 154/87  Pulse: 69 (!) 34  Resp: 16 14  Temp:    SpO2: 97% 99%     Patient reports moderate headache.  Symptoms been going on for a little while now.  She would like something stronger for headache, she is alert and heart rate 64, saturation 97%.  Fully alert.  Awaiting transport to arrive from [facility] still.  Calling to re-inquire on time for transport to arrive  Will administer fentanyl 25 mcg, discussed with nursing staff to remain on monitor and pulse oximetry.   Delman Kitten, MD 09/15/18 [phone]

## 2018-09-15 NOTE — ED Notes (Signed)
Pt urinated in brief. Cleaned with warm wipes and placed clean brief on pt. Repositioned pt in bed. Verbalized comfort.

## 2018-09-15 NOTE — ED Provider Notes (Signed)
-----------------------------------------   10:05 AM on 09/15/2018 -----------------------------------------  Patient resting.  [facility] care team has arrived.  She is alert, oriented.  Trying to get comfortable having little discomfort try to put a comfortable spot in the bed but otherwise is fully alert oriented and well-appearing.  She does report a headache has improved some after fentanyl, she appears appropriate for ongoing care with [facility] ER care team.   Delman Kitten, MD 09/15/18 1005

## 2018-09-15 NOTE — ED Provider Notes (Signed)
[facility] Emergency Department Provider Note   ____________________________________________   First MD Initiated Contact with Patient 09/15/18 [phone]     (approximate)  I have reviewed the triage vital signs and the nursing notes.   HISTORY  Chief Complaint Seizures  Level V caveat: limited by decreased LOC  HPI Jaclyn Peterson is a 78 y.o. female brought to the ED via EMS from home with a chief complaint of seizures. History of diabetes, hypertension, melanoma whose daughter heard patient cry out and found patient with generalized seizure. No history of seizure disorder. EMS reports patient was post-ictal for approximately 30 minutes before returning to her baseline. No meds given en route. Had 1 minute tonic-clonic seizure while being transported to treatment room. Daughter told EMS no recent illness or trauma.       Past Medical History:  Diagnosis Date   Celiac disease    Chronic pain    Depression    Diabetes mellitus    Falls frequently    Falls/pneumonia 1/12   Hyperlipidemia    Hypertension    Melanoma of thigh, right ([place])    Metastatic melanoma ([place])    Migraines    Neuropathy    Osteoporosis    Pericarditis    Pneumonia    SBO (small bowel obstruction) ([place])    obstipation  10/2005   Uterine cancer ([place])    P32 INSERT   Vertigo     Patient Active Problem List   Diagnosis Date Noted   Acute exacerbation of chronic bronchitis ([place]) 07/09/2018   Metastatic melanoma ([place])    Melanoma of thigh, right ([place])    Dementia ([place]) 07/14/2017   T12 vertebral fracture ([place]) 06/14/2017   Benign paroxysmal positional vertigo 12/22/2015   Advanced directives, counseling/discussion 11/01/2013   Routine general medical examination at a health care facility 08/11/2011   Type 2 diabetes mellitus with polyneuropathy ([place]) 09/02/2006   HYPERLIPIDEMIA 08/29/2006   Episodic mood disorder ([place]) 08/29/2006    PAIN, CHRONIC NEC 08/29/2006   Essential hypertension, benign 08/29/2006   Osteoporosis 08/29/2006   UTERINE CANCER, HX OF 08/29/2006    Past Surgical History:  Procedure Laterality Date   ABDOMINAL HYSTERECTOMY     BSO 1985  cancer   APPENDECTOMY     1946  Adhesions shortly after   BACK SURGERY  1970's   BLADDER REPAIR     bladder tacking- chronic pain since  1998   CATARACT EXTRACTION  12/12   left and then laser---Dr Tobe Sos   CATARACT EXTRACTION W/PHACO Right 06/07/2016   Procedure: CATARACT EXTRACTION PHACO AND INTRAOCULAR LENS PLACEMENT ([place]);  Surgeon: Birder Robson, MD;  Location: [facility] ORS;  Service: Ophthalmology;  Laterality: Right;  [place] 00:43 AP% 20.6 CDE 8.96 Fluid pack lot # [id number]   CHOLECYSTECTOMY     lysis of adhesions--post op complications, trach, etc  8/10   KIDNEY STONE SURGERY     cysto-- [place] 2003   KYPHOPLASTY N/A 06/15/2017   Procedure: [id number];  Surgeon: Hessie Knows, MD;  Location: [facility] ORS;  Service: Orthopedics;  Laterality: N/A;   MELANOMA EXCISION  10/2017   right thigh, with inguinal node dissection    Prior to Admission medications   Medication Sig Start Date End Date Taking? Authorizing Provider  ACCU-CHEK SOFTCLIX LANCETS lancets USE AS DIRECTED 09/21/17   Venia Carbon, MD  acetaminophen (TYLENOL) 500 MG tablet Take 1 tablet (500 mg total) by mouth 3 (three) times daily. 06/15/17   Vaughan Basta, MD  amLODipine (NORVASC) 10 MG tablet TAKE 1 TABLET(10 MG) BY MOUTH DAILY 04/23/18   Venia Carbon, MD  azithromycin (ZITHROMAX Z-PAK) 250 MG tablet Take 2 tablets (500 mg) on  Day 1,  followed by 1 tablet (250 mg) once daily on Days 2 through 5. 07/09/18   Venia Carbon, MD  cholestyramine (QUESTRAN) 4 g packet MIX AND DRINK 1 PACKET BY MOUTH DAILY AS DIRECTED 03/29/18   Venia Carbon, MD  docusate sodium (COLACE) 100 MG capsule Take 1 capsule (100 mg total) by mouth 2 (two) times daily. 06/15/17    Vaughan Basta, MD  DULoxetine (CYMBALTA) 60 MG capsule TAKE ONE CAPSULE BY MOUTH TWICE DAILY 08/15/18   Viviana Simpler I, MD  FLUoxetine (PROZAC) 40 MG capsule TAKE 1 CAPSULE(40 MG) BY MOUTH DAILY 08/15/18   Viviana Simpler I, MD  glipiZIDE (GLUCOTROL) 5 MG tablet TAKE 1 TABLET BY MOUTH TWICE DAILY BEFORE A MEAL 01/24/17   Viviana Simpler I, MD  glucose blood (ACCU-CHEK GUIDE) test strip Use to check blood sugar once a day. Dx Code K87.68 05/31/18   Viviana Simpler I, MD  losartan (COZAAR) 25 MG tablet TAKE 1 TABLET(25 MG) BY MOUTH DAILY 07/20/18   Venia Carbon, MD  metFORMIN (GLUCOPHAGE-XR) 500 MG 24 hr tablet Take 1 tablet (500 mg total) by mouth daily with breakfast. 06/15/17   Vaughan Basta, MD  Multiple Vitamins-Minerals (MULTIVITAMIN WITH MINERALS) tablet Take 1 tablet by mouth daily.    [provider]  ondansetron (ZOFRAN ODT) 4 MG disintegrating tablet Take 1 tablet (4 mg total) by mouth every 8 (eight) hours as needed for nausea or vomiting. 03/29/17   Paulette Blanch, MD  predniSONE (DELTASONE) 20 MG tablet Take 2 tablets (40 mg total) by mouth daily. 07/09/18   Venia Carbon, MD  propranolol (INDERAL) 20 MG tablet TAKE 1 TABLET BY MOUTH TWICE DAILY 05/01/18   Viviana Simpler I, MD  risedronate (ACTONEL) 150 MG tablet TAKE 1 TABLET BY MOUTH EVERY 30 DAYS. TAKE WITH WATER ON AN EMPTY STOMACH. DO NOT LIE DOWN FOR THE NEXT 30 MINUTES. 08/28/18   Viviana Simpler I, MD  simvastatin (ZOCOR) 20 MG tablet TAKE 1 TABLET(20 MG) BY MOUTH DAILY 12/21/17   Venia Carbon, MD  tiZANidine (ZANAFLEX) 2 MG tablet TAKE 1 TABLET(2 MG) BY MOUTH TWICE DAILY AS NEEDED FOR MUSCLE SPASMS 09/07/18   Venia Carbon, MD  traMADol (ULTRAM) 50 MG tablet TAKE 1 TABLET BY MOUTH THREE TIMES DAILY AS NEEDED 08/24/18   Venia Carbon, MD    Allergies Aspirin, Bisoprolol fumarate, Chlorthalidone, Lisinopril, Sulfa antibiotics, and Tetracyclines & related  Family History  Problem Relation Age  of Onset   Coronary artery disease Father    Hypertension Neg Hx    Diabetes Neg Hx     Social History Social History   Tobacco Use   Smoking status: Current Some Day Smoker   Smokeless tobacco: Never Used   Tobacco comment: only once in a while. Gave info on 1-800-QUIT NOW  Substance Use Topics   Alcohol use: No    Alcohol/week: 0.0 standard drinks   Drug use: No    Review of Systems  Constitutional: No fever/chills Eyes: No visual changes. ENT: No sore throat. Cardiovascular: Denies chest pain. Respiratory: Denies shortness of breath. Gastrointestinal: No abdominal pain.  No nausea, no vomiting.  No diarrhea.  No constipation. Genitourinary: Negative for dysuria. Musculoskeletal: Negative for back pain. Skin: Negative for rash. Neurological: Positive for  seizures. Negative for headaches, focal weakness or numbness.   ____________________________________________   PHYSICAL EXAM:  VITAL SIGNS: ED Triage Vitals  Enc Vitals Group     BP 09/15/18 0227 (!) 172/102     Pulse Rate 09/15/18 0227 83     Resp 09/15/18 0227 (!) 23     Temp 09/15/18 0227 98.2 F (36.8 C)     Temp Source 09/15/18 0227 Axillary     SpO2 09/15/18 0227 99 %     Weight --      Height --      Head Circumference --      Peak Flow --      Pain Score 09/15/18 0228 0     Pain Loc --      Pain Edu? --      Excl. in [place]? --     Constitutional: Unresponsive. Witnessed tonic-clonic seizure en route to treatment room. Eyes: Conjunctivae are normal. PERRL. EOMI. Head: Atraumatic. Nose: Atraumatic. Mouth/Throat: Mucous membranes are moist.  No dental malocclusion.  Edentulous, did not bite tongue. + gag reflex. Neck: No stridor.  Supple neck without meningismus. Cardiovascular: Normal rate, regular rhythm. Grossly normal heart sounds.  Good peripheral circulation. Respiratory: Increased respiratory effort.  No retractions. Lungs CTAB. Gastrointestinal: Soft and nontender. No distention.  No abdominal bruits. No CVA tenderness. Genitourinary: No urinary incontinence. Musculoskeletal: No lower extremity tenderness nor edema.  No joint effusions. Neurologic:  Initially unresponsive but gradually responded to painful stimuli. MAEx4.  Skin:  Skin is warm, dry and intact. No rash noted. Psychiatric: Unable to assess.  ____________________________________________   LABS (all labs ordered are listed, but only abnormal results are displayed)  Labs Reviewed  GLUCOSE, CAPILLARY - Abnormal; Notable for the following components:      Result Value   Glucose-Capillary 189 (*)    All other components within normal limits  LACTIC ACID, PLASMA - Abnormal; Notable for the following components:   Lactic Acid, Venous 3.6 (*)    All other components within normal limits  CBC WITH DIFFERENTIAL/PLATELET - Abnormal; Notable for the following components:   WBC 20.0 (*)    Neutro Abs 12.3 (*)    Lymphs Abs 5.3 (*)    Monocytes Absolute 1.9 (*)    Abs Immature Granulocytes 0.11 (*)    All other components within normal limits  COMPREHENSIVE METABOLIC PANEL - Abnormal; Notable for the following components:   CO2 12 (*)    Glucose, Bld 198 (*)    Creatinine, Ser 1.16 (*)    GFR calc non Af Amer 45 (*)    GFR calc Af Amer 52 (*)    Anion gap 19 (*)    All other components within normal limits  TROPONIN I (HIGH SENSITIVITY) - Abnormal; Notable for the following components:   Troponin I (High Sensitivity) 21 (*)    All other components within normal limits  URINALYSIS, COMPLETE (UACMP) WITH MICROSCOPIC - Abnormal; Notable for the following components:   Color, Urine YELLOW (*)    APPearance HAZY (*)    Glucose, UA 50 (*)    Hgb urine dipstick SMALL (*)    Protein, ur 100 (*)    Nitrite POSITIVE (*)    Bacteria, UA MANY (*)    All other components within normal limits  ACETAMINOPHEN LEVEL - Abnormal; Notable for the following components:   Acetaminophen (Tylenol), Serum <10 (*)    All  other components within normal limits  CULTURE, BLOOD (ROUTINE X 2)  CULTURE,  BLOOD (ROUTINE X 2)  SARS CORONAVIRUS 2 (HOSPITAL ORDER, PERFORMED IN [facility])  URINE CULTURE  LACTIC ACID, PLASMA  ETHANOL  TROPONIN I (HIGH SENSITIVITY)  SALICYLATE LEVEL  PROTIME-INR   ____________________________________________  EKG  ED ECG REPORT I, Krislynn Gronau J, the attending physician, personally viewed and interpreted this ECG.   Date: 09/15/2018  EKG Time: 0221  Rate: 88  Rhythm: normal EKG, normal sinus rhythm  Axis: Normal  Intervals:right bundle branch block  ST&T Change: Nonspecific ____________________________________________  RADIOLOGY  ED MD interpretation:  CT d/w radiologist which demonstrates new mets, left frontal 63m with hemorrhage and surrounding SAH; CXR with bibasilar opacities may represent pna  Official radiology report(s): Ct Head Wo Contrast  Result Date: 09/15/2018 CLINICAL DATA:  78y/o F; new onset seizures. History of metastatic melanoma. EXAM: CT HEAD WITHOUT CONTRAST TECHNIQUE: Contiguous axial images were obtained from the base of the skull through the vertex without intravenous contrast. COMPARISON:  06/12/2017 CT head and MRI head. FINDINGS: Brain: 20 mm acute hemorrhage within the left inferomedial frontal lobe and small volume adjacent subarachnoid hemorrhage (series 2, image 6). 20 mm isodense mass at the periphery of right frontal lobe, favored to be cortical versus extra-axial (series 2, image 20). 12 mm mildly hyperdense mass in the right parietal cortex (series 2, image 15). Stable background advanced chronic microvascular ischemic changes and volume loss of the brain. Vascular: Calcific atherosclerosis of the internal carotid arteries. No hyperdense vessel. Skull: Normal. Negative for fracture or focal lesion. Sinuses/Orbits: No acute finding. Other: None. IMPRESSION: 1. 20 mm acute hemorrhage within the left inferomedial frontal lobe and small  volume adjacent subarachnoid hemorrhage. 2. 20 mm isodense mass in the periphery of right frontal lobe and 12 mm mildly hyperdense mass in the right parietal cortex. Findings likely represent metastatic disease. MRI of the brain without and with contrast is recommended for further characterization. The left inferior frontal hematoma is probably an associated hemorrhagic metastasis. These results were called by telephone at the time of interpretation on 09/15/2018 at 3:46 am to Dr. JLurline Hare, who verbally acknowledged these results. Electronically Signed   By: LKristine GarbeM.D.   On: 09/15/2018 03:48   Dg Chest Port 1 View  Result Date: 09/15/2018 CLINICAL DATA:  New onset seizure. EXAM: PORTABLE CHEST 1 VIEW COMPARISON:  06/13/2017 FINDINGS: Low lung volumes. Normal heart size and mediastinal contours. Heterogeneous bibasilar opacities. No pulmonary edema, large pleural effusion or pneumothorax. No acute osseous abnormalities. IMPRESSION: Heterogeneous bibasilar opacities in the setting of low lung volumes may represent atelectasis, pneumonia, or aspiration. Electronically Signed   By: MKeith RakeM.D.   On: 09/15/2018 02:57    ____________________________________________   PROCEDURES  Procedure(s) performed (including Critical Care):  Procedures  CRITICAL CARE Performed by: SPaulette Blanch  Total critical care time: 60 minutes  Critical care time was exclusive of separately billable procedures and treating other patients.  Critical care was necessary to treat or prevent imminent or life-threatening deterioration.  Critical care was time spent personally by me on the following activities: development of treatment plan with patient and/or surrogate as well as nursing, discussions with consultants, evaluation of patient's response to treatment, examination of patient, obtaining history from patient or surrogate, ordering and performing treatments and interventions, ordering and  review of laboratory studies, ordering and review of radiographic studies, pulse oximetry and re-evaluation of patient's condition. ____________________________________________   INITIAL IMPRESSION / ASSESSMENT AND PLAN / ED COURSE  As part  of my medical decision making, I reviewed the following data within the [place] History obtained from family, Nursing notes reviewed and incorporated, Labs reviewed, EKG interpreted, Old chart reviewed, Radiograph reviewed, Discussed with admitting physician and Notes from prior ED visits     WELLS GERDEMAN was evaluated in Emergency Department on 09/15/2018 for the symptoms described in the history of present illness. She was evaluated in the context of the global COVID-19 pandemic, which necessitated consideration that the patient might be at risk for infection with the SARS-CoV-2 virus that causes COVID-19. Institutional protocols and algorithms that pertain to the evaluation of patients at risk for COVID-19 are in a state of rapid change based on information released by regulatory bodies including the CDC and federal and state organizations. These policies and algorithms were followed during the patient's care in the ED.   78 year old female brought to the ED for new-onset seizures x 2. Differential diagnosis includes but is not limited to [place], CVA, infectious, metabolic etiologies, etc.  Will send patient for urgent CT Head. Obtain toxicological labwork and urine, CXR, Covid swab. Anticipate hospitalization.   Clinical Course as of Sep 15 654  Sat Sep 15, 2018  0356 Patient is awake and answering questions appropriately. MAEx4. Updated her of CT results. I have also called her daughter [name] and updated her as well. Gave phone to patient so she may speak with her daughter. Patient is followed at [facility] for malignant melanoma; will call transfer center for transfer.   [JS]  0423 Patient accepted by Dr. Conley Rolls to [facility] ED. Patient  and daughter updated.   [JS]  [id number] Transport unavailable until after 7am. Updated patient and daughter.   [JS]  [id number] Patient resting in NAD. Remains in the ED awaiting transport to [facility] after 7am.   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   Paulette Blanch, MD 09/15/18 [phone]

## 2018-09-17 LAB — URINE CULTURE: Culture: 30000 — AB

## 2018-09-20 LAB — CULTURE, BLOOD (ROUTINE X 2)
Culture: NO GROWTH
Culture: NO GROWTH
Special Requests: ADEQUATE
Special Requests: ADEQUATE

## 2018-09-24 ENCOUNTER — Telehealth: Payer: Self-pay | Admitting: Internal Medicine

## 2018-09-24 NOTE — Telephone Encounter (Signed)
Last filled 08-24-18 #90 Last OV 07-09-18 No Future OV [facility] [address]

## 2018-09-25 NOTE — Telephone Encounter (Signed)
Please find out what happened with her recent hospital stay (I think she was sent to [facility]). Had seizure and was found to have melanoma to her brain Tramadol increases seizure risk and we probably have to find some alternative. Probably need at least a virtual visit (hopefully with one of children) to review all this

## 2018-09-26 NOTE — Telephone Encounter (Signed)
Left detailed message on VM for daughter, [name]. Advised her that I was going to deny the tramadol refill until we can discuss other options. Asked her to call the office to schedule a virtual visit with Dr Silvio Pate in the next week.

## 2018-09-28 MED ORDER — DULOXETINE HCL 60 MG PO CPEP
60.00 | ORAL_CAPSULE | ORAL | Status: DC
Start: 2018-09-28 — End: 2018-09-28

## 2018-09-28 MED ORDER — FLUOXETINE HCL 20 MG PO CAPS
40.00 | ORAL_CAPSULE | ORAL | Status: DC
Start: 2018-09-29 — End: 2018-09-28

## 2018-09-28 MED ORDER — MELATONIN 3 MG PO TABS
6.00 | ORAL_TABLET | ORAL | Status: DC
Start: 2018-09-28 — End: 2018-09-28

## 2018-09-28 MED ORDER — OXYCODONE HCL 5 MG PO TABS
5.00 | ORAL_TABLET | ORAL | Status: DC
Start: ? — End: 2018-09-28

## 2018-09-28 MED ORDER — CARVEDILOL 12.5 MG PO TABS
12.50 | ORAL_TABLET | ORAL | Status: DC
Start: ? — End: 2018-09-28

## 2018-09-28 MED ORDER — HYDROCHLOROTHIAZIDE 25 MG PO TABS
25.00 | ORAL_TABLET | ORAL | Status: DC
Start: 2018-09-29 — End: 2018-09-28

## 2018-09-28 MED ORDER — INSULIN LISPRO 100 UNIT/ML ~~LOC~~ SOLN
5.00 | SUBCUTANEOUS | Status: DC
Start: 2018-09-29 — End: 2018-09-28

## 2018-09-28 MED ORDER — HYDRALAZINE HCL 20 MG/ML IJ SOLN
10.00 | INTRAMUSCULAR | Status: DC
Start: ? — End: 2018-09-28

## 2018-09-28 MED ORDER — DEXAMETHASONE 2 MG PO TABS
1.00 | ORAL_TABLET | ORAL | Status: DC
Start: 2018-09-28 — End: 2018-09-28

## 2018-09-28 MED ORDER — LIDOCAINE 5 % EX PTCH
1.00 | MEDICATED_PATCH | CUTANEOUS | Status: DC
Start: 2018-09-29 — End: 2018-09-28

## 2018-09-28 MED ORDER — ACETAMINOPHEN 500 MG PO TABS
1000.00 | ORAL_TABLET | ORAL | Status: DC
Start: ? — End: 2018-09-28

## 2018-09-28 MED ORDER — LABETALOL HCL 5 MG/ML IV SOLN
10.00 | INTRAVENOUS | Status: DC
Start: ? — End: 2018-09-28

## 2018-09-28 MED ORDER — LEVETIRACETAM 500 MG PO TABS
1000.00 | ORAL_TABLET | ORAL | Status: DC
Start: 2018-09-28 — End: 2018-09-28

## 2018-09-28 MED ORDER — HYDROXYZINE HCL 25 MG PO TABS
25.00 | ORAL_TABLET | ORAL | Status: DC
Start: ? — End: 2018-09-28

## 2018-09-28 MED ORDER — LORAZEPAM 2 MG/ML IJ SOLN
1.00 | INTRAMUSCULAR | Status: DC
Start: ? — End: 2018-09-28

## 2018-09-28 MED ORDER — AMLODIPINE BESYLATE 10 MG PO TABS
10.00 | ORAL_TABLET | ORAL | Status: DC
Start: 2018-09-29 — End: 2018-09-28

## 2018-09-28 MED ORDER — DEXTROSE 50 % IV SOLN
12.50 | INTRAVENOUS | Status: DC
Start: ? — End: 2018-09-28

## 2018-09-28 MED ORDER — ATORVASTATIN CALCIUM 10 MG PO TABS
10.00 | ORAL_TABLET | ORAL | Status: DC
Start: 2018-09-28 — End: 2018-09-28

## 2018-09-28 MED ORDER — INSULIN LISPRO 100 UNIT/ML ~~LOC~~ SOLN
0.00 | SUBCUTANEOUS | Status: DC
Start: 2018-09-28 — End: 2018-09-28

## 2018-09-28 MED ORDER — INSULIN GLARGINE 100 UNIT/ML ~~LOC~~ SOLN
35.00 | SUBCUTANEOUS | Status: DC
Start: 2018-09-28 — End: 2018-09-28

## 2018-09-28 MED ORDER — GENERIC EXTERNAL MEDICATION
2.00 | Status: DC
Start: 2018-09-28 — End: 2018-09-28

## 2018-09-28 MED ORDER — HYDRALAZINE HCL 50 MG PO TABS
50.00 | ORAL_TABLET | ORAL | Status: DC
Start: 2018-09-28 — End: 2018-09-28

## 2018-09-28 MED ORDER — POLYETHYLENE GLYCOL 3350 17 G PO PACK
17.00 | PACK | ORAL | Status: DC
Start: 2018-09-29 — End: 2018-09-28

## 2018-09-28 MED ORDER — NYSTATIN 100000 UNIT/ML MT SUSP
500000.00 | OROMUCOSAL | Status: DC
Start: 2018-09-28 — End: 2018-09-28

## 2018-09-28 MED ORDER — ONDANSETRON 4 MG PO TBDP
4.00 | ORAL_TABLET | ORAL | Status: DC
Start: ? — End: 2018-09-28

## 2018-09-28 MED ORDER — SIMETHICONE 80 MG PO CHEW
80.00 | CHEWABLE_TABLET | ORAL | Status: DC
Start: ? — End: 2018-09-28

## 2018-09-28 NOTE — Telephone Encounter (Signed)
Patient's daughter called back and stated that the patient is in [facility] with Brain Tumors and will be under going Brain Radiation the next 10 days so would not be able to do a virtual visit.    FYI

## 2018-09-30 NOTE — Telephone Encounter (Signed)
If she is still in the hospital--she doesn't need her refill (may have been initiated by the pharmacy).  Let her daughter know that she should speak to the doctors there about their recommendations for an alternative to tramadol (I don't believe that will be appropriate for her anymore)

## 2018-10-01 ENCOUNTER — Telehealth: Payer: Self-pay

## 2018-10-01 MED ORDER — INSULIN GLARGINE 100 UNIT/ML ~~LOC~~ SOLN
35.00 | SUBCUTANEOUS | Status: DC
Start: 2018-10-12 — End: 2018-10-01

## 2018-10-01 MED ORDER — HYDROXYZINE HCL 25 MG PO TABS
25.00 | ORAL_TABLET | ORAL | Status: DC
Start: ? — End: 2018-10-01

## 2018-10-01 MED ORDER — CARVEDILOL 12.5 MG PO TABS
12.50 | ORAL_TABLET | ORAL | Status: DC
Start: 2018-10-12 — End: 2018-10-01

## 2018-10-01 MED ORDER — HYDRALAZINE HCL 25 MG PO TABS
25.00 | ORAL_TABLET | ORAL | Status: DC
Start: ? — End: 2018-10-01

## 2018-10-01 MED ORDER — GENERIC EXTERNAL MEDICATION
2.00 | Status: DC
Start: 2018-10-12 — End: 2018-10-01

## 2018-10-01 MED ORDER — INSULIN LISPRO 100 UNIT/ML ~~LOC~~ SOLN
5.00 | SUBCUTANEOUS | Status: DC
Start: 2018-10-12 — End: 2018-10-01

## 2018-10-01 MED ORDER — MELATONIN 3 MG PO TABS
6.00 | ORAL_TABLET | ORAL | Status: DC
Start: 2018-10-12 — End: 2018-10-01

## 2018-10-01 MED ORDER — LEVETIRACETAM 500 MG PO TABS
1000.00 | ORAL_TABLET | ORAL | Status: DC
Start: 2018-10-12 — End: 2018-10-01

## 2018-10-01 MED ORDER — DEXTROSE 50 % IV SOLN
12.50 | INTRAVENOUS | Status: DC
Start: ? — End: 2018-10-01

## 2018-10-01 MED ORDER — SIMETHICONE 80 MG PO CHEW
80.00 | CHEWABLE_TABLET | ORAL | Status: DC
Start: ? — End: 2018-10-01

## 2018-10-01 MED ORDER — OXYCODONE HCL 5 MG PO TABS
5.00 | ORAL_TABLET | ORAL | Status: DC
Start: ? — End: 2018-10-01

## 2018-10-01 MED ORDER — LORAZEPAM 2 MG/ML IJ SOLN
1.00 | INTRAMUSCULAR | Status: DC
Start: ? — End: 2018-10-01

## 2018-10-01 MED ORDER — HYDRALAZINE HCL 50 MG PO TABS
50.00 | ORAL_TABLET | ORAL | Status: DC
Start: 2018-10-12 — End: 2018-10-01

## 2018-10-01 MED ORDER — DERMACERIN EX CREA
TOPICAL_CREAM | CUTANEOUS | Status: DC
Start: 2018-10-13 — End: 2018-10-01

## 2018-10-01 MED ORDER — FAMOTIDINE 20 MG PO TABS
20.00 | ORAL_TABLET | ORAL | Status: DC
Start: 2018-10-12 — End: 2018-10-01

## 2018-10-01 MED ORDER — LIDOCAINE 5 % EX PTCH
1.00 | MEDICATED_PATCH | CUTANEOUS | Status: DC
Start: 2018-10-13 — End: 2018-10-01

## 2018-10-01 MED ORDER — NYSTATIN 100000 UNIT/ML MT SUSP
500000.00 | OROMUCOSAL | Status: DC
Start: 2018-10-01 — End: 2018-10-01

## 2018-10-01 MED ORDER — HYDROCHLOROTHIAZIDE 25 MG PO TABS
25.00 | ORAL_TABLET | ORAL | Status: DC
Start: 2018-10-13 — End: 2018-10-01

## 2018-10-01 MED ORDER — FLUOXETINE HCL 20 MG PO CAPS
40.00 | ORAL_CAPSULE | ORAL | Status: DC
Start: 2018-10-13 — End: 2018-10-01

## 2018-10-01 MED ORDER — BISACODYL 5 MG PO TBEC
5.00 | DELAYED_RELEASE_TABLET | ORAL | Status: DC
Start: ? — End: 2018-10-01

## 2018-10-01 MED ORDER — DEXAMETHASONE 0.5 MG PO TABS
1.00 | ORAL_TABLET | ORAL | Status: DC
Start: 2018-10-12 — End: 2018-10-01

## 2018-10-01 MED ORDER — ACETAMINOPHEN 500 MG PO TABS
1000.00 | ORAL_TABLET | ORAL | Status: DC
Start: ? — End: 2018-10-01

## 2018-10-01 MED ORDER — GENERIC EXTERNAL MEDICATION
Status: DC
Start: ? — End: 2018-10-01

## 2018-10-01 MED ORDER — PNEUMOCOCCAL VAC POLYVALENT 25 MCG/0.5ML IJ INJ
0.50 | INJECTION | INTRAMUSCULAR | Status: DC
Start: ? — End: 2018-10-01

## 2018-10-01 MED ORDER — AMLODIPINE BESYLATE 10 MG PO TABS
10.00 | ORAL_TABLET | ORAL | Status: DC
Start: 2018-10-13 — End: 2018-10-01

## 2018-10-01 MED ORDER — ATORVASTATIN CALCIUM 10 MG PO TABS
10.00 | ORAL_TABLET | ORAL | Status: DC
Start: 2018-10-12 — End: 2018-10-01

## 2018-10-01 MED ORDER — ONDANSETRON 4 MG PO TBDP
4.00 | ORAL_TABLET | ORAL | Status: DC
Start: ? — End: 2018-10-01

## 2018-10-01 MED ORDER — DULOXETINE HCL 60 MG PO CPEP
60.00 | ORAL_CAPSULE | ORAL | Status: DC
Start: 2018-10-12 — End: 2018-10-01

## 2018-10-01 MED ORDER — POLYETHYLENE GLYCOL 3350 17 G PO PACK
17.00 | PACK | ORAL | Status: DC
Start: 2018-10-13 — End: 2018-10-01

## 2018-10-01 MED ORDER — INSULIN LISPRO 100 UNIT/ML ~~LOC~~ SOLN
0.00 | SUBCUTANEOUS | Status: DC
Start: 2018-10-12 — End: 2018-10-01

## 2018-10-01 NOTE — Telephone Encounter (Signed)
Spoke to pt's daughter. It was an automatic refill. She said her Mom is now in rehab and will be getting more treatments. They are taking care of her pain medication for her.

## 2018-10-01 NOTE — Telephone Encounter (Signed)
Opened in error

## 2018-10-12 MED ORDER — OXYCODONE HCL 5 MG PO TABS
10.00 | ORAL_TABLET | ORAL | Status: DC
Start: ? — End: 2018-10-12

## 2018-10-12 MED ORDER — CHOLESTYRAMINE 4 G PO PACK
1.00 | PACK | ORAL | Status: DC
Start: 2018-10-13 — End: 2018-10-12

## 2018-10-12 MED ORDER — MENTHOL 9.1 MG MT LOZG
1.00 | LOZENGE | OROMUCOSAL | Status: DC
Start: ? — End: 2018-10-12

## 2018-10-16 ENCOUNTER — Telehealth: Payer: Self-pay | Admitting: Internal Medicine

## 2018-10-16 NOTE — Telephone Encounter (Signed)
Spoke to [place]. I advised her that we have not seen the pt since all of this has happened. We are supposed to see her 10-24-18. She will get with the other providers.

## 2018-10-16 NOTE — Telephone Encounter (Signed)
I would assume that [facility] would have made referral--I don't know who they referred to

## 2018-10-16 NOTE — Telephone Encounter (Signed)
Patient requested physical therapy to start next week.

## 2018-10-18 ENCOUNTER — Telehealth: Payer: Self-pay | Admitting: *Deleted

## 2018-10-18 NOTE — Telephone Encounter (Signed)
Spoke to [employer]. She will let the duaghter know that we would for them to keep the 10-24-18 ov in the office, but if not, we would do it virtually.

## 2018-10-18 NOTE — Telephone Encounter (Signed)
I will be the attending physician. I thought they might consider further Rx but I guess not. Please set up an appt either in office (preferable) or virtual----soon

## 2018-10-18 NOTE — Telephone Encounter (Signed)
Neoma Laming with [facility] left a voicemail stating that patient's oncologist is referring patient to them. Neoma Laming stated that the oncologist is requesting that her PCP be the attending physician. Neoma Laming is requesting a call back to confirm that Dr. Silvio Pate will be the attending physician and she can take a verbal order to start the process.

## 2018-10-19 ENCOUNTER — Telehealth: Payer: Self-pay

## 2018-10-19 NOTE — Telephone Encounter (Signed)
Okay to add on at 1:45 (for 30 minutes) on a Thursday or Friday as available It would be better to visit in person but I can do it virtual if they prefer

## 2018-10-19 NOTE — Telephone Encounter (Signed)
Jaclyn Peterson, patient's daughter, called and cancelled appointment on 10/24/2018 due to her availability. Can patient be rescheduled to a Thursday pm-but not 3 pm or Friday anytime? Also daughter is wondering if this will be virtual visit? Please give daughter a call at [phone].

## 2018-10-24 ENCOUNTER — Ambulatory Visit: Payer: Medicare Other | Admitting: Internal Medicine

## 2018-10-25 NOTE — Telephone Encounter (Signed)
Okay to do virtual Tramadol should be off her list I will discuss transitioning to home visits if they desire

## 2018-10-25 NOTE — Addendum Note (Signed)
Addended by: Pilar Grammes on: 10/25/2018 02:47 PM   Modules accepted: Orders

## 2018-10-25 NOTE — Telephone Encounter (Signed)
Spoke to pt's daughter. Pt is scheduled tomorrow (8-14) for a 15 minute Rehab/Hospital F/U Doxy visit. Advised Dr Silvio Pate.

## 2018-10-25 NOTE — Telephone Encounter (Signed)
Christine DPR signed left v/m that pt has appt on 10/26/18 at 9:15 in office appt but [name] request to be changed to a virtual visit due to pts weakness and hard to get pt out of home. [name] also wanted Dr Silvio Pate to know hospice is coming on 10/26/18 at 3:30 for eval. Pt needs med for pain; pt is not on tramadol. Tramadol is still on med list. [facility] healthcare gave pt Oxycodone 10 mg immediate release taking one q4h po prn for pain when released from hospital 10/12/18.pts CA is spreading. [facility] [address].

## 2018-10-26 ENCOUNTER — Ambulatory Visit (INDEPENDENT_AMBULATORY_CARE_PROVIDER_SITE_OTHER): Payer: Medicare Other | Admitting: Internal Medicine

## 2018-10-26 ENCOUNTER — Encounter: Payer: Self-pay | Admitting: Internal Medicine

## 2018-10-26 DIAGNOSIS — E1142 Type 2 diabetes mellitus with diabetic polyneuropathy: Secondary | ICD-10-CM

## 2018-10-26 DIAGNOSIS — C799 Secondary malignant neoplasm of unspecified site: Secondary | ICD-10-CM | POA: Diagnosis not present

## 2018-10-26 DIAGNOSIS — G40909 Epilepsy, unspecified, not intractable, without status epilepticus: Secondary | ICD-10-CM

## 2018-10-26 DIAGNOSIS — C439 Malignant melanoma of skin, unspecified: Secondary | ICD-10-CM

## 2018-10-26 DIAGNOSIS — G8929 Other chronic pain: Secondary | ICD-10-CM | POA: Diagnosis not present

## 2018-10-26 DIAGNOSIS — F1120 Opioid dependence, uncomplicated: Secondary | ICD-10-CM

## 2018-10-26 DIAGNOSIS — F39 Unspecified mood [affective] disorder: Secondary | ICD-10-CM

## 2018-10-26 MED ORDER — OXYCODONE HCL 10 MG PO TABS: 10.0000 mg | ORAL_TABLET | ORAL | 0 refills | Status: DC | PRN

## 2018-10-26 NOTE — Assessment & Plan Note (Addendum)
Hospital records and recent oncology visit reviewed  Decision made to not pursue further treatment Starting with hospice today I will do home visits from now on

## 2018-10-26 NOTE — Assessment & Plan Note (Signed)
Sugars spiked in hospital with decadron use Will continue lantus/novolog pre meal--but cut back or stop novolog if any hypoglycemia

## 2018-10-26 NOTE — Progress Notes (Signed)
Subjective:    Patient ID: Jaclyn Peterson, female    DOB: Jul 17, 1940, 78 y.o.   MRN: [id number]  HPI Virtual visit after hospitalization for seizures----and then rehab Found to have mets from [facility] RT Saw Dr Harriet Masson and decision made to proceed with hospice  Identification done Reviewed billing and she gave consent She is at home with her daughter facilitating the call, I am in my office  No seizures since the hospitalization  No cough or SOB  Having hip and back pain--he usual Now with leg and hip pain that is worse Pain at spot of melanoma recurrence on back as well Got oxycodone 72m q 4 hours from [facility]--this helps Daughter giving with tylenol  Sugars have been 90's-140 mostly No hypoglycemic  Current Outpatient Medications on File Prior to Visit  Medication Sig Dispense Refill  . ACCU-CHEK SOFTCLIX LANCETS lancets USE AS DIRECTED 100 each 3  . acetaminophen (TYLENOL) 500 MG tablet Take 1 tablet (500 mg total) by mouth 3 (three) times daily. 30 tablet 0  . amLODipine (NORVASC) 10 MG tablet TAKE 1 TABLET(10 MG) BY MOUTH DAILY 90 tablet 3  . carvedilol (COREG) 12.5 MG tablet Take by mouth.    . dexamethasone (DECADRON) 1 MG tablet Take by mouth.    . DULoxetine (CYMBALTA) 60 MG capsule TAKE ONE CAPSULE BY MOUTH TWICE DAILY 180 capsule 1  . famotidine (PEPCID) 20 MG tablet Take by mouth.    .Marland KitchenFLUoxetine (PROZAC) 40 MG capsule TAKE 1 CAPSULE(40 MG) BY MOUTH DAILY 90 capsule 1  . glucose blood (ACCU-CHEK GUIDE) test strip Use to check blood sugar once a day. Dx Code E11.42 100 each 4  . HUMALOG KWIKPEN 100 UNIT/ML KwikPen     . hydrALAZINE (APRESOLINE) 50 MG tablet Take by mouth.    . hydrochlorothiazide (HYDRODIURIL) 25 MG tablet Take by mouth.    . hydrOXYzine (ATARAX/VISTARIL) 25 MG tablet Take by mouth.    . Insulin Glargine (LANTUS SOLOSTAR) 100 UNIT/ML Solostar Pen Inject into the skin.    .Marland KitchenlevETIRAcetam (KEPPRA) 1000 MG tablet Take by mouth.    .  Multiple Vitamins-Minerals (MULTIVITAMIN WITH MINERALS) tablet Take 1 tablet by mouth daily.    . ondansetron (ZOFRAN ODT) 4 MG disintegrating tablet Take 1 tablet (4 mg total) by mouth every 8 (eight) hours as needed for nausea or vomiting. 20 tablet 0  . Oxycodone HCl 10 MG TABS     . Polyethylene Glycol 3350 (PEG 3350) 17 GM/SCOOP POWD Take by mouth.    . propranolol (INDERAL) 20 MG tablet TAKE 1 TABLET BY MOUTH TWICE DAILY 180 tablet 3  . QUESTRAN 4 g packet     . risedronate (ACTONEL) 150 MG tablet TAKE 1 TABLET BY MOUTH EVERY 30 DAYS. TAKE WITH WATER ON AN EMPTY STOMACH. DO NOT LIE DOWN FOR THE NEXT 30 MINUTES. 3 tablet 3  . simvastatin (ZOCOR) 20 MG tablet TAKE 1 TABLET(20 MG) BY MOUTH DAILY 90 tablet 3   No current facility-administered medications on file prior to visit.     Allergies  Allergen Reactions  . Aspirin     Able to take baby aspirin  . Bisoprolol Fumarate     REACTION: ha  . Chlorthalidone   . Lisinopril     REACTION: throat symptoms  . Sulfa Antibiotics     Told by mother as a child  . Tetracyclines & Related Swelling    Throat swelling    Past Medical History:  Diagnosis Date  .  Celiac disease   . Chronic pain   . Depression   . Diabetes mellitus   . Falls frequently    Falls/pneumonia 1/12  . Hyperlipidemia   . Hypertension   . Melanoma of thigh, right ([place])   . Metastatic melanoma ([place])   . Migraines   . Neuropathy   . Osteoporosis   . Pericarditis   . Pneumonia   . SBO (small bowel obstruction) (HCC)    obstipation  10/2005  . 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 Vertigo     Past Surgical History:  Procedure Laterality Date  . ABDOMINAL HYSTERECTOMY     BSO 1985  cancer  . APPENDECTOMY     1946  Adhesions shortly after  . BACK SURGERY  1970's  . BLADDER REPAIR     bladder tacking- chronic pain since  1998  . CATARACT EXTRACTION  12/12   left and then laser---Dr Tobe Sos  . CATARACT EXTRACTION W/PHACO Right 06/07/2016   Procedure:  CATARACT EXTRACTION PHACO AND INTRAOCULAR LENS PLACEMENT (IOC);  Surgeon: Birder Robson, MD;  Location: [facility] ORS;  Service: Ophthalmology;  Laterality: Right;  [place] 00:43 AP% 20.6 CDE 8.96 Fluid pack lot # [id number]  . CHOLECYSTECTOMY     lysis of adhesions--post op complications, trach, etc  8/10  . KIDNEY STONE SURGERY     cysto-- [facility] 2003  . KYPHOPLASTY N/A 06/15/2017   Procedure: [id number];  Surgeon: Hessie Knows, MD;  Location: [facility] ORS;  Service: Orthopedics;  Laterality: N/A;  . MELANOMA EXCISION  10/2017   right thigh, with inguinal node dissection    Family History  Problem Relation Age of Onset  . Coronary artery disease Father   . Hypertension Neg Hx   . Diabetes Neg Hx     Social History   Socioeconomic History  . Marital status: Widowed    Spouse name: Not on file  . Number of children: 2  . Years of education: Not on file  . Highest education level: Not on file  Occupational History  . 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 Financial resource strain: Patient refused  . Food insecurity    Worry: Patient refused    Inability: Patient refused  . Transportation needs    Medical: Patient refused    Non-medical: Patient refused  Tobacco Use  . Smoking status: Current Some Day Smoker  . Smokeless tobacco: Never Used  . Tobacco comment: only once in a while. Gave info on 1-800-QUIT NOW  Substance and Sexual Activity  . Alcohol use: No    Alcohol/week: 0.0 standard drinks  . Drug use: No  . Sexual activity: Not Currently  Lifestyle  . Physical activity    Days per week: Patient refused    Minutes per session: Patient refused  . Stress: Patient refused  Relationships  . Social Herbalist on phone: Patient refused    Gets together: Patient refused    Attends religious service: Patient refused    Active member of club or organization: Patient refused    Attends meetings of clubs or organizations: Patient  refused    Relationship status: Patient refused  . Intimate partner violence    Fear of current or ex partner: Patient refused    Emotionally abused: Patient refused    Physically abused: Patient refused    Forced sexual activity: Patient refused  Other Topics Concern  . Not on file  Social History Narrative  Enjoys crafts      No living will   Daughter, SIL, kids living with her know   Son [name] and daughter [name] should make health care decisions if POA needed   Would accept resuscitation attempts but no prolonged artificial ventilation   No feeding tubes if cognitively unaware         Review of Systems Hearing is just about gone Appetite is fine---unless she sleeps through the meals No weight loss Sleeping a lot--but has some problems at night Daughter notes her brain is "a little foggy"     Objective:   Physical Exam  Constitutional: She appears well-developed. No distress.  Respiratory: Effort normal. No respiratory distress.  Psychiatric:  Fairly upbeat--making jokes about he bald head           Assessment & Plan:

## 2018-10-26 NOTE — Assessment & Plan Note (Signed)
From brain mets None since whole brain RT and keppra

## 2018-10-26 NOTE — Assessment & Plan Note (Signed)
Multiple locations No longer can get tramadol I will refill the oxycodone May need to consider fentanyl patch if frequent need

## 2018-10-26 NOTE — Assessment & Plan Note (Signed)
Chronic anxiety and depression Doing well on fluoxetine--will consider weaning Duloxetine more for diabetic neuropathy--continue

## 2018-10-26 NOTE — Assessment & Plan Note (Signed)
PDMP reviewed.

## 2018-11-02 ENCOUNTER — Telehealth: Payer: Self-pay | Admitting: Internal Medicine

## 2018-11-02 MED ORDER — LORAZEPAM 0.5 MG PO TABS: 0.5000 mg | ORAL_TABLET | ORAL | 1 refills | Status: DC | PRN

## 2018-11-02 NOTE — Telephone Encounter (Signed)
Notified by April RN that she had a very bad night last night Agitated and increased anxiety Hasn't been able to sleep for a couple of nights  Will try prn lorazepam

## 2018-11-05 ENCOUNTER — Telehealth: Payer: Self-pay | Admitting: Internal Medicine

## 2018-11-05 MED ORDER — CEPHALEXIN 500 MG PO CAPS: 500.0000 mg | ORAL_CAPSULE | Freq: Four times a day (QID) | ORAL | 1 refills | Status: DC

## 2018-11-05 NOTE — Telephone Encounter (Signed)
Having more pain Daughter has given her 2 of the oxycodone at times Discussed her issues with substance overuse in the past---would stick with the oxycodone for now  Also with red/blister look to biopsy site Will try cephalexin 500 tid  Will plan home visit on 9/2

## 2018-11-12 ENCOUNTER — Telehealth: Payer: Self-pay

## 2018-11-12 NOTE — Telephone Encounter (Signed)
Jaclyn Peterson (DPR signed) said pt took last Keppra,carvedilol,pepcid,decadron and HCTZ on 11/11/18. Pt will call [facility] nurse about refills and will contact [facility] if needed.

## 2018-11-14 ENCOUNTER — Ambulatory Visit: Payer: Medicare Other | Admitting: Internal Medicine

## 2018-11-14 ENCOUNTER — Encounter: Payer: Self-pay | Admitting: Internal Medicine

## 2018-11-14 VITALS — BP 118/60 | HR 68 | Resp 20

## 2018-11-14 DIAGNOSIS — I10 Essential (primary) hypertension: Secondary | ICD-10-CM

## 2018-11-14 DIAGNOSIS — G40909 Epilepsy, unspecified, not intractable, without status epilepticus: Secondary | ICD-10-CM | POA: Diagnosis not present

## 2018-11-14 DIAGNOSIS — E1142 Type 2 diabetes mellitus with diabetic polyneuropathy: Secondary | ICD-10-CM | POA: Diagnosis not present

## 2018-11-14 DIAGNOSIS — Z23 Encounter for immunization: Secondary | ICD-10-CM

## 2018-11-14 DIAGNOSIS — C439 Malignant melanoma of skin, unspecified: Secondary | ICD-10-CM

## 2018-11-14 DIAGNOSIS — C799 Secondary malignant neoplasm of unspecified site: Secondary | ICD-10-CM | POA: Diagnosis not present

## 2018-11-14 DIAGNOSIS — F0280 Dementia in other diseases classified elsewhere without behavioral disturbance: Secondary | ICD-10-CM | POA: Diagnosis not present

## 2018-11-14 DIAGNOSIS — L02212 Cutaneous abscess of back [any part, except buttock]: Secondary | ICD-10-CM

## 2018-11-14 DIAGNOSIS — E440 Moderate protein-calorie malnutrition: Secondary | ICD-10-CM

## 2018-11-14 MED ORDER — AMOXICILLIN-POT CLAVULANATE 875-125 MG PO TABS: 1.0000 | ORAL_TABLET | Freq: Two times a day (BID) | ORAL | 0 refills | Status: DC

## 2018-11-14 NOTE — Progress Notes (Signed)
Subjective:    Patient ID: Jaclyn Peterson, female    DOB: 09-Oct-1940, 78 y.o.   MRN: [id number]  HPI Initial home visit for this hospice patient--metastatic melanoma Daughter is here April RN--hospice is here  Mostly stays in bed Did get out to park to visit family briefly--wore her out Trouble even getting out of bed now--had been using bedside commode--but last night her legs were about to give out on her  Eating very little Slept almost 24 hours after going to the park Has lost a lot of weight  Sugars are occasionally over 200 Daughter testing her before meals--and giving the humalog only if she ate No clear hypoglycemic reactions  BP trouble in the hospital On new medications No chest pain No palpitations No dizziness  No seizures since hospitalization On keppran  Having back and hip Oxycodone helps a lot though Mostly just bid--morning and night and rarely prn  Significant anxiety and agitation Lorazepam every 4 hours while awake has helped Daughter has found that regular administration has helped more  Biopsy site on right upper back is better with the keflex--but hasn't resolved  Current Outpatient Medications on File Prior to Visit  Medication Sig Dispense Refill  . ACCU-CHEK SOFTCLIX LANCETS lancets USE AS DIRECTED 100 each 3  . amLODipine (NORVASC) 10 MG tablet TAKE 1 TABLET(10 MG) BY MOUTH DAILY 90 tablet 3  . carvedilol (COREG) 12.5 MG tablet Take 12.5 mg by mouth 2 (two) times daily with a meal.    . dexamethasone (DECADRON) 1 MG tablet Take 1 mg by mouth 2 (two) times daily with a meal.    . DULoxetine (CYMBALTA) 60 MG capsule TAKE ONE CAPSULE BY MOUTH TWICE DAILY 180 capsule 1  . famotidine (PEPCID) 20 MG tablet Take 20 mg by mouth 2 (two) times daily.    Marland Kitchen FLUoxetine (PROZAC) 40 MG capsule TAKE 1 CAPSULE(40 MG) BY MOUTH DAILY 90 capsule 1  . glucose blood (ACCU-CHEK GUIDE) test strip Use to check blood sugar once a day. Dx Code E11.42 100  each 4  . hydrALAZINE (APRESOLINE) 50 MG tablet Take 50 mg by mouth 2 (two) times daily.    . hydrochlorothiazide (HYDRODIURIL) 25 MG tablet Take 25 mg by mouth daily.    . hydrOXYzine (ATARAX/VISTARIL) 25 MG tablet Take 25 mg by mouth every 6 (six) hours as needed.    . Insulin Glargine (LANTUS SOLOSTAR) 100 UNIT/ML Solostar Pen Inject 35 Units into the skin at bedtime.    . levETIRAcetam (KEPPRA) 1000 MG tablet Take 1,000 mg by mouth 2 (two) times daily.    Marland Kitchen LORazepam (ATIVAN) 0.5 MG tablet Take 1 tablet (0.5 mg total) by mouth every 4 (four) hours as needed for anxiety. 60 tablet 1  . ondansetron (ZOFRAN ODT) 4 MG disintegrating tablet Take 1 tablet (4 mg total) by mouth every 8 (eight) hours as needed for nausea or vomiting. 20 tablet 0  . Oxycodone HCl 10 MG TABS Take 1 tablet (10 mg total) by mouth every 4 (four) hours as needed. 90 tablet 0   No current facility-administered medications on file prior to visit.     Allergies  Allergen Reactions  . Aspirin     Able to take baby aspirin  . Bisoprolol Fumarate     REACTION: ha  . Chlorthalidone   . Lisinopril     REACTION: throat symptoms  . Sulfa Antibiotics     Told by mother as a child  . Tetracyclines & Related  Swelling    Throat swelling    Past Medical History:  Diagnosis Date  . Celiac disease   . Chronic pain   . Depression   . Diabetes mellitus   . Falls frequently    Falls/pneumonia 1/12  . Hyperlipidemia   . Hypertension   . Melanoma of thigh, right ([place])   . Metastatic melanoma ([place])   . Migraines   . Neuropathy   . Osteoporosis   . Pericarditis   . Pneumonia   . SBO (small bowel obstruction) (HCC)    obstipation  10/2005  . Seizure disorder ([place])   . 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 Vertigo     Past Surgical History:  Procedure Laterality Date  . ABDOMINAL HYSTERECTOMY     BSO 1985  cancer  . APPENDECTOMY     1946  Adhesions shortly after  . BACK SURGERY  1970's  . BLADDER REPAIR      bladder tacking- chronic pain since  1998  . CATARACT EXTRACTION  12/12   left and then laser---Dr Tobe Sos  . CATARACT EXTRACTION W/PHACO Right 06/07/2016   Procedure: CATARACT EXTRACTION PHACO AND INTRAOCULAR LENS PLACEMENT (IOC);  Surgeon: Birder Robson, MD;  Location: [facility] ORS;  Service: Ophthalmology;  Laterality: Right;  [place] 00:43 AP% 20.6 CDE 8.96 Fluid pack lot # [id number]  . CHOLECYSTECTOMY     lysis of adhesions--post op complications, trach, etc  8/10  . KIDNEY STONE SURGERY     cysto-- [facility] 2003  . KYPHOPLASTY N/A 06/15/2017   Procedure: [id number];  Surgeon: Hessie Knows, MD;  Location: [facility] ORS;  Service: Orthopedics;  Laterality: N/A;  . MELANOMA EXCISION  10/2017   right thigh, with inguinal node dissection    Family History  Problem Relation Age of Onset  . Coronary artery disease Father   . Hypertension Neg Hx   . Diabetes Neg Hx     Social History   Socioeconomic History  . Marital status: Widowed    Spouse name: Not on file  . Number of children: 2  . Years of education: Not on file  . Highest education level: Not on file  Occupational History  . 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 Financial resource strain: Patient refused  . Food insecurity    Worry: Patient refused    Inability: Patient refused  . Transportation needs    Medical: Patient refused    Non-medical: Patient refused  Tobacco Use  . Smoking status: Current Some Day Smoker  . Smokeless tobacco: Never Used  . Tobacco comment: only once in a while. Gave info on 1-800-QUIT NOW  Substance and Sexual Activity  . Alcohol use: No    Alcohol/week: 0.0 standard drinks  . Drug use: No  . Sexual activity: Not Currently  Lifestyle  . Physical activity    Days per week: Patient refused    Minutes per session: Patient refused  . Stress: Patient refused  Relationships  . Social Herbalist on phone: Patient refused    Gets together: Patient  refused    Attends religious service: Patient refused    Active member of club or organization: Patient refused    Attends meetings of clubs or organizations: Patient refused    Relationship status: Patient refused  . Intimate partner violence    Fear of current or ex partner: Patient refused    Emotionally abused: Patient refused    Physically abused: Patient  refused    Forced sexual activity: Patient refused  Other Topics Concern  . Not on file  Social History Narrative   Enjoys crafts      No living will   Daughter, SIL, kids living with her know   Son [name] and daughter [name] should make health care decisions if POA needed   Would accept resuscitation attempts but no prolonged artificial ventilation   No feeding tubes if cognitively unaware         Review of Systems Hearing is worse--heard something pop Bowels okay ---getting senna now Voids  Lots of itching--also has spots on arms and legs (?from scratching)    Objective:   Physical Exam  Constitutional: No distress.  Clear wasting In bed--limited bed mobility  Neck: No thyromegaly present.  Cardiovascular: Normal rate, regular rhythm and normal heart sounds. Exam reveals no gallop.  No murmur heard. Respiratory: Effort normal and breath sounds normal. No respiratory distress. She has no wheezes. She has no rales.  GI: Soft. There is no abdominal tenderness.  Musculoskeletal:        General: No edema.  Lymphadenopathy:    She has no cervical adenopathy.  Neurological:  Generalized weakness  Skin:  4cm deep indurated mass in lateral posterior thorax Red/warm/tender  Psychiatric: She has a normal mood and affect.           Assessment & Plan:

## 2018-11-14 NOTE — Assessment & Plan Note (Signed)
To brain Secondary seizures Now on hospice and declining rapidly

## 2018-11-14 NOTE — Assessment & Plan Note (Signed)
Daughter tries to get her to eat

## 2018-11-14 NOTE — Assessment & Plan Note (Signed)
Now mostly just inflamed and indurated Can try heat Will change to augmentin for another week of Rx

## 2018-11-14 NOTE — Assessment & Plan Note (Addendum)
BP Readings from Last 3 Encounters:  11/14/18 118/60  09/15/18 101/74  03/13/18 134/70   Very hard to manage in hospital so meds added Will stop the HCTZ and carvedilol If BP stays low, will also stop the hydralazine

## 2018-11-14 NOTE — Assessment & Plan Note (Signed)
Will stop rapid acting insulin May need to wean the long acting depending on her intake

## 2018-11-14 NOTE — Assessment & Plan Note (Signed)
Clear cognitive issues Some night delirium---discussed using lorazepam prn only instead of every 4 hours regularly

## 2018-11-14 NOTE — Assessment & Plan Note (Signed)
None since hospital, decadron and keppra

## 2018-11-16 NOTE — Addendum Note (Signed)
Addended by: Pilar Grammes on: 11/16/2018 08:51 AM   Modules accepted: Orders

## 2018-11-23 ENCOUNTER — Telehealth: Payer: Self-pay | Admitting: Internal Medicine

## 2018-11-23 MED ORDER — LORAZEPAM 0.5 MG PO TABS: 0.5000 mg | ORAL_TABLET | ORAL | 0 refills | Status: AC | PRN

## 2018-11-23 NOTE — Telephone Encounter (Signed)
Notified by April RN Needs refill on lorazepam Also, having a lot of hallucinations in past 2 nights--wonders about haldol Spot on back from biopsy is better but still red--is going to try lidocaine patches  Will consider haldol 0.19m at bedtime if hallucinations worsen

## 2018-11-28 ENCOUNTER — Telehealth: Payer: Self-pay | Admitting: Internal Medicine

## 2018-11-28 MED ORDER — OXYCODONE HCL 10 MG PO TABS: 10.0000 mg | ORAL_TABLET | ORAL | 0 refills | Status: AC | PRN

## 2018-11-28 MED ORDER — CLINDAMYCIN HCL 300 MG PO CAPS: 300.0000 mg | ORAL_CAPSULE | Freq: Three times a day (TID) | ORAL | 1 refills | Status: AC

## 2018-11-28 NOTE — Telephone Encounter (Signed)
Not doing well Not eating well----now off insulin BP running lower --so hydralazine cut to bid. I instructed her to stop altogether  Region on back is worse--will try clindamycin

## 2018-12-11 ENCOUNTER — Telehealth: Payer: Self-pay | Admitting: Internal Medicine

## 2018-12-11 MED ORDER — MORPHINE SULFATE (CONCENTRATE) 10 MG /0.5 ML PO SOLN: 5.0000 mg | ORAL | 0 refills | Status: AC | PRN

## 2018-12-11 NOTE — Telephone Encounter (Signed)
PC with April RN from hospice Seems to be transitioning---more pain, etc Not eating much Okay to start roxanol for prn use

## 2018-12-13 ENCOUNTER — Other Ambulatory Visit: Payer: Self-pay | Admitting: Internal Medicine

## 2018-12-17 ENCOUNTER — Telehealth: Payer: Self-pay | Admitting: Internal Medicine

## 2018-12-18 NOTE — Telephone Encounter (Signed)
Message left this morning---will try to reach the daughter again to pass on condolences

## 2019-01-13 NOTE — Telephone Encounter (Signed)
Jaclyn Peterson with hospice care called today @ 3:24pm letting you know that the patient passed away today  Aprils C/B # [phone]

## 2019-01-13 DEATH — deceased
